# Patient Record
Sex: Female | Born: 1965 | Race: White | Hispanic: No | Marital: Married | Smoking: Never smoker
Health system: Southern US, Community
[De-identification: ages and names within clinical notes are randomized; demographics above are authoritative.]

## PROBLEM LIST (undated history)

## (undated) DIAGNOSIS — F419 Anxiety disorder, unspecified: Secondary | ICD-10-CM

## (undated) DIAGNOSIS — E119 Type 2 diabetes mellitus without complications: Secondary | ICD-10-CM

## (undated) DIAGNOSIS — L409 Psoriasis, unspecified: Secondary | ICD-10-CM

## (undated) DIAGNOSIS — K219 Gastro-esophageal reflux disease without esophagitis: Secondary | ICD-10-CM

## (undated) DIAGNOSIS — F32A Depression, unspecified: Secondary | ICD-10-CM

## (undated) DIAGNOSIS — F329 Major depressive disorder, single episode, unspecified: Secondary | ICD-10-CM

## (undated) HISTORY — DX: Type 2 diabetes mellitus without complications: E11.9

## (undated) HISTORY — PX: TUBAL LIGATION: SHX77

## (undated) HISTORY — DX: Depression, unspecified: F32.A

## (undated) HISTORY — PX: TONSILLECTOMY: SUR1361

## (undated) HISTORY — DX: Psoriasis, unspecified: L40.9

## (undated) HISTORY — PX: ENDOMETRIAL ABLATION: SHX621

## (undated) HISTORY — PX: CARPAL TUNNEL RELEASE: SHX101

## (undated) HISTORY — DX: Major depressive disorder, single episode, unspecified: F32.9

---
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
## 2005-08-02 ENCOUNTER — Ambulatory Visit: Payer: Self-pay

## 2006-08-14 ENCOUNTER — Ambulatory Visit: Payer: Self-pay

## 2007-11-07 ENCOUNTER — Ambulatory Visit: Payer: Self-pay | Admitting: Internal Medicine

## 2007-11-14 ENCOUNTER — Ambulatory Visit: Payer: Self-pay | Admitting: Internal Medicine

## 2007-12-06 ENCOUNTER — Other Ambulatory Visit: Payer: Self-pay

## 2007-12-06 ENCOUNTER — Emergency Department: Payer: Self-pay | Admitting: Emergency Medicine

## 2007-12-08 ENCOUNTER — Ambulatory Visit: Payer: Self-pay | Admitting: Internal Medicine

## 2008-01-07 ENCOUNTER — Ambulatory Visit: Payer: Self-pay | Admitting: Internal Medicine

## 2008-02-07 ENCOUNTER — Ambulatory Visit: Payer: Self-pay | Admitting: Internal Medicine

## 2008-03-09 ENCOUNTER — Ambulatory Visit: Payer: Self-pay | Admitting: Internal Medicine

## 2008-04-08 ENCOUNTER — Ambulatory Visit: Payer: Self-pay | Admitting: Internal Medicine

## 2008-05-09 ENCOUNTER — Ambulatory Visit: Payer: Self-pay | Admitting: Internal Medicine

## 2008-07-09 ENCOUNTER — Ambulatory Visit: Payer: Self-pay | Admitting: Internal Medicine

## 2008-07-26 ENCOUNTER — Ambulatory Visit: Payer: Self-pay | Admitting: Internal Medicine

## 2008-08-09 ENCOUNTER — Ambulatory Visit: Payer: Self-pay | Admitting: Internal Medicine

## 2008-09-06 ENCOUNTER — Ambulatory Visit: Payer: Self-pay | Admitting: Internal Medicine

## 2008-10-27 ENCOUNTER — Ambulatory Visit: Payer: Self-pay

## 2008-12-07 ENCOUNTER — Ambulatory Visit: Payer: Self-pay | Admitting: Internal Medicine

## 2008-12-14 ENCOUNTER — Ambulatory Visit: Payer: Self-pay | Admitting: Internal Medicine

## 2009-01-06 ENCOUNTER — Ambulatory Visit: Payer: Self-pay | Admitting: Internal Medicine

## 2009-03-09 ENCOUNTER — Ambulatory Visit: Payer: Self-pay | Admitting: Internal Medicine

## 2009-03-25 ENCOUNTER — Ambulatory Visit: Payer: Self-pay | Admitting: Internal Medicine

## 2009-04-08 ENCOUNTER — Ambulatory Visit: Payer: Self-pay | Admitting: Internal Medicine

## 2009-06-08 ENCOUNTER — Ambulatory Visit: Payer: Self-pay | Admitting: Internal Medicine

## 2009-06-17 ENCOUNTER — Ambulatory Visit: Payer: Self-pay | Admitting: Internal Medicine

## 2009-07-09 ENCOUNTER — Ambulatory Visit: Payer: Self-pay | Admitting: Internal Medicine

## 2009-08-09 ENCOUNTER — Ambulatory Visit: Payer: Self-pay | Admitting: Internal Medicine

## 2009-11-01 ENCOUNTER — Ambulatory Visit: Payer: Self-pay

## 2009-11-06 ENCOUNTER — Ambulatory Visit: Payer: Self-pay | Admitting: Internal Medicine

## 2009-11-22 ENCOUNTER — Ambulatory Visit: Payer: Self-pay | Admitting: Internal Medicine

## 2010-04-22 ENCOUNTER — Emergency Department: Payer: Self-pay | Admitting: Emergency Medicine

## 2010-05-22 ENCOUNTER — Ambulatory Visit: Payer: Self-pay | Admitting: Internal Medicine

## 2010-06-08 ENCOUNTER — Ambulatory Visit: Payer: Self-pay | Admitting: Internal Medicine

## 2010-07-09 HISTORY — PX: LAPAROSCOPIC VAGINAL HYSTERECTOMY WITH SALPINGO OOPHORECTOMY: SHX6681

## 2010-07-09 HISTORY — PX: COLPOSCOPY: SHX161

## 2011-03-21 ENCOUNTER — Ambulatory Visit: Payer: Self-pay | Admitting: Internal Medicine

## 2011-03-21 ENCOUNTER — Ambulatory Visit: Payer: Self-pay

## 2011-03-27 ENCOUNTER — Ambulatory Visit: Payer: Self-pay | Admitting: Unknown Physician Specialty

## 2011-03-29 ENCOUNTER — Ambulatory Visit: Payer: Self-pay | Admitting: Unknown Physician Specialty

## 2011-04-03 ENCOUNTER — Ambulatory Visit: Payer: Self-pay

## 2011-04-05 LAB — PATHOLOGY REPORT

## 2011-04-09 ENCOUNTER — Ambulatory Visit: Payer: Self-pay | Admitting: Internal Medicine

## 2011-05-10 ENCOUNTER — Ambulatory Visit: Payer: Self-pay | Admitting: Internal Medicine

## 2011-05-29 ENCOUNTER — Ambulatory Visit: Payer: Self-pay

## 2011-06-05 ENCOUNTER — Inpatient Hospital Stay: Payer: Self-pay

## 2011-06-07 LAB — PATHOLOGY REPORT

## 2011-09-27 ENCOUNTER — Ambulatory Visit: Payer: Self-pay | Admitting: Unknown Physician Specialty

## 2016-01-27 ENCOUNTER — Other Ambulatory Visit: Payer: Self-pay | Admitting: Obstetrics & Gynecology

## 2016-01-27 DIAGNOSIS — Z1231 Encounter for screening mammogram for malignant neoplasm of breast: Secondary | ICD-10-CM

## 2016-02-09 ENCOUNTER — Ambulatory Visit: Payer: Self-pay

## 2016-02-21 ENCOUNTER — Other Ambulatory Visit: Payer: Self-pay | Admitting: Obstetrics & Gynecology

## 2016-02-21 ENCOUNTER — Ambulatory Visit
Admission: RE | Admit: 2016-02-21 | Discharge: 2016-02-21 | Disposition: A | Discharge status: Home / Self Care | Payer: Managed Care, Other (non HMO) | Source: Ambulatory Visit | Attending: Obstetrics & Gynecology | Admitting: Obstetrics & Gynecology

## 2016-02-21 DIAGNOSIS — Z1231 Encounter for screening mammogram for malignant neoplasm of breast: Secondary | ICD-10-CM | POA: Insufficient documentation

## 2016-12-31 ENCOUNTER — Other Ambulatory Visit: Payer: Self-pay | Admitting: Obstetrics and Gynecology

## 2016-12-31 ENCOUNTER — Other Ambulatory Visit: Payer: Self-pay | Admitting: Obstetrics & Gynecology

## 2016-12-31 DIAGNOSIS — Z1231 Encounter for screening mammogram for malignant neoplasm of breast: Secondary | ICD-10-CM

## 2017-01-23 ENCOUNTER — Ambulatory Visit (INDEPENDENT_AMBULATORY_CARE_PROVIDER_SITE_OTHER): Payer: Managed Care, Other (non HMO) | Admitting: Obstetrics and Gynecology

## 2017-01-23 ENCOUNTER — Encounter: Payer: Self-pay | Admitting: Obstetrics and Gynecology

## 2017-01-23 VITALS — BP 110/76 | HR 76 | Ht 61.0 in | Wt 171.0 lb

## 2017-01-23 DIAGNOSIS — Z124 Encounter for screening for malignant neoplasm of cervix: Secondary | ICD-10-CM | POA: Diagnosis not present

## 2017-01-23 DIAGNOSIS — Z01419 Encounter for gynecological examination (general) (routine) without abnormal findings: Secondary | ICD-10-CM

## 2017-01-23 DIAGNOSIS — Z1211 Encounter for screening for malignant neoplasm of colon: Secondary | ICD-10-CM

## 2017-01-23 DIAGNOSIS — Z1239 Encounter for other screening for malignant neoplasm of breast: Secondary | ICD-10-CM

## 2017-01-23 DIAGNOSIS — Z1231 Encounter for screening mammogram for malignant neoplasm of breast: Secondary | ICD-10-CM | POA: Diagnosis not present

## 2017-01-23 MED ORDER — ESTROGENS, CONJUGATED 0.625 MG/GM VA CREA: 1.0000 | TOPICAL_CREAM | VAGINAL | 3 refills | Status: DC

## 2017-01-23 NOTE — Progress Notes (Addendum)
Patient ID: Kristine Stephens, female   DOB: Apr 02, 1966, 51 y.o.   MRN: [id number]     Gynecology Annual Exam  PCP: System, Pcp Not In  Chief Complaint:  Chief Complaint  Patient presents with  . Gynecologic Exam    History of Present Illness:Patient is a 51 y.o. G3P3 presents for annual exam. The patient has no complaints today.   LMP: No LMP recorded. Patient has had a hysterectomy.  The patient is sexually active. She denies dyspareunia.  The patient does perform self breast exams.  There is no notable family history of breast or ovarian cancer in her family.  The patient wears seatbelts: yes.   The patient has regular exercise: not asked.    The patient denies current symptoms of depression.     Review of Systems: Review of Systems  Constitutional: Negative for chills and fever.  HENT: Negative for congestion.   Respiratory: Negative for cough and shortness of breath.   Cardiovascular: Negative for chest pain and palpitations.  Gastrointestinal: Negative for abdominal pain, constipation, diarrhea, heartburn, nausea and vomiting.  Genitourinary: Negative for dysuria, frequency and urgency.  Skin: Negative for itching and rash.  Neurological: Negative for dizziness and headaches.  Endo/Heme/Allergies: Negative for polydipsia.  Psychiatric/Behavioral: Negative for depression.    Past Medical History:  History reviewed. No pertinent past medical history.  Past Surgical History:  Past Surgical History:  Procedure Laterality Date  . ABDOMINAL HYSTERECTOMY    . ASD REPAIR  1985  . CARPAL TUNNEL RELEASE    . CESAREAN SECTION      Gynecologic History:  No LMP recorded. Patient has had a hysterectomy. Last Pap: Results were:01/20/2016 ASCUS with POSITIVE high risk HPV  Colposcopy negative Last mammogram: 02/21/2016 Results were: BI-RAD I Obstetric History: G3P3  Family History:  Family History  Problem Relation Age of Onset  . Breast cancer Paternal Grandmother      Social History:  Social History   Social History  . Marital status: Married    Spouse name: N/A  . Number of children: N/A  . Years of education: N/A   Occupational History  . Not on file.   Social History Main Topics  . Smoking status: Never Smoker  . Smokeless tobacco: Never Used  . Alcohol use No  . Drug use: No  . Sexual activity: Yes    Partners: Male    Birth control/ protection: None   Other Topics Concern  . Not on file   Social History Narrative  . No narrative on file    Allergies:  No Known Allergies  Medications: Prior to Admission medications   Medication Sig Start Date End Date Taking? Authorizing Provider  Cholecalciferol (VITAMIN D-1000 MAX ST) 1000 units tablet Take by mouth.   Yes [provider]  clonazePAM (KLONOPIN) 0.5 MG tablet TK 1 T PO QHS PRF INSOMNIA 12/23/16  Yes [provider]  FLUoxetine (PROZAC) 20 MG capsule Take by mouth.   Yes [provider]  folic acid (FOLVITE) 1 MG tablet  01/22/17  Yes [provider]  methotrexate 50 MG/2ML injection Inject into the skin. 10/30/16  Yes [provider]  naproxen sodium (ANAPROX) 220 MG tablet Take by mouth.   Yes [provider]  omeprazole (PRILOSEC) 20 MG capsule TK ONE C PO  ONE TO TWO TIMES D 11/06/16  Yes [provider]    Physical Exam Vitals: Blood pressure 110/76, pulse 76, height 5\' 1"  (1.549 m), weight 171 lb (77.6  kg).  General: NAD HEENT: normocephalic, anicteric Thyroid: no enlargement, no palpable nodules Pulmonary: No increased work of breathing, CTAB Cardiovascular: RRR, distal pulses 2+ Breast: Breast symmetrical, no tenderness, no palpable nodules or masses, no skin or nipple retraction present, no nipple discharge.  No axillary or supraclavicular lymphadenopathy. Abdomen: NABS, soft, non-tender, non-distended.  Umbilicus without lesions.  No hepatomegaly, splenomegaly or masses palpable. No evidence of hernia   Genitourinary:  External: Normal external female genitalia.  Normal urethral meatus, normal  Bartholin's and Skene's glands.    Vagina: Normal vaginal mucosa, no evidence of prolapse.    Cervix: Grossly normal in appearance, no bleeding  Uterus: surgically absent  Adnexa: ovaries non-enlarged, no adnexal masses  Rectal: deferred  Lymphatic: no evidence of inguinal lymphadenopathy Extremities: no edema, erythema, or tenderness Neurologic: Grossly intact Psychiatric: mood appropriate, affect full  Female chaperone present for pelvic and breast  portions of the physical exam    Assessment: 51 y.o. G3P3 No problem-specific Assessment & Plan notes found for this encounter.   Plan: Problem List Items Addressed This Visit    None    Visit Diagnoses    Breast screening    -  Primary   Relevant Orders   MM DIGITAL SCREENING BILATERAL   Screening for malignant neoplasm of cervix       Relevant Orders   PapIG, HPV, rfx 16/18   Encounter for gynecological examination without abnormal finding       Relevant Orders   PapIG, HPV, rfx 16/18   Colon cancer screening       Relevant Orders   Ambulatory referral to Gastroenterology      1) Mammogram - recommend yearly screening mammogram.  Mammogram Was ordered today  2) STI screening was not offered  3) ASCCP guidelines and rational discussed.  Patient opts for every 3 years screening interval - supracervical hysterectomy, BSO for AUB adenomyosis on final path  4) Osteoporosis  - per USPTF routine screening DEXA at age 51  5) Routine healthcare maintenance including cholesterol, diabetes screening discussed managed by PCP  6) Colonoscopy  Screening recommended starting at age 51 for average risk individuals, age 51 for individuals deemed at increased risk (including African Americans) and recommended to continue until age 51.  For patient age 51-51 individualized approach is recommended.  Gold standard screening is via colonoscopy,  Cologuard screening is an acceptable alternative for patient unwilling or unable to undergo colonoscopy.  "Colorectal cancer screening for average?risk adults: 2018 guideline update from the American Cancer Society"CA: A Cancer Journal for Clinicians: Dec 05, 2016  - ordered  7) Follow up 1 year for routine annual

## 2017-01-23 NOTE — Patient Instructions (Signed)
Preventive Care 40-64 Years, Female Preventive care refers to lifestyle choices and visits with your health care provider that can promote health and wellness. What does preventive care include?  A yearly physical exam. This is also called an annual well check.  Dental exams once or twice a year.  Routine eye exams. Ask your health care provider how often you should have your eyes checked.  Personal lifestyle choices, including: ? Daily care of your teeth and gums. ? Regular physical activity. ? Eating a healthy diet. ? Avoiding tobacco and drug use. ? Limiting alcohol use. ? Practicing safe sex. ? Taking low-dose aspirin daily starting at age 58. ? Taking vitamin and mineral supplements as recommended by your health care provider. What happens during an annual well check? The services and screenings done by your health care provider during your annual well check will depend on your age, overall health, lifestyle risk factors, and family history of disease. Counseling Your health care provider may ask you questions about your:  Alcohol use.  Tobacco use.  Drug use.  Emotional well-being.  Home and relationship well-being.  Sexual activity.  Eating habits.  Work and work Statistician.  Method of birth control.  Menstrual cycle.  Pregnancy history.  Screening You may have the following tests or measurements:  Height, weight, and BMI.  Blood pressure.  Lipid and cholesterol levels. These may be checked every 5 years, or more frequently if you are over 81 years old.  Skin check.  Lung cancer screening. You may have this screening every year starting at age 78 if you have a 30-pack-year history of smoking and currently smoke or have quit within the past 15 years.  Fecal occult blood test (FOBT) of the stool. You may have this test every year starting at age 65.  Flexible sigmoidoscopy or colonoscopy. You may have a sigmoidoscopy every 5 years or a colonoscopy  every 10 years starting at age 30.  Hepatitis C blood test.  Hepatitis B blood test.  Sexually transmitted disease (STD) testing.  Diabetes screening. This is done by checking your blood sugar (glucose) after you have not eaten for a while (fasting). You may have this done every 1-3 years.  Mammogram. This may be done every 1-2 years. Talk to your health care provider about when you should start having regular mammograms. This may depend on whether you have a family history of breast cancer.  BRCA-related cancer screening. This may be done if you have a family history of breast, ovarian, tubal, or peritoneal cancers.  Pelvic exam and Pap test. This may be done every 3 years starting at age 80. Starting at age 36, this may be done every 5 years if you have a Pap test in combination with an HPV test.  Bone density scan. This is done to screen for osteoporosis. You may have this scan if you are at high risk for osteoporosis.  Discuss your test results, treatment options, and if necessary, the need for more tests with your health care provider. Vaccines Your health care provider may recommend certain vaccines, such as:  Influenza vaccine. This is recommended every year.  Tetanus, diphtheria, and acellular pertussis (Tdap, Td) vaccine. You may need a Td booster every 10 years.  Varicella vaccine. You may need this if you have not been vaccinated.  Zoster vaccine. You may need this after age 5.  Measles, mumps, and rubella (MMR) vaccine. You may need at least one dose of MMR if you were born in  1957 or later. You may also need a second dose.  Pneumococcal 13-valent conjugate (PCV13) vaccine. You may need this if you have certain conditions and were not previously vaccinated.  Pneumococcal polysaccharide (PPSV23) vaccine. You may need one or two doses if you smoke cigarettes or if you have certain conditions.  Meningococcal vaccine. You may need this if you have certain  conditions.  Hepatitis A vaccine. You may need this if you have certain conditions or if you travel or work in places where you may be exposed to hepatitis A.  Hepatitis B vaccine. You may need this if you have certain conditions or if you travel or work in places where you may be exposed to hepatitis B.  Haemophilus influenzae type b (Hib) vaccine. You may need this if you have certain conditions.  Talk to your health care provider about which screenings and vaccines you need and how often you need them. This information is not intended to replace advice given to you by your health care provider. Make sure you discuss any questions you have with your health care provider. Document Released: 07/22/2015 Document Revised: 03/14/2016 Document Reviewed: 04/26/2015 Elsevier Interactive Patient Education  2017 [employer].

## 2017-01-30 LAB — PAPIG, HPV, RFX 16/18
HPV Genotype, 16: NEGATIVE
HPV Genotype, 18: NEGATIVE
HPV, high-risk: POSITIVE — AB
PAP Smear Comment: 0

## 2017-02-04 ENCOUNTER — Telehealth: Payer: Self-pay | Admitting: Obstetrics and Gynecology

## 2017-02-04 NOTE — Telephone Encounter (Signed)
Pt is returning call from DR. Staebler . Please advise

## 2017-02-04 NOTE — Telephone Encounter (Signed)
Please call.

## 2017-02-04 NOTE — Telephone Encounter (Signed)
Unable to reach left voicemail to call office at [phone]

## 2017-02-05 NOTE — Telephone Encounter (Signed)
Pt is returning Missed call

## 2017-02-06 ENCOUNTER — Telehealth: Payer: Self-pay | Admitting: Obstetrics and Gynecology

## 2017-02-06 NOTE — Telephone Encounter (Signed)
-----   Message from Malachy Mood, MD sent at 02/05/2017  4:47 PM EDT ----- Regarding: Colposcopy Needs colposcopy in the next 1-6 weeks

## 2017-02-06 NOTE — Telephone Encounter (Signed)
Pt is schedule 03/15/17

## 2017-02-07 ENCOUNTER — Telehealth: Payer: Self-pay

## 2017-02-07 ENCOUNTER — Other Ambulatory Visit: Payer: Self-pay

## 2017-02-07 DIAGNOSIS — Z1211 Encounter for screening for malignant neoplasm of colon: Secondary | ICD-10-CM

## 2017-02-07 NOTE — Telephone Encounter (Signed)
Gastroenterology Pre-Procedure Review  Request Date: 04/11/17 Requesting Physician: Dr. Vicente Males  PATIENT REVIEW QUESTIONS: The patient responded to the following health history questions as indicated:    1. Are you having any GI issues? no 2. Do you have a personal history of Polyps? no 3. Do you have a family history of Colon Cancer or Polyps? no 4. Diabetes Mellitus? no 5. Joint replacements in the past 12 months?no 6. Major health problems in the past 3 months?no 7. Any artificial heart valves, MVP, or defibrillator?no    MEDICATIONS & ALLERGIES:    Patient reports the following regarding taking any anticoagulation/antiplatelet therapy:   Plavix, Coumadin, Eliquis, Xarelto, Lovenox, Pradaxa, Brilinta, or Effient? no Aspirin? no  Patient confirms/reports the following medications:  Current Outpatient Prescriptions  Medication Sig Dispense Refill  . Cholecalciferol (VITAMIN D-1000 MAX ST) 1000 units tablet Take by mouth.    . clonazePAM (KLONOPIN) 0.5 MG tablet TK 1 T PO QHS PRF INSOMNIA  3  . conjugated estrogens (PREMARIN) vaginal cream Place 1 Applicatorful vaginally 2 (two) times a week. 30 g 3  . FLUoxetine (PROZAC) 20 MG capsule Take by mouth.    . folic acid (FOLVITE) 1 MG tablet     . methotrexate 50 MG/2ML injection Inject into the skin.    . naproxen sodium (ANAPROX) 220 MG tablet Take by mouth.    Marland Kitchen omeprazole (PRILOSEC) 20 MG capsule TK ONE C PO  ONE TO TWO TIMES D  2   No current facility-administered medications for this visit.     Patient confirms/reports the following allergies:  No Known Allergies  No orders of the defined types were placed in this encounter.   AUTHORIZATION INFORMATION Primary Insurance: 1D#: Group #:  Secondary Insurance: 1D#: Group #:  SCHEDULE INFORMATION: Date: 04/11/17 Time: Location:[facility]

## 2017-02-21 ENCOUNTER — Ambulatory Visit
Admission: RE | Admit: 2017-02-21 | Discharge: 2017-02-21 | Disposition: A | Discharge status: Home / Self Care | Payer: Managed Care, Other (non HMO) | Source: Ambulatory Visit | Attending: Obstetrics and Gynecology | Admitting: Obstetrics and Gynecology

## 2017-02-21 DIAGNOSIS — Z1231 Encounter for screening mammogram for malignant neoplasm of breast: Secondary | ICD-10-CM | POA: Insufficient documentation

## 2017-03-15 ENCOUNTER — Ambulatory Visit (INDEPENDENT_AMBULATORY_CARE_PROVIDER_SITE_OTHER): Payer: Managed Care, Other (non HMO) | Admitting: Obstetrics and Gynecology

## 2017-03-15 ENCOUNTER — Encounter: Payer: Self-pay | Admitting: Obstetrics and Gynecology

## 2017-03-15 VITALS — BP 116/70 | HR 79 | Wt 170.0 lb

## 2017-03-15 DIAGNOSIS — R87810 Cervical high risk human papillomavirus (HPV) DNA test positive: Secondary | ICD-10-CM

## 2017-03-15 NOTE — Progress Notes (Signed)
   GYNECOLOGY CLINIC COLPOSCOPY PROCEDURE NOTE  51 y.o. G3P3 here for colposcopy for normal cytology positive HPV pap smear on 01/2017. Discussed underlying role for HPV infection in the development of cervical dysplasia, its natural history and progression/regression, need for surveillance.  Is the patient  pregnant: No LMP: No LMP recorded. Patient has had a hysterectomy. Smoking status:  Metrics: Intervention Frequency ACO  Documented Smoking Status Yearly  Screened one or more times in 24 months  Cessation Counseling or  Active cessation medication Past 24 months  Past 24 months   Guideline developer: UpToDate (See UpToDate for funding source) Date Released: 2014  Patient given informed consent, signed copy in the chart, time out was performed.  The patient was position in dorsal lithotomy position. Speculum was placed the cervix was visualized.   After application of acetic acid colposcopic inspection of the cervix was undertaken.   Colposcopy adequate, full visualization of transformation zone: Yes no visible lesions; corresponding biopsies obtained.   ECC specimen obtained:  No  All specimens were labeled and sent to pathology.   Patient was given post procedure instructions.  Will follow up pathology and manage accordingly.  Routine preventative health maintenance measures emphasized.  OBGyn Exam  Malachy Mood, MD, [facility], [facility]

## 2017-03-19 LAB — PATHOLOGY

## 2017-03-27 ENCOUNTER — Telehealth: Payer: Self-pay | Admitting: Obstetrics and Gynecology

## 2017-03-27 NOTE — Telephone Encounter (Signed)
Please call back

## 2017-03-27 NOTE — Telephone Encounter (Signed)
Pt is calling due to missed call about her Colpo results. Please advise cb# [phone]

## 2017-04-11 ENCOUNTER — Encounter
Admission: RE | Disposition: A | Discharge status: Home / Self Care | Payer: Self-pay | Source: Ambulatory Visit | Attending: Gastroenterology

## 2017-04-11 ENCOUNTER — Ambulatory Visit
Admission: RE | Admit: 2017-04-11 | Discharge: 2017-04-11 | Disposition: A | Discharge status: Home / Self Care | Payer: Managed Care, Other (non HMO) | Source: Ambulatory Visit | Attending: Gastroenterology | Admitting: Gastroenterology

## 2017-04-11 ENCOUNTER — Ambulatory Visit: Payer: Managed Care, Other (non HMO) | Admitting: Anesthesiology

## 2017-04-11 ENCOUNTER — Encounter: Payer: Self-pay | Admitting: Anesthesiology

## 2017-04-11 DIAGNOSIS — F419 Anxiety disorder, unspecified: Secondary | ICD-10-CM | POA: Insufficient documentation

## 2017-04-11 DIAGNOSIS — Z1211 Encounter for screening for malignant neoplasm of colon: Secondary | ICD-10-CM | POA: Insufficient documentation

## 2017-04-11 DIAGNOSIS — K640 First degree hemorrhoids: Secondary | ICD-10-CM | POA: Diagnosis not present

## 2017-04-11 DIAGNOSIS — Z791 Long term (current) use of non-steroidal anti-inflammatories (NSAID): Secondary | ICD-10-CM | POA: Insufficient documentation

## 2017-04-11 DIAGNOSIS — K219 Gastro-esophageal reflux disease without esophagitis: Secondary | ICD-10-CM | POA: Insufficient documentation

## 2017-04-11 DIAGNOSIS — K635 Polyp of colon: Secondary | ICD-10-CM | POA: Insufficient documentation

## 2017-04-11 DIAGNOSIS — D123 Benign neoplasm of transverse colon: Secondary | ICD-10-CM

## 2017-04-11 DIAGNOSIS — Z79899 Other long term (current) drug therapy: Secondary | ICD-10-CM | POA: Diagnosis not present

## 2017-04-11 HISTORY — DX: Gastro-esophageal reflux disease without esophagitis: K21.9

## 2017-04-11 HISTORY — DX: Anxiety disorder, unspecified: F41.9

## 2017-04-11 HISTORY — PX: COLONOSCOPY WITH PROPOFOL: SHX5780

## 2017-04-11 SURGERY — COLONOSCOPY WITH PROPOFOL
Anesthesia: General

## 2017-04-11 MED ORDER — MIDAZOLAM HCL 2 MG/2ML IJ SOLN
INTRAMUSCULAR | Status: AC
  Filled 2017-04-11: qty 2

## 2017-04-11 MED ORDER — PROPOFOL 500 MG/50ML IV EMUL
INTRAVENOUS | Status: AC
  Filled 2017-04-11: qty 50

## 2017-04-11 MED ORDER — FENTANYL CITRATE (PF) 100 MCG/2ML IJ SOLN
INTRAMUSCULAR | Status: AC
  Filled 2017-04-11: qty 2

## 2017-04-11 MED ORDER — FENTANYL CITRATE (PF) 100 MCG/2ML IJ SOLN
INTRAMUSCULAR | Status: DC | PRN
  Administered 2017-04-11: 50 ug via INTRAVENOUS

## 2017-04-11 MED ORDER — SODIUM CHLORIDE 0.9 % IV SOLN
INTRAVENOUS | Status: DC
  Administered 2017-04-11: 08:00:00 via INTRAVENOUS

## 2017-04-11 MED ORDER — MIDAZOLAM HCL 2 MG/2ML IJ SOLN
INTRAMUSCULAR | Status: DC | PRN
  Administered 2017-04-11: 2 mg via INTRAVENOUS

## 2017-04-11 MED ORDER — PROPOFOL 500 MG/50ML IV EMUL
INTRAVENOUS | Status: DC | PRN
  Administered 2017-04-11: 120 ug/kg/min via INTRAVENOUS

## 2017-04-11 NOTE — Anesthesia Procedure Notes (Signed)
Performed by: Vaughan Sine Pre-anesthesia Checklist: Patient identified, Suction available, Emergency Drugs available, Patient being monitored and Timeout performed Patient Re-evaluated:Patient Re-evaluated prior to induction Oxygen Delivery Method: Nasal cannula Preoxygenation: Pre-oxygenation with 100% oxygen Induction Type: IV induction Placement Confirmation: positive ETCO2 and CO2 detector

## 2017-04-11 NOTE — H&P (Signed)
  Jonathon Bellows MD [address], [place], [place] [address] Phone: [phone] Fax : [phone]  Primary Care Physician:  [facility] Primary Gastroenterologist:  Dr. Jonathon Bellows   Pre-Procedure History & Physical: HPI:  Kristine Stephens is a 51 y.o. female is here for an colonoscopy.   Past Medical History:  Diagnosis Date  . Anxiety   . GERD (gastroesophageal reflux disease)     Past Surgical History:  Procedure Laterality Date  . ABDOMINAL HYSTERECTOMY    . ASD REPAIR  1985  . CARPAL TUNNEL RELEASE    . CESAREAN SECTION    . TONSILLECTOMY      Prior to Admission medications   Medication Sig Start Date End Date Taking? Authorizing Provider  clonazePAM (KLONOPIN) 0.5 MG tablet Take 0.5 mg by mouth at bedtime.   Yes [provider]  conjugated estrogens (PREMARIN) vaginal cream Place 1 Applicatorful vaginally 2 (two) times a week. 01/24/17  Yes Malachy Mood, MD  FLUoxetine (PROZAC) 20 MG capsule Take by mouth.   Yes [provider]  omeprazole (PRILOSEC) 20 MG capsule TK ONE C PO  ONE TO TWO TIMES D 11/06/16  Yes [provider]  Cholecalciferol (VITAMIN D-1000 MAX ST) 1000 units tablet Take by mouth.    [provider]  folic acid (FOLVITE) 1 MG tablet  01/22/17   [provider]  naproxen sodium (ANAPROX) 220 MG tablet Take by mouth.    [provider]    Allergies as of 02/07/2017  . (No Known Allergies)    Family History  Problem Relation Age of Onset  . Breast cancer Paternal Grandmother     Social History   Social History  . Marital status: Married    Spouse name: N/A  . Number of children: N/A  . Years of education: N/A   Occupational History  . Not on file.   Social History Main Topics  . Smoking status: Never Smoker  . Smokeless tobacco: Never Used  . Alcohol use No  . Drug use: No  . Sexual activity: Yes    Partners: Male    Birth control/ protection: None   Other  Topics Concern  . Not on file   Social History Narrative  . No narrative on file    Review of Systems: See HPI, otherwise negative ROS  Physical Exam: Pulse 78   Temp (!) 96.8 F (36 C) (Tympanic)   Resp 16   Ht 5' (1.524 m)   Wt 163 lb (73.9 kg)   SpO2 99%   BMI 31.83 kg/m  General:   Alert,  pleasant and cooperative in NAD Head:  Normocephalic and atraumatic. Neck:  Supple; no masses or thyromegaly. Lungs:  Clear throughout to auscultation.    Heart:  Regular rate and rhythm. Abdomen:  Soft, nontender and nondistended. Normal bowel sounds, without guarding, and without rebound.   Neurologic:  Alert and  oriented x4;  grossly normal neurologically.  Impression/Plan: Kristine Stephens is here for an colonoscopy to be performed for Screening colonoscopy average risk    Risks, benefits, limitations, and alternatives regarding  colonoscopy have been reviewed with the patient.  Questions have been answered.  All parties agreeable.   Jonathon Bellows, MD  04/11/2017, 8:11 AM

## 2017-04-11 NOTE — Anesthesia Preprocedure Evaluation (Signed)
Anesthesia Evaluation  Patient identified by MRN, date of birth, ID band Patient awake    Reviewed: Allergy & Precautions, NPO status , Patient's Chart, lab work & pertinent test results  History of Anesthesia Complications Negative for: history of anesthetic complications  Airway Mallampati: II  TM Distance: >3 FB Neck ROM: Full    Dental no notable dental hx.    Pulmonary neg pulmonary ROS, neg sleep apnea, neg COPD,    breath sounds clear to auscultation- rhonchi (-) wheezing      Cardiovascular Exercise Tolerance: Good (-) hypertension(-) CAD and (-) Past MI  Rhythm:Regular Rate:Normal - Systolic murmurs and - Diastolic murmurs    Neuro/Psych Anxiety negative neurological ROS     GI/Hepatic Neg liver ROS, GERD  ,  Endo/Other  negative endocrine ROSneg diabetes  Renal/GU negative Renal ROS     Musculoskeletal negative musculoskeletal ROS (+)   Abdominal (+) + obese,   Peds  Hematology negative hematology ROS (+)   Anesthesia Other Findings Past Medical History: No date: Anxiety No date: GERD (gastroesophageal reflux disease)   Reproductive/Obstetrics                             Anesthesia Physical Anesthesia Plan  ASA: II  Anesthesia Plan: General   Post-op Pain Management:    Induction: Intravenous  PONV Risk Score and Plan: 2 and Propofol infusion  Airway Management Planned: Natural Airway  Additional Equipment:   Intra-op Plan:   Post-operative Plan:   Informed Consent: I have reviewed the patients History and Physical, chart, labs and discussed the procedure including the risks, benefits and alternatives for the proposed anesthesia with the patient or authorized representative who has indicated his/her understanding and acceptance.   Dental advisory given  Plan Discussed with: CRNA and Anesthesiologist  Anesthesia Plan Comments:         Anesthesia Quick  Evaluation

## 2017-04-11 NOTE — Anesthesia Postprocedure Evaluation (Signed)
Anesthesia Post Note  Patient: Kristine Stephens  Procedure(s) Performed: COLONOSCOPY WITH PROPOFOL (N/A )  Patient location during evaluation: Endoscopy Anesthesia Type: General Level of consciousness: awake and alert and oriented Pain management: pain level controlled Vital Signs Assessment: post-procedure vital signs reviewed and stable Respiratory status: spontaneous breathing, nonlabored ventilation and respiratory function stable Cardiovascular status: blood pressure returned to baseline and stable Postop Assessment: no signs of nausea or vomiting Anesthetic complications: no     Last Vitals:  Vitals:   04/11/17 0900 04/11/17 0910  BP: 110/68 110/64  Pulse: 75 71  Resp: 12 18  Temp:    SpO2: 99% 99%    Last Pain:  Vitals:   04/11/17 0840  TempSrc: Tympanic                 Numair Masden

## 2017-04-11 NOTE — Op Note (Signed)
[facility] Gastroenterology Patient Name: Kristine Stephens Procedure Date: 04/11/2017 8:13 AM MRN: [id number] Account #: [id number] Date of Birth: [birth year] Admit Type: Ambulatory Age: 51 Room: [facility] ENDO ROOM 3 Gender: Female Note Status: Finalized Procedure:            Colonoscopy Indications:          Screening for colorectal malignant neoplasm Providers:            Jonathon Bellows MD, MD Referring MD:         Lavera Guise, MD (Referring MD) Medicines:            Monitored Anesthesia Care Complications:        No immediate complications. Procedure:            Pre-Anesthesia Assessment:                       - Prior to the procedure, a History and Physical was                        performed, and patient medications, allergies and                        sensitivities were reviewed. The patient's tolerance of                        previous anesthesia was reviewed.                       - ASA Grade Assessment: II - A patient with mild                        systemic disease.                       After obtaining informed consent, the colonoscope was                        passed under direct vision. Throughout the procedure,                        the patient's blood pressure, pulse, and oxygen                        saturations were monitored continuously. The                        Colonoscope was introduced through the anus and                        advanced to the the cecum, identified by the                        appendiceal orifice, IC valve and transillumination.                        The colonoscopy was performed with ease. The patient                        tolerated the procedure well. The quality of the bowel  preparation was good. Findings:      The perianal and digital rectal examinations were normal.      Two sessile polyps were found in the transverse colon. The polyps were 3       to 4 mm in size. These polyps were removed  with a cold biopsy forceps.       Resection and retrieval were complete.      The exam was otherwise without abnormality on direct and retroflexion       views.      Non-bleeding internal hemorrhoids were found during retroflexion. The       hemorrhoids were small and Grade I (internal hemorrhoids that do not       prolapse).      The exam was otherwise without abnormality on direct and retroflexion       views. Impression:           - Two 3 to 4 mm polyps in the transverse colon, removed                        with a cold biopsy forceps. Resected and retrieved.                       - The examination was otherwise normal on direct and                        retroflexion views.                       - Non-bleeding internal hemorrhoids.                       - The examination was otherwise normal on direct and                        retroflexion views. Recommendation:       - Discharge patient to home (with escort).                       - Resume previous diet.                       - Continue present medications.                       - Await pathology results.                       - Repeat colonoscopy in 5-10 years for surveillance                        based on pathology results. Procedure Code(s):    --- Professional ---                       [phone], Colonoscopy, flexible; with biopsy, single or                        multiple Diagnosis Code(s):    --- Professional ---                       Z12.11, Encounter for screening for malignant neoplasm  of colon                       D12.3, Benign neoplasm of transverse colon (hepatic                        flexure or splenic flexure)                       K64.0, First degree hemorrhoids CPT copyright 2016 American Medical Association. All rights reserved. The codes documented in this report are preliminary and upon coder review may  be revised to meet current compliance requirements. Jonathon Bellows, MD Jonathon Bellows MD,  MD 04/11/2017 8:42:31 AM This report has been signed electronically. Number of Addenda: 0 Note Initiated On: 04/11/2017 8:13 AM Scope Withdrawal Time: 0 hours 12 minutes 56 seconds  Total Procedure Duration: 0 hours 24 minutes 25 seconds       [facility]

## 2017-04-11 NOTE — Anesthesia Post-op Follow-up Note (Signed)
Anesthesia QCDR form completed.        

## 2017-04-11 NOTE — Transfer of Care (Signed)
Immediate Anesthesia Transfer of Care Note  Patient: Kristine Stephens  Procedure(s) Performed: COLONOSCOPY WITH PROPOFOL (N/A )  Patient Location: PACU  Anesthesia Type:General  Level of Consciousness: awake and sedated  Airway & Oxygen Therapy: Patient Spontanous Breathing and Patient connected to nasal cannula oxygen  Post-op Assessment: Report given to RN and Post -op Vital signs reviewed and stable  Post vital signs: Reviewed and stable  Last Vitals:  Vitals:   04/11/17 0723  Pulse: 78  Resp: 16  Temp: (!) 36 C  SpO2: 99%    Last Pain:  Vitals:   04/11/17 0723  TempSrc: Tympanic         Complications: No apparent anesthesia complications

## 2017-04-12 ENCOUNTER — Encounter: Payer: Self-pay | Admitting: Gastroenterology

## 2017-04-12 LAB — SURGICAL PATHOLOGY

## 2017-04-12 NOTE — Progress Notes (Signed)
done

## 2017-04-15 ENCOUNTER — Encounter: Payer: Self-pay | Admitting: Gastroenterology

## 2017-07-10 ENCOUNTER — Ambulatory Visit: Payer: Self-pay | Admitting: Nurse Practitioner

## 2017-07-16 ENCOUNTER — Other Ambulatory Visit: Payer: Self-pay

## 2017-07-17 ENCOUNTER — Other Ambulatory Visit: Payer: Self-pay | Admitting: Nurse Practitioner

## 2017-07-17 DIAGNOSIS — F411 Generalized anxiety disorder: Secondary | ICD-10-CM

## 2017-07-17 MED ORDER — CLONAZEPAM 0.5 MG PO TABS: 0.5000 mg | ORAL_TABLET | Freq: Every day | ORAL | 3 refills | Status: DC

## 2017-07-17 NOTE — Progress Notes (Signed)
Renewed rx for clonazepam 0.5mg  at bedtime as needed

## 2017-07-26 ENCOUNTER — Telehealth: Payer: Self-pay

## 2017-07-26 NOTE — Telephone Encounter (Signed)
Let pt know that it was called in and told her to call her pharmacy before to make sure they have it.

## 2017-07-26 NOTE — Telephone Encounter (Signed)
-----   Message from Ronnell Freshwater, NP sent at 07/24/2017  2:02 PM EST ----- Regarding: RE: rx refill Sent in rx for her clonazepam to her pharmacy with 3 refills on 07/17/2017   ----- Message ----- From: Waynard Edwards Sent: 07/24/2017  12:57 PM To: Ronnell Freshwater, NP Subject: rx refill                                      Pt called saying that she will be running out of her clonazapem before appt  08/26/17. The office had to move her appt from January to that date. Can you call her in a refill?

## 2017-08-26 ENCOUNTER — Ambulatory Visit: Payer: Managed Care, Other (non HMO) | Admitting: Nurse Practitioner

## 2017-08-26 ENCOUNTER — Encounter: Payer: Self-pay | Admitting: Nurse Practitioner

## 2017-08-26 VITALS — BP 106/68 | HR 80 | Resp 16 | Ht 60.0 in | Wt 170.0 lb

## 2017-08-26 DIAGNOSIS — F411 Generalized anxiety disorder: Secondary | ICD-10-CM | POA: Diagnosis not present

## 2017-08-26 DIAGNOSIS — K219 Gastro-esophageal reflux disease without esophagitis: Secondary | ICD-10-CM

## 2017-08-26 MED ORDER — FLUOXETINE HCL 20 MG PO CAPS: 20.0000 mg | ORAL_CAPSULE | Freq: Every day | ORAL | 3 refills | Status: DC

## 2017-08-26 NOTE — Progress Notes (Signed)
[facility] [place], [place] [address]  Internal MEDICINE  Office Visit Note  Patient Name: Kristine Stephens  [id number]  [id number]  Date of Service: 09/04/2017  Chief Complaint  Patient presents with  . Depression    Depression         This is a chronic problem.  The current episode started more than 1 year ago.   The onset quality is gradual.   The problem occurs intermittently.  Progression since onset: stable.  Associated symptoms include fatigue, irritable and sad.  Associated symptoms include no headaches and no suicidal ideas.     The symptoms are aggravated by work stress and family issues.  Past treatments include SSRIs - Selective serotonin reuptake inhibitors and other medications.  Compliance with treatment is good.  Previous treatment provided moderate relief.  Risk factors include stress.    Pt is here for routine follow up.    Current Medication: Outpatient Encounter Medications as of 08/26/2017  Medication Sig  . clonazePAM (KLONOPIN) 0.5 MG tablet Take 1 tablet (0.5 mg total) by mouth at bedtime.  . conjugated estrogens (PREMARIN) vaginal cream Place 1 Applicatorful vaginally 2 (two) times a week.  Marland Kitchen FLUoxetine (PROZAC) 20 MG capsule Take 1 capsule (20 mg total) by mouth daily.  . naproxen sodium (ANAPROX) 220 MG tablet Take by mouth.  Marland Kitchen omeprazole (PRILOSEC) 20 MG capsule TK ONE C PO  ONE TO TWO TIMES D  . [DISCONTINUED] FLUoxetine (PROZAC) 20 MG capsule Take by mouth.  . Cholecalciferol (VITAMIN D-1000 MAX ST) 1000 units tablet Take by mouth.  . [DISCONTINUED] folic acid (FOLVITE) 1 MG tablet    No facility-administered encounter medications on file as of 08/26/2017.     Surgical History: Past Surgical History:  Procedure Laterality Date  . ABDOMINAL HYSTERECTOMY    . ASD REPAIR  1985  . CARPAL TUNNEL RELEASE    . CESAREAN SECTION    . COLONOSCOPY WITH PROPOFOL N/A 04/11/2017   Procedure: COLONOSCOPY WITH PROPOFOL;  Surgeon: Jonathon Bellows, MD;  Location: [facility] ENDOSCOPY;  Service: Gastroenterology;  Laterality: N/A;  . TONSILLECTOMY      Medical History: Past Medical History:  Diagnosis Date  . Anxiety   . GERD (gastroesophageal reflux disease)     Family History: Family History  Problem Relation Age of Onset  . Breast cancer Paternal Grandmother     Social History   Socioeconomic History  . Marital status: Married    Spouse name: Not on file  . Number of children: Not on file  . Years of education: Not on file  . Highest education level: Not on file  Social Needs  . Financial resource strain: Not on file  . Food insecurity - worry: Not on file  . Food insecurity - inability: Not on file  . Transportation needs - medical: Not on file  . Transportation needs - non-medical: Not on file  Occupational History  . Not on file  Tobacco Use  . Smoking status: Never Smoker  . Smokeless tobacco: Never Used  Substance and Sexual Activity  . Alcohol use: No  . Drug use: No  . Sexual activity: Yes    Partners: Male    Birth control/protection: None  Other Topics Concern  . Not on file  Social History Narrative  . Not on file      Review of Systems  Constitutional: Positive for fatigue. Negative for activity change, chills and unexpected weight change.  HENT: Negative for  congestion, postnasal drip, rhinorrhea, sneezing and sore throat.   Eyes: Negative for redness.  Respiratory: Negative for cough, chest tightness and shortness of breath.   Cardiovascular: Negative for chest pain and palpitations.  Gastrointestinal: Negative for abdominal pain, constipation, diarrhea, nausea and vomiting.  Endocrine: Negative for cold intolerance, heat intolerance, polydipsia and polyuria.  Genitourinary: Negative.  Negative for dysuria and frequency.  Musculoskeletal: Negative for arthralgias, back pain, joint swelling and neck pain.  Skin: Negative for rash.  Allergic/Immunologic: Negative for environmental  allergies.  Neurological: Negative for tremors, numbness and headaches.  Hematological: Negative for adenopathy. Does not bruise/bleed easily.  Psychiatric/Behavioral: Positive for behavioral problems (Depression) and depression. Negative for sleep disturbance and suicidal ideas. The patient is nervous/anxious.     Today's Vitals   08/26/17 1556  BP: 106/68  Pulse: 80  Resp: 16  SpO2: 96%  Weight: 170 lb (77.1 kg)  Height: 5' (1.524 m)  ;  Physical Exam  Constitutional: She is oriented to person, place, and time. She appears well-developed and well-nourished. She is irritable. No distress.  HENT:  Head: Normocephalic and atraumatic.  Mouth/Throat: Oropharynx is clear and moist. No oropharyngeal exudate.  Eyes: EOM are normal. Pupils are equal, round, and reactive to light.  Neck: Normal range of motion. Neck supple. No JVD present. No tracheal deviation present. No thyromegaly present.  Cardiovascular: Normal rate, regular rhythm and normal heart sounds. Exam reveals no gallop and no friction rub.  No murmur heard. Pulmonary/Chest: Effort normal and breath sounds normal. No respiratory distress. She has no wheezes. She has no rales. She exhibits no tenderness.  Abdominal: Soft. Bowel sounds are normal. There is no tenderness.  Musculoskeletal: Normal range of motion.  Lymphadenopathy:    She has no cervical adenopathy.  Neurological: She is alert and oriented to person, place, and time. No cranial nerve deficit.  Skin: Skin is warm and dry. She is not diaphoretic.  Psychiatric: She has a normal mood and affect. Her behavior is normal. Judgment and thought content normal.  Nursing note and vitals reviewed.   Assessment/Plan:  1. GAD (generalized anxiety disorder) Stable. Continue fluoxetine daily. Use clonazepam as needed and as prescribed  - FLUoxetine (PROZAC) 20 MG capsule; Take 1 capsule (20 mg total) by mouth daily.  Dispense: 30 capsule; Refill: 3  2. Gastroesophageal  reflux disease without esophagitis Stable. Continue omeprazole as prescribed .  General Counseling: emelda kohlbeck understanding of the findings of todays visit and agrees with plan of treatment. I have discussed any further diagnostic evaluation that may be needed or ordered today. We also reviewed her medications today. she has been encouraged to call the office with any questions or concerns that should arise related to todays visit.   This patient was seen by Leretha Pol, FNP- C in Collaboration with Dr Lavera Guise as a part of collaborative care agreement    Meds ordered this encounter  Medications  . FLUoxetine (PROZAC) 20 MG capsule    Sig: Take 1 capsule (20 mg total) by mouth daily.    Dispense:  30 capsule    Refill:  3    Patient taking 1 capsule daily.    Order Specific Question:   Supervising Provider    Answer:   Lavera Guise [[id number]]    Time spent: 61 Minutes     Dr Lavera Guise Internal medicine

## 2017-09-04 DIAGNOSIS — K219 Gastro-esophageal reflux disease without esophagitis: Secondary | ICD-10-CM | POA: Insufficient documentation

## 2017-09-04 DIAGNOSIS — F411 Generalized anxiety disorder: Secondary | ICD-10-CM | POA: Insufficient documentation

## 2017-10-01 ENCOUNTER — Telehealth: Payer: Self-pay

## 2017-10-01 ENCOUNTER — Other Ambulatory Visit: Payer: Self-pay | Admitting: Nurse Practitioner

## 2017-10-01 NOTE — Telephone Encounter (Signed)
Called patient and lmom advising her of podiatry appt. Kc podiatry dr Vickki Muff 10/10/17  2:15 [phone]

## 2017-10-02 LAB — COMPREHENSIVE METABOLIC PANEL WITH GFR
ALT: 17 IU/L (ref 0–32)
AST: 26 IU/L (ref 0–40)
Albumin/Globulin Ratio: 1.8 (ref 1.2–2.2)
Albumin: 4.5 g/dL (ref 3.5–5.5)
Alkaline Phosphatase: 96 IU/L (ref 39–117)
BUN/Creatinine Ratio: 18 (ref 9–23)
BUN: 12 mg/dL (ref 6–24)
Bilirubin Total: 0.2 mg/dL (ref 0.0–1.2)
CO2: 24 mmol/L (ref 20–29)
Calcium: 9.1 mg/dL (ref 8.7–10.2)
Chloride: 100 mmol/L (ref 96–106)
Creatinine, Ser: 0.66 mg/dL (ref 0.57–1.00)
GFR calc Af Amer: 118 mL/min/1.73
GFR calc non Af Amer: 103 mL/min/1.73
Globulin, Total: 2.5 g/dL (ref 1.5–4.5)
Glucose: 104 mg/dL — ABNORMAL HIGH (ref 65–99)
Potassium: 4.3 mmol/L (ref 3.5–5.2)
Sodium: 139 mmol/L (ref 134–144)
Total Protein: 7 g/dL (ref 6.0–8.5)

## 2017-10-02 LAB — LIPID PANEL W/O CHOL/HDL RATIO
Cholesterol, Total: 195 mg/dL (ref 100–199)
HDL: 42 mg/dL
LDL Calculated: 117 mg/dL — ABNORMAL HIGH (ref 0–99)
Triglycerides: 180 mg/dL — ABNORMAL HIGH (ref 0–149)
VLDL Cholesterol Cal: 36 mg/dL (ref 5–40)

## 2017-10-02 LAB — CBC
Hematocrit: 37.4 % (ref 34.0–46.6)
Hemoglobin: 12 g/dL (ref 11.1–15.9)
MCH: 25.4 pg — ABNORMAL LOW (ref 26.6–33.0)
MCHC: 32.1 g/dL (ref 31.5–35.7)
MCV: 79 fL (ref 79–97)
Platelets: 240 x10E3/uL (ref 150–379)
RBC: 4.73 x10E6/uL (ref 3.77–5.28)
RDW: 14.3 % (ref 12.3–15.4)
WBC: 4.2 x10E3/uL (ref 3.4–10.8)

## 2017-10-02 LAB — IRON AND TIBC
Iron Saturation: 12 % — ABNORMAL LOW (ref 15–55)
Iron: 40 ug/dL (ref 27–159)
Total Iron Binding Capacity: 347 ug/dL (ref 250–450)
UIBC: 307 ug/dL (ref 131–425)

## 2017-10-02 LAB — B12 AND FOLATE PANEL
Folate: >20 ng/mL (ref >3.0)
Vitamin B-12: 542 pg/mL (ref 232–1245)

## 2017-10-02 LAB — T4, FREE: Free T4: 0.92 ng/dL (ref 0.82–1.77)

## 2017-10-02 LAB — TSH: TSH: 3.47 u[IU]/mL (ref 0.450–4.500)

## 2017-10-02 LAB — VITAMIN D 25 HYDROXY (VIT D DEFICIENCY, FRACTURES): Vit D, 25-Hydroxy: 19.3 ng/mL — ABNORMAL LOW (ref 30.0–100.0)

## 2017-10-02 LAB — FERRITIN: Ferritin: 58 ng/mL (ref 15–150)

## 2017-11-04 ENCOUNTER — Other Ambulatory Visit: Payer: Self-pay | Admitting: Nurse Practitioner

## 2017-11-04 DIAGNOSIS — F411 Generalized anxiety disorder: Secondary | ICD-10-CM

## 2017-11-04 MED ORDER — CLONAZEPAM 0.5 MG PO TABS: 0.5000 mg | ORAL_TABLET | Freq: Every day | ORAL | 1 refills | Status: DC

## 2017-11-08 ENCOUNTER — Other Ambulatory Visit: Payer: Self-pay | Admitting: Nurse Practitioner

## 2017-11-08 ENCOUNTER — Telehealth: Payer: Self-pay

## 2017-11-08 DIAGNOSIS — E559 Vitamin D deficiency, unspecified: Secondary | ICD-10-CM

## 2017-11-08 DIAGNOSIS — F411 Generalized anxiety disorder: Secondary | ICD-10-CM

## 2017-11-08 MED ORDER — CLONAZEPAM 0.5 MG PO TABS: 0.5000 mg | ORAL_TABLET | Freq: Every day | ORAL | 1 refills | Status: DC

## 2017-11-08 MED ORDER — ERGOCALCIFEROL 1.25 MG (50000 UT) PO CAPS: 50000.0000 [IU] | ORAL_CAPSULE | ORAL | 5 refills | Status: DC

## 2017-11-08 NOTE — Telephone Encounter (Signed)
Please let the patient know that vitamin d was a bit low on labs. I sent in rx for drisdol which is weekly for next 6 months. Her cholesterol was just a little high. I would like to send her out prudent diet information and recheck levels in one year. Other labs were good. I also renewed clonazepam. Thanks

## 2017-11-08 NOTE — Telephone Encounter (Signed)
Spoke to pt and advised lab results and new medications sent to pharmacy.  Advised pt that we were mailing out a prudent diet for her cholesterol.  Clonazepam sent to pharmacy.  dbs

## 2017-11-08 NOTE — Progress Notes (Signed)
Started drisdol 50000iu weekly for next 6 months and renewed clonazepam.

## 2017-12-24 ENCOUNTER — Encounter: Payer: Self-pay | Admitting: Nurse Practitioner

## 2017-12-24 ENCOUNTER — Ambulatory Visit: Payer: Managed Care, Other (non HMO) | Admitting: Nurse Practitioner

## 2017-12-24 VITALS — BP 125/75 | HR 72 | Resp 16 | Ht 60.0 in | Wt 169.8 lb

## 2017-12-24 DIAGNOSIS — M1990 Unspecified osteoarthritis, unspecified site: Secondary | ICD-10-CM

## 2017-12-24 DIAGNOSIS — F411 Generalized anxiety disorder: Secondary | ICD-10-CM | POA: Diagnosis not present

## 2017-12-24 MED ORDER — ETODOLAC 400 MG PO TABS: 400.0000 mg | ORAL_TABLET | Freq: Two times a day (BID) | ORAL | 2 refills | Status: DC

## 2017-12-24 MED ORDER — CLONAZEPAM 0.5 MG PO TABS: 0.5000 mg | ORAL_TABLET | Freq: Every day | ORAL | 2 refills | Status: DC

## 2017-12-24 MED ORDER — DULOXETINE HCL 20 MG PO CPEP: 20.0000 mg | ORAL_CAPSULE | Freq: Every day | ORAL | 3 refills | Status: DC

## 2017-12-24 NOTE — Progress Notes (Signed)
[facility] [place], [place] [address]  Internal MEDICINE  Office Visit Note  Patient Name: Kristine Stephens  [id number]  [id number]  Date of Service: 12/24/2017   Pt is here for routine follow up.   Chief Complaint  Patient presents with  . Anxiety    70month follow up    The patient states that she has had some fatigue and lack of motivation. Sometimes she lacks focus and concentration. Feels sad, this is not always for good reason.  Feels like it is related to taking prozac. She has been on this medication for years and is afraid that maybe it isn't working as well. The patient also states that she has joint pain in multiple areas. Most severe in the neck and shoulders. She also has tenderness in her head, at the bottom of her scalp. Has noted a flare of psoriasis in her scalp,       Current Medication: Outpatient Encounter Medications as of 12/24/2017  Medication Sig  . Cholecalciferol (VITAMIN D-1000 MAX ST) 1000 units tablet Take by mouth.  . clonazePAM (KLONOPIN) 0.5 MG tablet Take 1 tablet (0.5 mg total) by mouth at bedtime.  . conjugated estrogens (PREMARIN) vaginal cream Place 1 Applicatorful vaginally 2 (two) times a week.  . ergocalciferol (DRISDOL) 50000 units capsule Take 1 capsule (50,000 Units total) by mouth once a week.  Marland Kitchen FLUoxetine (PROZAC) 20 MG capsule Take 1 capsule (20 mg total) by mouth daily.  . naproxen sodium (ANAPROX) 220 MG tablet Take by mouth.  Marland Kitchen omeprazole (PRILOSEC) 20 MG capsule TK ONE C PO  ONE TO TWO TIMES D  . [DISCONTINUED] clonazePAM (KLONOPIN) 0.5 MG tablet Take 1 tablet (0.5 mg total) by mouth at bedtime.  . [DISCONTINUED] clonazePAM (KLONOPIN) 0.5 MG tablet Take 1 tablet (0.5 mg total) by mouth at bedtime.  . DULoxetine (CYMBALTA) 20 MG capsule Take 1 capsule (20 mg total) by mouth daily.  Marland Kitchen etodolac (LODINE) 400 MG tablet Take 1 tablet (400 mg total) by mouth 2 (two) times daily.   No facility-administered  encounter medications on file as of 12/24/2017.     Surgical History: Past Surgical History:  Procedure Laterality Date  . ABDOMINAL HYSTERECTOMY    . ASD REPAIR  1985  . CARPAL TUNNEL RELEASE    . CESAREAN SECTION    . COLONOSCOPY WITH PROPOFOL N/A 04/11/2017   Procedure: COLONOSCOPY WITH PROPOFOL;  Surgeon: Jonathon Bellows, MD;  Location: [facility] ENDOSCOPY;  Service: Gastroenterology;  Laterality: N/A;  . TONSILLECTOMY      Medical History: Past Medical History:  Diagnosis Date  . Anxiety   . GERD (gastroesophageal reflux disease)     Family History: Family History  Problem Relation Age of Onset  . Breast cancer Paternal Grandmother     Social History   Socioeconomic History  . Marital status: Married    Spouse name: Not on file  . Number of children: Not on file  . Years of education: Not on file  . Highest education level: Not on file  Occupational History  . Not on file  Social Needs  . Financial resource strain: Not on file  . Food insecurity:    Worry: Not on file    Inability: Not on file  . Transportation needs:    Medical: Not on file    Non-medical: Not on file  Tobacco Use  . Smoking status: Never Smoker  . Smokeless tobacco: Never Used  Substance and Sexual Activity  .  Alcohol use: No  . Drug use: No  . Sexual activity: Yes    Partners: Male    Birth control/protection: None  Lifestyle  . Physical activity:    Days per week: Not on file    Minutes per session: Not on file  . Stress: Not on file  Relationships  . Social connections:    Talks on phone: Not on file    Gets together: Not on file    Attends religious service: Not on file    Active member of club or organization: Not on file    Attends meetings of clubs or organizations: Not on file    Relationship status: Not on file  . Intimate partner violence:    Fear of current or ex partner: Not on file    Emotionally abused: Not on file    Physically abused: Not on file    Forced sexual  activity: Not on file  Other Topics Concern  . Not on file  Social History Narrative  . Not on file      Review of Systems  Constitutional: Positive for fatigue. Negative for activity change, chills and unexpected weight change.  HENT: Negative for congestion, postnasal drip, rhinorrhea, sneezing and sore throat.   Eyes: Negative for redness.  Respiratory: Negative for cough, chest tightness and shortness of breath.   Cardiovascular: Negative for chest pain and palpitations.  Gastrointestinal: Negative for abdominal pain, constipation, diarrhea, nausea and vomiting.  Endocrine: Negative for cold intolerance, heat intolerance, polydipsia, polyphagia and polyuria.  Genitourinary: Negative.  Negative for dysuria and frequency.  Musculoskeletal: Positive for arthralgias, back pain, myalgias and neck pain. Negative for joint swelling.       General aches. Hurts in her neck and shoulders  Skin: Negative for rash.       Psoriasis flare on scalp, close to base of the neck.   Allergic/Immunologic: Negative for environmental allergies.  Neurological: Positive for headaches. Negative for tremors and numbness.  Hematological: Positive for adenopathy. Does not bruise/bleed easily.  Psychiatric/Behavioral: Positive for behavioral problems (Depression). Negative for sleep disturbance and suicidal ideas. The patient is nervous/anxious.     Today's Vitals   12/24/17 0937  BP: 125/75  Pulse: 72  Resp: 16  SpO2: 97%  Weight: 169 lb 12.8 oz (77 kg)  Height: 5' (1.524 m)    Physical Exam  Constitutional: She is oriented to person, place, and time. She appears well-developed and well-nourished. No distress.  HENT:  Head: Normocephalic and atraumatic.  Mouth/Throat: Oropharynx is clear and moist. No oropharyngeal exudate.  Eyes: Pupils are equal, round, and reactive to light. EOM are normal.  Neck: Normal range of motion. Neck supple. No JVD present. No tracheal deviation present. No thyromegaly  present.  Cardiovascular: Normal rate, regular rhythm and normal heart sounds. Exam reveals no gallop and no friction rub.  No murmur heard. Pulmonary/Chest: Effort normal and breath sounds normal. No respiratory distress. She has no wheezes. She has no rales. She exhibits no tenderness.  Abdominal: Soft. Bowel sounds are normal. There is no tenderness.  Musculoskeletal: Normal range of motion.  Lymphadenopathy:    She has no cervical adenopathy.  Neurological: She is alert and oriented to person, place, and time. No cranial nerve deficit.  Skin: Skin is warm and dry. She is not diaphoretic.  Psychiatric: She has a normal mood and affect. Her behavior is normal. Judgment and thought content normal.  Nursing note and vitals reviewed.  Assessment/Plan:  1. Inflammatory osteoarthritis New  prescription for etodolac 400mg . Take up to twice daily as needed for pain/inflammation. Will start duloxetine to help prevent and treat pain.  - etodolac (LODINE) 400 MG tablet; Take 1 tablet (400 mg total) by mouth 2 (two) times daily.  Dispense: 60 tablet; Refill: 2 - DULoxetine (CYMBALTA) 20 MG capsule; Take 1 capsule (20 mg total) by mouth daily.  Dispense: 30 capsule; Refill: 3  2. GAD (generalized anxiety disorder) Start duloxetine 20mg  daily. Wean off prozac. Written instruction provided for weaning. May continue clonazepam 0.5mg  at bedtime as needed. New prescription provided today. - DULoxetine (CYMBALTA) 20 MG capsule; Take 1 capsule (20 mg total) by mouth daily.  Dispense: 30 capsule; Refill: 3 - clonazePAM (KLONOPIN) 0.5 MG tablet; Take 1 tablet (0.5 mg total) by mouth at bedtime.  Dispense: 30 tablet; Refill: 2   General Counseling: quincey nored understanding of the findings of todays visit and agrees with plan of treatment. I have discussed any further diagnostic evaluation that may be needed or ordered today. We also reviewed her medications today. she has been encouraged to call the  office with any questions or concerns that should arise related to todays visit.    Counseling:  This patient was seen by Leretha Pol, FNP- C in Collaboration with Dr Lavera Guise as a part of collaborative care agreement  Meds ordered this encounter  Medications  . etodolac (LODINE) 400 MG tablet    Sig: Take 1 tablet (400 mg total) by mouth 2 (two) times daily.    Dispense:  60 tablet    Refill:  2    Order Specific Question:   Supervising Provider    Answer:   Lavera Guise [[id number]]  . DULoxetine (CYMBALTA) 20 MG capsule    Sig: Take 1 capsule (20 mg total) by mouth daily.    Dispense:  30 capsule    Refill:  3    Will wean off prozac    Order Specific Question:   Supervising Provider    Answer:   Lavera Guise [[id number]]  . DISCONTD: clonazePAM (KLONOPIN) 0.5 MG tablet    Sig: Take 1 tablet (0.5 mg total) by mouth at bedtime.    Dispense:  30 tablet    Refill:  2    Order Specific Question:   Supervising Provider    Answer:   Lavera Guise [[id number]]  . clonazePAM (KLONOPIN) 0.5 MG tablet    Sig: Take 1 tablet (0.5 mg total) by mouth at bedtime.    Dispense:  30 tablet    Refill:  2    Order Specific Question:   Supervising Provider    Answer:   Lavera Guise [[id number]]    Time spent: 72 Minutes      Dr Lavera Guise Internal medicine

## 2018-01-23 ENCOUNTER — Encounter: Payer: Self-pay | Admitting: Nurse Practitioner

## 2018-01-23 ENCOUNTER — Ambulatory Visit (INDEPENDENT_AMBULATORY_CARE_PROVIDER_SITE_OTHER): Payer: Managed Care, Other (non HMO) | Admitting: Nurse Practitioner

## 2018-01-23 VITALS — BP 113/67 | HR 88 | Resp 16 | Ht 60.0 in | Wt 174.6 lb

## 2018-01-23 DIAGNOSIS — M1990 Unspecified osteoarthritis, unspecified site: Secondary | ICD-10-CM

## 2018-01-23 DIAGNOSIS — E668 Other obesity: Secondary | ICD-10-CM | POA: Diagnosis not present

## 2018-01-23 DIAGNOSIS — F411 Generalized anxiety disorder: Secondary | ICD-10-CM

## 2018-01-23 MED ORDER — DULOXETINE HCL 30 MG PO CPEP: 30.0000 mg | ORAL_CAPSULE | Freq: Every day | ORAL | 3 refills | Status: DC

## 2018-01-23 MED ORDER — PHENTERMINE HCL 37.5 MG PO TABS: 37.5000 mg | ORAL_TABLET | Freq: Every day | ORAL | 1 refills | Status: DC

## 2018-01-23 NOTE — Progress Notes (Signed)
[facility] [place], [place] [address]  Internal MEDICINE  Office Visit Note  Patient Name: Kristine Stephens  [id number]  [id number]  Date of Service: 02/15/2018  Chief Complaint  Patient presents with  . Osteoarthritis    1 month follow up    The patient states that she has had some fatigue and lack of motivation. Switched her from prozac to cymbalta 20mg . Gradually, symptoms are improving. Fatigue is a little better. Energy is a little improved. Reports less joint pain. Not taking lodine as often. Feels like she is having more good days than bad. Going to chiropractor twice weekly to help with back and foot pain.  She is also concerned about 5 pound weight gain. Has been on appetite suppressant in the past. Has done well with minimal side effects. Would like to try this again to stimulate weight loss.       Current Medication: Outpatient Encounter Medications as of 01/23/2018  Medication Sig  . Cholecalciferol (VITAMIN D-1000 MAX ST) 1000 units tablet Take by mouth.  . clonazePAM (KLONOPIN) 0.5 MG tablet Take 1 tablet (0.5 mg total) by mouth at bedtime.  . DULoxetine (CYMBALTA) 30 MG capsule Take 1 capsule (30 mg total) by mouth daily.  . ergocalciferol (DRISDOL) 50000 units capsule Take 1 capsule (50,000 Units total) by mouth once a week.  . etodolac (LODINE) 400 MG tablet Take 1 tablet (400 mg total) by mouth 2 (two) times daily.  . naproxen sodium (ANAPROX) 220 MG tablet Take by mouth.  Marland Kitchen omeprazole (PRILOSEC) 20 MG capsule TK ONE C PO  ONE TO TWO TIMES D  . [DISCONTINUED] conjugated estrogens (PREMARIN) vaginal cream Place 1 Applicatorful vaginally 2 (two) times a week.  . [DISCONTINUED] DULoxetine (CYMBALTA) 20 MG capsule Take 1 capsule (20 mg total) by mouth daily.  . phentermine (ADIPEX-P) 37.5 MG tablet Take 1 tablet (37.5 mg total) by mouth daily before breakfast.   No facility-administered encounter medications on file as of 01/23/2018.      Surgical History: Past Surgical History:  Procedure Laterality Date  . ABDOMINAL HYSTERECTOMY    . ASD REPAIR  1985  . CARPAL TUNNEL RELEASE    . CESAREAN SECTION    . COLONOSCOPY WITH PROPOFOL N/A 04/11/2017   Procedure: COLONOSCOPY WITH PROPOFOL;  Surgeon: Jonathon Bellows, MD;  Location: [facility] ENDOSCOPY;  Service: Gastroenterology;  Laterality: N/A;  . TONSILLECTOMY      Medical History: Past Medical History:  Diagnosis Date  . Anxiety   . GERD (gastroesophageal reflux disease)     Family History: Family History  Problem Relation Age of Onset  . Breast cancer Paternal Grandmother     Social History   Socioeconomic History  . Marital status: Married    Spouse name: Not on file  . Number of children: Not on file  . Years of education: Not on file  . Highest education level: Not on file  Occupational History  . Not on file  Social Needs  . Financial resource strain: Not on file  . Food insecurity:    Worry: Not on file    Inability: Not on file  . Transportation needs:    Medical: Not on file    Non-medical: Not on file  Tobacco Use  . Smoking status: Never Smoker  . Smokeless tobacco: Never Used  Substance and Sexual Activity  . Alcohol use: No  . Drug use: No  . Sexual activity: Yes    Partners: Male  Birth control/protection: None  Lifestyle  . Physical activity:    Days per week: Not on file    Minutes per session: Not on file  . Stress: Not on file  Relationships  . Social connections:    Talks on phone: Not on file    Gets together: Not on file    Attends religious service: Not on file    Active member of club or organization: Not on file    Attends meetings of clubs or organizations: Not on file    Relationship status: Not on file  . Intimate partner violence:    Fear of current or ex partner: Not on file    Emotionally abused: Not on file    Physically abused: Not on file    Forced sexual activity: Not on file  Other Topics Concern  .  Not on file  Social History Narrative  . Not on file      Review of Systems  Constitutional: Positive for fatigue. Negative for activity change, chills and unexpected weight change.       Five pound weight gain since last visit.   HENT: Negative for congestion, postnasal drip, rhinorrhea, sneezing and sore throat.   Eyes: Negative for redness.  Respiratory: Negative for cough, chest tightness and shortness of breath.   Cardiovascular: Negative for chest pain and palpitations.  Gastrointestinal: Negative for abdominal pain, constipation, diarrhea, nausea and vomiting.  Endocrine: Negative for cold intolerance, heat intolerance, polydipsia, polyphagia and polyuria.  Genitourinary: Negative.  Negative for dysuria and frequency.  Musculoskeletal: Positive for arthralgias, back pain, myalgias and neck pain. Negative for joint swelling.       General aches. Hurts in her neck and shoulders. Improved since her last visit.   Skin: Negative for rash.  Allergic/Immunologic: Negative for environmental allergies.  Neurological: Positive for headaches. Negative for tremors and numbness.  Hematological: Negative for adenopathy. Does not bruise/bleed easily.  Psychiatric/Behavioral: Positive for dysphoric mood. Negative for behavioral problems (Depression), sleep disturbance and suicidal ideas. The patient is nervous/anxious.        Improved    Today's Vitals   01/23/18 1526  BP: 113/67  Pulse: 88  Resp: 16  SpO2: 98%  Weight: 174 lb 9.6 oz (79.2 kg)  Height: 5' (1.524 m)    Physical Exam  Constitutional: She is oriented to person, place, and time. She appears well-developed and well-nourished. No distress.  HENT:  Head: Normocephalic and atraumatic.  Nose: Nose normal.  Mouth/Throat: Oropharynx is clear and moist. No oropharyngeal exudate.  Eyes: Pupils are equal, round, and reactive to light. Conjunctivae and EOM are normal.  Neck: Normal range of motion. Neck supple. No JVD present. No  tracheal deviation present. No thyromegaly present.  Cardiovascular: Normal rate, regular rhythm and normal heart sounds. Exam reveals no gallop and no friction rub.  No murmur heard. Pulmonary/Chest: Effort normal and breath sounds normal. No respiratory distress. She has no wheezes. She has no rales. She exhibits no tenderness.  Abdominal: Soft. Bowel sounds are normal. There is no tenderness.  Musculoskeletal: Normal range of motion.  Lymphadenopathy:    She has no cervical adenopathy.  Neurological: She is alert and oriented to person, place, and time. No cranial nerve deficit.  Skin: Skin is warm and dry. She is not diaphoretic.  Psychiatric: Her speech is normal and behavior is normal. Judgment and thought content normal. Her mood appears anxious. Cognition and memory are normal. She exhibits a depressed mood.  Nursing note and vitals  reviewed.  Assessment/Plan:   1. Inflammatory osteoarthritis Improved. Increase cymbalta to 30mg  daily. Reassess at next visit.  - DULoxetine (CYMBALTA) 30 MG capsule; Take 1 capsule (30 mg total) by mouth daily.  Dispense: 30 capsule; Refill: 3  2. GAD (generalized anxiety disorder) Improved. Increase cymbalta to 30mg  daily. Reassess at next visit.  - DULoxetine (CYMBALTA) 30 MG capsule; Take 1 capsule (30 mg total) by mouth daily.  Dispense: 30 capsule; Refill: 3  3. Moderate obesity Restart phentermine 37.5mg  tablets daily. Recommend 1200-1500 calorie diet. Increase level of physical activity  - phentermine (ADIPEX-P) 37.5 MG tablet; Take 1 tablet (37.5 mg total) by mouth daily before breakfast.  Dispense: 30 tablet; Refill: 1   General Counseling: bernie fobes understanding of the findings of todays visit and agrees with plan of treatment. I have discussed any further diagnostic evaluation that may be needed or ordered today. We also reviewed her medications today. she has been encouraged to call the office with any questions or concerns that  should arise related to todays visit.  Obesity Counseling: Risk Assessment: An assessment of behavioral risk factors was made today and includes lack of exercise sedentary lifestyle, lack of portion control and poor dietary habits.  Risk Modification Advice: She was counseled on portion control guidelines. Restricting daily caloric intake to. . The detrimental long term effects of obesity on her health and ongoing poor compliance was also discussed with the patient.   This patient was seen by Tyler with Dr Lavera Guise as a part of collaborative care agreement   Meds ordered this encounter  Medications  . DULoxetine (CYMBALTA) 30 MG capsule    Sig: Take 1 capsule (30 mg total) by mouth daily.    Dispense:  30 capsule    Refill:  3    Increase dose, but patient will finish current bottle of duloxetine 20mg  first.    Order Specific Question:   Supervising Provider    Answer:   Lavera Guise [place]  . phentermine (ADIPEX-P) 37.5 MG tablet    Sig: Take 1 tablet (37.5 mg total) by mouth daily before breakfast.    Dispense:  30 tablet    Refill:  1    Order Specific Question:   Supervising Provider    Answer:   Lavera Guise [[id number]]    Time spent: 73 Minutes      Dr Lavera Guise Internal medicine

## 2018-02-13 ENCOUNTER — Other Ambulatory Visit: Payer: Self-pay | Admitting: Obstetrics and Gynecology

## 2018-02-15 DIAGNOSIS — E668 Other obesity: Secondary | ICD-10-CM | POA: Insufficient documentation

## 2018-02-27 ENCOUNTER — Other Ambulatory Visit: Payer: Self-pay | Admitting: Obstetrics and Gynecology

## 2018-02-27 ENCOUNTER — Telehealth: Payer: Self-pay

## 2018-02-27 MED ORDER — ESTRADIOL 0.1 MG/GM VA CREA: 1.0000 g | TOPICAL_CREAM | VAGINAL | 3 refills | Status: DC

## 2018-02-27 NOTE — Telephone Encounter (Signed)
Sent!

## 2018-02-27 NOTE — Telephone Encounter (Signed)
Pt called stating she has an appointment with [facility] on 9/12. She is needing a refill on the Premarin, however, the pharmacy states it is $400. They suggested the generic d/t it being cheaper. The name of generic is Estradiol. Pt would like for [facility] to send this in if possible. CB# [phone]

## 2018-03-06 ENCOUNTER — Ambulatory Visit: Payer: Managed Care, Other (non HMO) | Admitting: Nurse Practitioner

## 2018-03-06 ENCOUNTER — Encounter: Payer: Self-pay | Admitting: Nurse Practitioner

## 2018-03-06 VITALS — BP 115/73 | HR 87 | Resp 16 | Ht 60.0 in | Wt 174.0 lb

## 2018-03-06 DIAGNOSIS — E668 Other obesity: Secondary | ICD-10-CM

## 2018-03-06 DIAGNOSIS — B372 Candidiasis of skin and nail: Secondary | ICD-10-CM

## 2018-03-06 DIAGNOSIS — F411 Generalized anxiety disorder: Secondary | ICD-10-CM

## 2018-03-06 MED ORDER — CLOTRIMAZOLE-BETAMETHASONE 1-0.05 % EX CREA: 1.0000 "application " | TOPICAL_CREAM | Freq: Two times a day (BID) | CUTANEOUS | 1 refills | Status: DC

## 2018-03-06 MED ORDER — PHENTERMINE HCL 37.5 MG PO TABS: 37.5000 mg | ORAL_TABLET | Freq: Every day | ORAL | 1 refills | Status: DC

## 2018-03-06 NOTE — Progress Notes (Signed)
[facility] [place], [place] [address]  Internal MEDICINE  Office Visit Note  Patient Name: Kristine Stephens  [id number]  [id number]  Date of Service: 03/16/2018  Chief Complaint  Patient presents with  . Medical Management of Chronic Issues    6wk follow up on medications  . Obesity    on phentermine to help appetite suppressant    The patient states that she has had some fatigue and lack of motivation. Switched to cymbalta 20mg . Gradually, symptoms are improving. Fatigue is a little better. Energy is a little improved. Reports less joint pain. Not taking lodine as often. Feels like she is having more good days than bad. Going to chiropractor twice weekly to help with back and foot pain.  Has been irregularly taking phentermine to help reduce appetite and lose weight. She has maintained her weight. Is watching her calories, keeping them to around 1500 calories per day. Continues to struggle introducing regular exercise into her day.       Current Medication: Outpatient Encounter Medications as of 03/06/2018  Medication Sig  . Cholecalciferol (VITAMIN D-1000 MAX ST) 1000 units tablet Take by mouth.  . clonazePAM (KLONOPIN) 0.5 MG tablet Take 1 tablet (0.5 mg total) by mouth at bedtime.  . DULoxetine (CYMBALTA) 30 MG capsule Take 1 capsule (30 mg total) by mouth daily.  . ergocalciferol (DRISDOL) 50000 units capsule Take 1 capsule (50,000 Units total) by mouth once a week.  . estradiol (ESTRACE VAGINAL) 0.1 MG/GM vaginal cream Place 1 g vaginally 3 (three) times a week.  . etodolac (LODINE) 400 MG tablet Take 1 tablet (400 mg total) by mouth 2 (two) times daily.  . naproxen sodium (ANAPROX) 220 MG tablet Take by mouth.  Marland Kitchen omeprazole (PRILOSEC) 20 MG capsule TK ONE C PO  ONE TO TWO TIMES D  . phentermine (ADIPEX-P) 37.5 MG tablet Take 1 tablet (37.5 mg total) by mouth daily before breakfast.  . [DISCONTINUED] phentermine (ADIPEX-P) 37.5 MG tablet Take 1 tablet  (37.5 mg total) by mouth daily before breakfast.  . clotrimazole-betamethasone (LOTRISONE) cream Apply 1 application topically 2 (two) times daily.   No facility-administered encounter medications on file as of 03/06/2018.     Surgical History: Past Surgical History:  Procedure Laterality Date  . ASD REPAIR  1985  . CARPAL TUNNEL RELEASE    . CESAREAN SECTION    . COLONOSCOPY WITH PROPOFOL N/A 04/11/2017   Procedure: COLONOSCOPY WITH PROPOFOL;  Surgeon: Jonathon Bellows, MD;  Location: [facility] ENDOSCOPY;  Service: Gastroenterology;  Laterality: N/A;  . COLPOSCOPY  2012  . ENDOMETRIAL ABLATION     Failed ablation x2 (Tech difficulties)  . LAPAROSCOPIC VAGINAL HYSTERECTOMY WITH SALPINGO OOPHORECTOMY  2012   Adenomyosis for AUB  . TONSILLECTOMY    . TUBAL LIGATION      Medical History: Past Medical History:  Diagnosis Date  . Anxiety   . Depression   . GERD (gastroesophageal reflux disease)   . Psoriasis     Family History: Family History  Problem Relation Age of Onset  . Breast cancer Paternal Grandmother     Social History   Socioeconomic History  . Marital status: Married    Spouse name: Not on file  . Number of children: Not on file  . Years of education: Not on file  . Highest education level: Not on file  Occupational History  . Not on file  Social Needs  . Financial resource strain: Not on file  . Food  insecurity:    Worry: Not on file    Inability: Not on file  . Transportation needs:    Medical: Not on file    Non-medical: Not on file  Tobacco Use  . Smoking status: Never Smoker  . Smokeless tobacco: Never Used  Substance and Sexual Activity  . Alcohol use: No  . Drug use: No  . Sexual activity: Yes    Partners: Male    Birth control/protection: None  Lifestyle  . Physical activity:    Days per week: Not on file    Minutes per session: Not on file  . Stress: Not on file  Relationships  . Social connections:    Talks on phone: Not on file    Gets  together: Not on file    Attends religious service: Not on file    Active member of club or organization: Not on file    Attends meetings of clubs or organizations: Not on file    Relationship status: Not on file  . Intimate partner violence:    Fear of current or ex partner: Not on file    Emotionally abused: Not on file    Physically abused: Not on file    Forced sexual activity: Not on file  Other Topics Concern  . Not on file  Social History Narrative  . Not on file      Review of Systems  Constitutional: Positive for fatigue. Negative for activity change, chills and unexpected weight change.  HENT: Negative for congestion, postnasal drip, rhinorrhea, sneezing and sore throat.   Eyes: Negative for redness.  Respiratory: Negative for cough, chest tightness and shortness of breath.   Cardiovascular: Negative for chest pain and palpitations.  Gastrointestinal: Negative for abdominal pain, constipation, diarrhea, nausea and vomiting.  Endocrine: Negative for cold intolerance, heat intolerance, polydipsia and polyuria.  Genitourinary: Negative.  Negative for dysuria and frequency.  Musculoskeletal: Negative for arthralgias, back pain, joint swelling and neck pain.  Skin: Positive for rash.  Allergic/Immunologic: Negative for environmental allergies.  Neurological: Negative for tremors, numbness and headaches.  Hematological: Negative for adenopathy. Does not bruise/bleed easily.  Psychiatric/Behavioral: Positive for behavioral problems (Depression). Negative for sleep disturbance and suicidal ideas. The patient is nervous/anxious.     Today's Vitals   03/06/18 1559  BP: 115/73  Pulse: 87  Resp: 16  SpO2: 94%  Weight: 174 lb (78.9 kg)  Height: 5' (1.524 m)    Physical Exam  Constitutional: She is oriented to person, place, and time. She appears well-developed and well-nourished. No distress.  HENT:  Head: Normocephalic and atraumatic.  Nose: Nose normal.  Mouth/Throat:  Oropharynx is clear and moist. No oropharyngeal exudate.  Eyes: Pupils are equal, round, and reactive to light. Conjunctivae and EOM are normal.  Neck: Normal range of motion. Neck supple. No JVD present. No tracheal deviation present. No thyromegaly present.  Cardiovascular: Normal rate, regular rhythm and normal heart sounds. Exam reveals no gallop and no friction rub.  No murmur heard. Pulmonary/Chest: Effort normal and breath sounds normal. No respiratory distress. She has no wheezes. She has no rales. She exhibits no tenderness.  Abdominal: Soft. Bowel sounds are normal. There is no tenderness.  Musculoskeletal: Normal range of motion.  Lymphadenopathy:    She has no cervical adenopathy.  Neurological: She is alert and oriented to person, place, and time. No cranial nerve deficit.  Skin: Skin is warm and dry. Capillary refill takes less than 2 seconds. She is not diaphoretic.  Psychiatric:  Her speech is normal and behavior is normal. Judgment and thought content normal. Her mood appears anxious. Cognition and memory are normal. She exhibits a depressed mood.  Nursing note and vitals reviewed.  Assessment/Plan: 1. Cutaneous candidiasis Use lotrisone cream twice daily when needed.  - clotrimazole-betamethasone (LOTRISONE) cream; Apply 1 application topically 2 (two) times daily.  Dispense: 45 g; Refill: 1  2. GAD (generalized anxiety disorder) Stable. Continue cymbalta daily and use clonazepam as needed and as prescribed.   3. Moderate obesity May continue phentermine 37.5mg  tablets daily. Encouraged her to continue following a 1500 calorie diet. Reviewed importance of regular exercise to help with weight loss.  - phentermine (ADIPEX-P) 37.5 MG tablet; Take 1 tablet (37.5 mg total) by mouth daily before breakfast.  Dispense: 30 tablet; Refill: 1  General Counseling: kasee hantz understanding of the findings of todays visit and agrees with plan of treatment. I have discussed any  further diagnostic evaluation that may be needed or ordered today. We also reviewed her medications today. she has been encouraged to call the office with any questions or concerns that should arise related to todays visit.   There is a liability release in patients' chart. There has been a 10 minute discussion about the side effects including but not limited to elevated blood pressure, anxiety, lack of sleep and dry mouth. Pt understands and will like to start/continue on appetite suppressant at this time. There will be one month RX given at the time of visit with proper follow up. Nova diet plan with restricted calories is given to the pt. Pt understands and agrees with  plan of treatment  This patient was seen by Leretha Pol FNP Collaboration with Dr Lavera Guise as a part of collaborative care agreement    Meds ordered this encounter  Medications  . phentermine (ADIPEX-P) 37.5 MG tablet    Sig: Take 1 tablet (37.5 mg total) by mouth daily before breakfast.    Dispense:  30 tablet    Refill:  1    Order Specific Question:   Supervising Provider    Answer:   Lavera Guise [place]  . clotrimazole-betamethasone (LOTRISONE) cream    Sig: Apply 1 application topically 2 (two) times daily.    Dispense:  45 g    Refill:  1    Order Specific Question:   Supervising Provider    Answer:   Lavera Guise [[id number]]    Time spent: 33 Minutes      Dr Lavera Guise Internal medicine

## 2018-03-16 DIAGNOSIS — B372 Candidiasis of skin and nail: Secondary | ICD-10-CM | POA: Insufficient documentation

## 2018-03-20 ENCOUNTER — Ambulatory Visit (INDEPENDENT_AMBULATORY_CARE_PROVIDER_SITE_OTHER): Payer: Managed Care, Other (non HMO) | Admitting: Obstetrics and Gynecology

## 2018-03-20 ENCOUNTER — Encounter: Payer: Self-pay | Admitting: Obstetrics and Gynecology

## 2018-03-20 ENCOUNTER — Other Ambulatory Visit (HOSPITAL_COMMUNITY)
Admission: RE | Admit: 2018-03-20 | Discharge: 2018-03-20 | Disposition: A | Discharge status: Home / Self Care | Payer: Managed Care, Other (non HMO) | Source: Ambulatory Visit | Attending: Obstetrics and Gynecology | Admitting: Obstetrics and Gynecology

## 2018-03-20 VITALS — BP 108/74 | HR 84 | Ht 60.0 in | Wt 175.0 lb

## 2018-03-20 DIAGNOSIS — Z124 Encounter for screening for malignant neoplasm of cervix: Secondary | ICD-10-CM | POA: Diagnosis present

## 2018-03-20 DIAGNOSIS — Z1231 Encounter for screening mammogram for malignant neoplasm of breast: Secondary | ICD-10-CM

## 2018-03-20 DIAGNOSIS — Z01419 Encounter for gynecological examination (general) (routine) without abnormal findings: Secondary | ICD-10-CM | POA: Diagnosis not present

## 2018-03-20 DIAGNOSIS — Z1239 Encounter for other screening for malignant neoplasm of breast: Secondary | ICD-10-CM

## 2018-03-20 NOTE — Patient Instructions (Signed)
[facility] [address] [place] [address]  [facility] [place]  [address] [place] [address]  Phone: [phone]  

## 2018-03-20 NOTE — Progress Notes (Signed)
Gynecology Annual Exam  PCP: Ronnell Freshwater, NP  Chief Complaint:  Chief Complaint  Patient presents with  . Gynecologic Exam    discuss new vaginal cream    History of Present Illness:Patient is a 52 y.o. G3P3003 presents for annual exam. The patient has no complaints today.   LMP: No LMP recorded. Patient has had a hysterectomy.  The patient is sexually active. She denies dyspareunia.  The patient does perform self breast exams.  There is no notable family history of breast or ovarian cancer in her family.  The patient wears seatbelts: yes.   The patient has regular exercise: not asked.    The patient denies current symptoms of depression.     Review of Systems: Review of Systems  Constitutional: Positive for malaise/fatigue. Negative for chills, diaphoresis, fever and weight loss.  HENT: Negative.   Eyes: Negative.   Respiratory: Negative.   Cardiovascular: Negative.   Gastrointestinal: Negative.   Genitourinary: Negative.   Musculoskeletal: Positive for joint pain. Negative for back pain, falls, myalgias and neck pain.  Skin: Positive for rash. Negative for itching.  Neurological: Negative.   Endo/Heme/Allergies: Negative.   Psychiatric/Behavioral: Negative for depression, hallucinations, memory loss, substance abuse and suicidal ideas. The patient is nervous/anxious. The patient does not have insomnia.     Past Medical History:  Past Medical History:  Diagnosis Date  . Anxiety   . Depression   . GERD (gastroesophageal reflux disease)   . Psoriasis     Past Surgical History:  Past Surgical History:  Procedure Laterality Date  . ASD REPAIR  1985  . CARPAL TUNNEL RELEASE    . CESAREAN SECTION    . COLONOSCOPY WITH PROPOFOL N/A 04/11/2017   Procedure: COLONOSCOPY WITH PROPOFOL;  Surgeon: Jonathon Bellows, MD;  Location: [facility] ENDOSCOPY;  Service: Gastroenterology;  Laterality: N/A;  . COLPOSCOPY  2012  . ENDOMETRIAL ABLATION     Failed ablation x2 (Tech  difficulties)  . LAPAROSCOPIC VAGINAL HYSTERECTOMY WITH SALPINGO OOPHORECTOMY  2012   Adenomyosis for AUB  . TONSILLECTOMY    . TUBAL LIGATION      Gynecologic History:  No LMP recorded. Patient has had a hysterectomy. Last Pap: Results were: 01/24/2017 NIL and HR HPV+ negative colposcopy 03/15/2017 Last mammogram: 02/21/2017 Results were: BI-RAD I  Obstetric History: [id number]  Family History:  Family History  Problem Relation Age of Onset  . Breast cancer Paternal Grandmother 42    Social History:  Social History   Socioeconomic History  . Marital status: Married    Spouse name: Not on file  . Number of children: Not on file  . Years of education: Not on file  . Highest education level: Not on file  Occupational History  . Not on file  Social Needs  . Financial resource strain: Not on file  . Food insecurity:    Worry: Not on file    Inability: Not on file  . Transportation needs:    Medical: Not on file    Non-medical: Not on file  Tobacco Use  . Smoking status: Never Smoker  . Smokeless tobacco: Never Used  Substance and Sexual Activity  . Alcohol use: No  . Drug use: No  . Sexual activity: Yes    Partners: Male    Birth control/protection: None  Lifestyle  . Physical activity:    Days per week: Not on file    Minutes per session: Not on file  . Stress: Not on file  Relationships  . Social connections:    Talks on phone: Not on file    Gets together: Not on file    Attends religious service: Not on file    Active member of club or organization: Not on file    Attends meetings of clubs or organizations: Not on file    Relationship status: Not on file  . Intimate partner violence:    Fear of current or ex partner: Not on file    Emotionally abused: Not on file    Physically abused: Not on file    Forced sexual activity: Not on file  Other Topics Concern  . Not on file  Social History Narrative  . Not on file    Allergies:  No Known  Allergies  Medications: Prior to Admission medications   Medication Sig Start Date End Date Taking? Authorizing Provider  Cholecalciferol (VITAMIN D-1000 MAX ST) 1000 units tablet Take by mouth.   Yes [provider]  clonazePAM (KLONOPIN) 0.5 MG tablet Take 1 tablet (0.5 mg total) by mouth at bedtime. 12/24/17  Yes Boscia, Greer Ee, NP  clotrimazole-betamethasone (LOTRISONE) cream Apply 1 application topically 2 (two) times daily. 03/06/18  Yes Boscia, Greer Ee, NP  DULoxetine (CYMBALTA) 30 MG capsule Take 1 capsule (30 mg total) by mouth daily. 01/23/18  Yes Boscia, Greer Ee, NP  ergocalciferol (DRISDOL) 50000 units capsule Take 1 capsule (50,000 Units total) by mouth once a week. 11/08/17  Yes Boscia, Greer Ee, NP  estradiol (ESTRACE VAGINAL) 0.1 MG/GM vaginal cream Place 1 g vaginally 3 (three) times a week. 02/28/18  Yes Malachy Mood, MD  naproxen sodium (ANAPROX) 220 MG tablet Take by mouth.   Yes [provider]  omeprazole (PRILOSEC) 20 MG capsule TK ONE C PO  ONE TO TWO TIMES D 11/06/16  Yes [provider]  phentermine (ADIPEX-P) 37.5 MG tablet Take 1 tablet (37.5 mg total) by mouth daily before breakfast. 03/06/18  Yes Ronnell Freshwater, NP    Physical Exam Vitals: Blood pressure 108/74, pulse 84, height 5' (1.524 m), weight 175 lb (79.4 kg).  General: NAD HEENT: normocephalic, anicteric Thyroid: no enlargement, no palpable nodules Pulmonary: No increased work of breathing, CTAB Cardiovascular: RRR, distal pulses 2+ Breast: Breast symmetrical, no tenderness, no palpable nodules or masses, no skin or nipple retraction present, no nipple discharge.  No axillary or supraclavicular lymphadenopathy. Abdomen: NABS, soft, non-tender, non-distended.  Umbilicus without lesions.  No hepatomegaly, splenomegaly or masses palpable. No evidence of hernia  Genitourinary:  External: Normal external female genitalia.  Normal urethral meatus, normal Bartholin's and  Skene's glands.    Vagina: Normal vaginal mucosa, no evidence of prolapse.    Cervix: Grossly normal in appearance, no bleeding  Uterus: surgically absent  Adnexa: ovaries non-enlarged, no adnexal masses  Rectal: deferred  Lymphatic: no evidence of inguinal lymphadenopathy Extremities: no edema, erythema, or tenderness Neurologic: Grossly intact Psychiatric: mood appropriate, affect full  Female chaperone present for pelvic and breast  portions of the physical exam     Assessment: 52 y.o. G3P3003 routine annual exam  Plan: Problem List Items Addressed This Visit    None    Visit Diagnoses    Encounter for gynecological examination without abnormal finding    -  Primary   Screening for malignant neoplasm of cervix       Relevant Orders   Cytology - PAP   Breast screening       Relevant Orders   MM 3D SCREEN BREAST  BILATERAL      1) Mammogram - recommend yearly screening mammogram.  Mammogram Was ordered today  2) STI screening  was notoffered and therefore not obtained  3) ASCCP guidelines and rational discussed.  Patient opts for every 3 years screening interval  4) Osteoporosis  - per USPTF routine screening DEXA at age 2  Consider FDA-approved medical therapies in postmenopausal women and men aged 65 years and older, based on the following: a) A hip or vertebral (clinical or morphometric) fracture b) T-score ? -2.5 at the femoral neck or spine after appropriate evaluation to exclude secondary causes C) Low bone mass (T-score between -1.0 and -2.5 at the femoral neck or spine) and a 10-year probability of a hip fracture ? 3% or a 10-year probability of a major osteoporosis-related fracture ? 20% based on the [place]-adapted WHO algorithm   5) Routine healthcare maintenance including cholesterol, diabetes screening labs 10/01/2017 normal  6) Colonoscopy - up to date 04/11/2017  7) Vulvovaginal atrophy - continue estrace.  Has had overall improvement in vasomotor  symptoms.  We discussed WHI study findings in detail.  In the combined estrogen-progesterone arm breast cancer risk was increased by 1.26 (CI of 1.00 to 1.59), coronary heart disease 1.29 (CI 1.02-1.63), stroke risk 1.41 (1.07-1.85), and pulmonary embolism 2.13 (CI 1.39-3.25).  That being said the while statistically significant the actual number of cases attributable are relatively small at an addition 8 cases of breast cancer, 7 more coronary artery event, 8 more strokes, and 8 additional case of pulmonary embolism per 10,000 women.  Study was terminated because of the increased breast cancer risk, this was not seen in the progestin only arm of the study for women without an intact uterus.  In addition it is important to note that HRT also had positive or risk reducing effects, and all cause mortality between the HRT/non-HRT users is not statistically different.  Estrogen-progestin HRT decreased the relative risk of hip fracture 0.66 (CI 0.45-0.98), colorectal cancer 0.63 (0.43-0.92).  Current consensus is to limit dose to the lowest effective dose, and shortest treatment duration possible.  Breast cancer risk appeared to increase after 4 years of use.  Also important to note is that these risk refer to systemic HRT for the treatment of vasomotor symptoms, and do not apply to vaginal preperations with minimal systemic absorption and aimed at treating symptoms of vulvovaginal atrophy.    We briefly touched on findings of WHIMS trial published in 2005 which looked at women 31 year of age or older, and whether HRT was protective against the development of dementia.  The study revealed that HRT actually increased the risk for the development of dementia but was limited by looking only at patients 100 years of age and older.  The subsequent KEEPS trial  In 2012 which looked at HRT in recently postmenopausal women did not show any improvement in cognitive function for women on HRT.  However, there was also no  significant cognitive declines seen in recently postmenopausal women receiving HRT as had previously been shown in the WHIMS trial.    Return in about 1 year (around 03/21/2019) for annual.    Malachy Mood, MD Mosetta Pigeon, [place] 03/20/2018, 10:55 AM

## 2018-03-24 LAB — CYTOLOGY - PAP
Diagnosis: NEGATIVE
HPV: NOT DETECTED

## 2018-04-03 ENCOUNTER — Ambulatory Visit (INDEPENDENT_AMBULATORY_CARE_PROVIDER_SITE_OTHER): Payer: Managed Care, Other (non HMO) | Admitting: Nurse Practitioner

## 2018-04-03 ENCOUNTER — Encounter: Payer: Self-pay | Admitting: Nurse Practitioner

## 2018-04-03 VITALS — BP 123/81 | HR 77 | Resp 16 | Ht 60.0 in | Wt 174.0 lb

## 2018-04-03 DIAGNOSIS — M1990 Unspecified osteoarthritis, unspecified site: Secondary | ICD-10-CM | POA: Diagnosis not present

## 2018-04-03 DIAGNOSIS — L409 Psoriasis, unspecified: Secondary | ICD-10-CM | POA: Diagnosis not present

## 2018-04-03 DIAGNOSIS — F411 Generalized anxiety disorder: Secondary | ICD-10-CM | POA: Diagnosis not present

## 2018-04-03 MED ORDER — CELECOXIB 200 MG PO CAPS: 200.0000 mg | ORAL_CAPSULE | Freq: Two times a day (BID) | ORAL | 3 refills | Status: DC

## 2018-04-03 MED ORDER — METHYLPREDNISOLONE 4 MG PO TBPK: ORAL_TABLET | ORAL | 0 refills | Status: DC

## 2018-04-03 NOTE — Progress Notes (Signed)
[facility] [place], [place] [address]  Internal MEDICINE  Office Visit Note  Patient Name: Kristine Stephens  [id number]  [id number]  Date of Service: 04/12/2018  Chief Complaint  Patient presents with  . Medical Management of Chronic Issues    discuss medication changes. pt has noticed a change in behavior since she has been taking cymbalta,very moody     The patient is concerned about changes in behavior and the way she feels over the past few weeks. She thinks this may be made worse by pain she is in. Does have rheumatoid arthritis. She has pain in right leg and right ankle. Hurts to sit or to lay down on right side. She was finally approved for enbrel and had her first shot of this on Thursday. She has not noted any improvement yet .      Current Medication: Outpatient Encounter Medications as of 04/03/2018  Medication Sig  . Cholecalciferol (VITAMIN D-1000 MAX ST) 1000 units tablet Take by mouth.  . clonazePAM (KLONOPIN) 0.5 MG tablet Take 1 tablet (0.5 mg total) by mouth at bedtime.  . clotrimazole-betamethasone (LOTRISONE) cream Apply 1 application topically 2 (two) times daily.  . DULoxetine (CYMBALTA) 30 MG capsule Take 1 capsule (30 mg total) by mouth daily.  . ergocalciferol (DRISDOL) 50000 units capsule Take 1 capsule (50,000 Units total) by mouth once a week.  . estradiol (ESTRACE VAGINAL) 0.1 MG/GM vaginal cream Place 1 g vaginally 3 (three) times a week.  . naproxen sodium (ANAPROX) 220 MG tablet Take by mouth.  Marland Kitchen omeprazole (PRILOSEC) 20 MG capsule TK ONE C PO  ONE TO TWO TIMES D  . phentermine (ADIPEX-P) 37.5 MG tablet Take 1 tablet (37.5 mg total) by mouth daily before breakfast.  . celecoxib (CELEBREX) 200 MG capsule Take 1 capsule (200 mg total) by mouth 2 (two) times daily.  . methylPREDNISolone (MEDROL) 4 MG TBPK tablet Take by mouth as directed for 6 days   No facility-administered encounter medications on file as of 04/03/2018.      Surgical History: Past Surgical History:  Procedure Laterality Date  . ASD REPAIR  1985  . CARPAL TUNNEL RELEASE    . CESAREAN SECTION    . COLONOSCOPY WITH PROPOFOL N/A 04/11/2017   Procedure: COLONOSCOPY WITH PROPOFOL;  Surgeon: Jonathon Bellows, MD;  Location: [facility] ENDOSCOPY;  Service: Gastroenterology;  Laterality: N/A;  . COLPOSCOPY  2012  . ENDOMETRIAL ABLATION     Failed ablation x2 (Tech difficulties)  . LAPAROSCOPIC VAGINAL HYSTERECTOMY WITH SALPINGO OOPHORECTOMY  2012   Adenomyosis for AUB  . TONSILLECTOMY    . TUBAL LIGATION      Medical History: Past Medical History:  Diagnosis Date  . Anxiety   . Depression   . GERD (gastroesophageal reflux disease)   . Psoriasis     Family History: Family History  Problem Relation Age of Onset  . Breast cancer Paternal Grandmother 67    Social History   Socioeconomic History  . Marital status: Married    Spouse name: Not on file  . Number of children: Not on file  . Years of education: Not on file  . Highest education level: Not on file  Occupational History  . Not on file  Social Needs  . Financial resource strain: Not on file  . Food insecurity:    Worry: Not on file    Inability: Not on file  . Transportation needs:    Medical: Not on file  Non-medical: Not on file  Tobacco Use  . Smoking status: Never Smoker  . Smokeless tobacco: Never Used  Substance and Sexual Activity  . Alcohol use: No  . Drug use: No  . Sexual activity: Yes    Partners: Male    Birth control/protection: None  Lifestyle  . Physical activity:    Days per week: Not on file    Minutes per session: Not on file  . Stress: Not on file  Relationships  . Social connections:    Talks on phone: Not on file    Gets together: Not on file    Attends religious service: Not on file    Active member of club or organization: Not on file    Attends meetings of clubs or organizations: Not on file    Relationship status: Not on file  .  Intimate partner violence:    Fear of current or ex partner: Not on file    Emotionally abused: Not on file    Physically abused: Not on file    Forced sexual activity: Not on file  Other Topics Concern  . Not on file  Social History Narrative  . Not on file      Review of Systems  Constitutional: Positive for fatigue. Negative for activity change, chills and unexpected weight change.  HENT: Negative for congestion, postnasal drip, rhinorrhea, sneezing and sore throat.   Eyes: Negative.  Negative for redness.  Respiratory: Negative for cough, chest tightness and shortness of breath.   Cardiovascular: Negative for chest pain and palpitations.  Gastrointestinal: Negative for abdominal pain, constipation, diarrhea, nausea and vomiting.  Endocrine: Negative for cold intolerance, heat intolerance, polydipsia and polyuria.  Genitourinary: Negative.  Negative for dysuria and frequency.  Musculoskeletal: Positive for arthralgias, joint swelling and myalgias. Negative for back pain and neck pain.       Right leg pain, feeling as though it is welling to the point it is going to explode. Hurts to touch or put pressure on her right leg and she is unable to lie down on the right side.   Skin: Positive for rash.  Allergic/Immunologic: Positive for environmental allergies.  Neurological: Negative for dizziness, tremors, numbness and headaches.  Hematological: Negative for adenopathy. Does not bruise/bleed easily.  Psychiatric/Behavioral: Positive for behavioral problems (Depression). Negative for sleep disturbance and suicidal ideas. The patient is nervous/anxious.     Today's Vitals   04/03/18 1450  BP: 123/81  Pulse: 77  Resp: 16  SpO2: 99%  Weight: 174 lb (78.9 kg)  Height: 5' (1.524 m)    Physical Exam  Constitutional: She is oriented to person, place, and time. She appears well-developed and well-nourished. No distress.  HENT:  Head: Normocephalic and atraumatic.  Nose: Nose  normal.  Mouth/Throat: Oropharynx is clear and moist. No oropharyngeal exudate.  Eyes: Pupils are equal, round, and reactive to light. Conjunctivae and EOM are normal.  Neck: Normal range of motion. Neck supple. No JVD present. No tracheal deviation present. No thyromegaly present.  Cardiovascular: Normal rate, regular rhythm and normal heart sounds. Exam reveals no gallop and no friction rub.  No murmur heard. Pulmonary/Chest: Effort normal and breath sounds normal. No respiratory distress. She has no wheezes. She has no rales. She exhibits no tenderness.  Abdominal: Soft. Bowel sounds are normal. There is no tenderness.  Musculoskeletal: Normal range of motion.  Generalized joint pain with point tenderness present.   Lymphadenopathy:    She has no cervical adenopathy.  Neurological: She is  alert and oriented to person, place, and time. No cranial nerve deficit.  Skin: Skin is warm and dry. Capillary refill takes less than 2 seconds. She is not diaphoretic.  Psychiatric: Her speech is normal and behavior is normal. Judgment and thought content normal. Her mood appears anxious. Cognition and memory are normal. She exhibits a depressed mood.  Nursing note and vitals reviewed.  Assessment/Plan: 1. Inflammatory osteoarthritis Medrol dose pack. Take as directed for 6 days to reduce pain/inflammation. Added celebrex 200mg  which may be taken twice daily to help relieve pain.  - methylPREDNISolone (MEDROL) 4 MG TBPK tablet; Take by mouth as directed for 6 days  Dispense: 21 tablet; Refill: 0 - celecoxib (CELEBREX) 200 MG capsule; Take 1 capsule (200 mg total) by mouth 2 (two) times daily.  Dispense: 60 capsule; Refill: 3  2. Psoriasis Continue regular visits with rheumatology as scheduled.   3. GAD (generalized anxiety disorder) Continue duloxetine 30mg  daily. Take clonazepam as needed and as prescribed.   General Counseling: dajanay northrup understanding of the findings of todays visit and  agrees with plan of treatment. I have discussed any further diagnostic evaluation that may be needed or ordered today. We also reviewed her medications today. she has been encouraged to call the office with any questions or concerns that should arise related to todays visit.  This patient was seen by [place] with Dr Lavera Guise as a part of collaborative care agreement   Meds ordered this encounter  Medications  . methylPREDNISolone (MEDROL) 4 MG TBPK tablet    Sig: Take by mouth as directed for 6 days    Dispense:  21 tablet    Refill:  0    Order Specific Question:   Supervising Provider    Answer:   Lavera Guise [place]  . celecoxib (CELEBREX) 200 MG capsule    Sig: Take 1 capsule (200 mg total) by mouth 2 (two) times daily.    Dispense:  60 capsule    Refill:  3    Order Specific Question:   Supervising Provider    Answer:   Lavera Guise [[id number]]    Time spent: 52 Minutes      Dr Lavera Guise Internal medicine

## 2018-04-12 DIAGNOSIS — L409 Psoriasis, unspecified: Secondary | ICD-10-CM | POA: Insufficient documentation

## 2018-04-14 ENCOUNTER — Telehealth: Payer: Self-pay

## 2018-04-14 NOTE — Telephone Encounter (Signed)
Pt advised stopped  cymbalta  And if her symptoms worse need to seen as per dr Humphrey Rolls also send heather reminder if she want to see pt sooner

## 2018-04-17 NOTE — Telephone Encounter (Signed)
She has appointment 10/23. She can take prozac until then.

## 2018-04-18 NOTE — Telephone Encounter (Signed)
Pt advised  Take prozac and discuss in detail at next visit

## 2018-04-29 ENCOUNTER — Other Ambulatory Visit: Payer: Self-pay | Admitting: Nurse Practitioner

## 2018-04-29 DIAGNOSIS — E559 Vitamin D deficiency, unspecified: Secondary | ICD-10-CM

## 2018-04-29 MED ORDER — ERGOCALCIFEROL 1.25 MG (50000 UT) PO CAPS: 50000.0000 [IU] | ORAL_CAPSULE | ORAL | 5 refills | Status: DC

## 2018-04-30 ENCOUNTER — Telehealth: Payer: Self-pay

## 2018-04-30 NOTE — Telephone Encounter (Signed)
Error

## 2018-05-06 ENCOUNTER — Ambulatory Visit: Payer: Managed Care, Other (non HMO) | Admitting: Nurse Practitioner

## 2018-05-06 ENCOUNTER — Encounter: Payer: Self-pay | Admitting: Nurse Practitioner

## 2018-05-06 VITALS — BP 118/75 | HR 71 | Resp 16 | Ht 60.0 in | Wt 171.0 lb

## 2018-05-06 DIAGNOSIS — F411 Generalized anxiety disorder: Secondary | ICD-10-CM | POA: Diagnosis not present

## 2018-05-06 DIAGNOSIS — E668 Other obesity: Secondary | ICD-10-CM | POA: Diagnosis not present

## 2018-05-06 DIAGNOSIS — M1990 Unspecified osteoarthritis, unspecified site: Secondary | ICD-10-CM | POA: Diagnosis not present

## 2018-05-06 MED ORDER — CLONAZEPAM 0.5 MG PO TABS: 0.5000 mg | ORAL_TABLET | Freq: Every day | ORAL | 3 refills | Status: DC

## 2018-05-06 MED ORDER — FLUOXETINE HCL 20 MG PO CAPS: 20.0000 mg | ORAL_CAPSULE | Freq: Every day | ORAL | 3 refills | Status: DC

## 2018-05-06 NOTE — Progress Notes (Signed)
[facility] [place], [place] [address]  Internal MEDICINE  Office Visit Note  Patient Name: Kristine Stephens  [id number]  [id number]  Date of Service: 05/07/2018  Chief Complaint  Patient presents with  . Medical Management of Chronic Issues    pt went off cymbalta and started back on prozac  . Pain    pt has right ear pain and she believes that it comes from grinding her teeth. hurts to chew    The patient is concerned about changes in behavior and the way she feels over the past few weeks. Since her last visit, she has stopped duloxetine and restarted her prozac. Feeling much better, however,, she has noted an increase in overall pain levels.  Does have rheumatoid arthritis. She has pain in right leg and right ankle. Hurts to sit or to lay down on right side. She was finally approved for enbrel. She has had six shots total and is not noticing a big difference in her pain levels. Was suggested by her chiropractor, that leg pain might be related to pinched nerve. Her rheumatologist has done imaging studies of her lumbar spine for further evaluation.  She has lost 3 pounds since her last visit. Currently not taking appetite suppressants. Feels as though change back to prozac may be causing her to eat and snack a little more often. Now that things are a little settled, she would like to try appetite suppressant again.       Current Medication: Outpatient Encounter Medications as of 05/06/2018  Medication Sig Note  . celecoxib (CELEBREX) 200 MG capsule Take 1 capsule (200 mg total) by mouth 2 (two) times daily.   . Cholecalciferol (VITAMIN D-1000 MAX ST) 1000 units tablet Take by mouth.   . clotrimazole-betamethasone (LOTRISONE) cream Apply 1 application topically 2 (two) times daily.   . ergocalciferol (DRISDOL) 50000 units capsule Take 1 capsule (50,000 Units total) by mouth once a week.   . estradiol (ESTRACE VAGINAL) 0.1 MG/GM vaginal cream Place 1 g vaginally  3 (three) times a week.   . methylPREDNISolone (MEDROL) 4 MG TBPK tablet Take by mouth as directed for 6 days   . omeprazole (PRILOSEC) 20 MG capsule TK ONE C PO  ONE TO TWO TIMES D   . phentermine (ADIPEX-P) 37.5 MG tablet Take 1 tablet (37.5 mg total) by mouth daily before breakfast.   . [DISCONTINUED] DULoxetine (CYMBALTA) 30 MG capsule Take 1 capsule (30 mg total) by mouth daily.   . [DISCONTINUED] naproxen sodium (ANAPROX) 220 MG tablet Take by mouth. 05/06/2018: orthopedics started her on lodine instead  . clonazePAM (KLONOPIN) 0.5 MG tablet Take 1 tablet (0.5 mg total) by mouth at bedtime.   Marland Kitchen FLUoxetine (PROZAC) 20 MG capsule Take 1 capsule (20 mg total) by mouth daily.   . [DISCONTINUED] clonazePAM (KLONOPIN) 0.5 MG tablet Take 1 tablet (0.5 mg total) by mouth at bedtime. (Patient not taking: Reported on 05/06/2018)    No facility-administered encounter medications on file as of 05/06/2018.     Surgical History: Past Surgical History:  Procedure Laterality Date  . ASD REPAIR  1985  . CARPAL TUNNEL RELEASE    . CESAREAN SECTION    . COLONOSCOPY WITH PROPOFOL N/A 04/11/2017   Procedure: COLONOSCOPY WITH PROPOFOL;  Surgeon: Jonathon Bellows, MD;  Location: [facility] ENDOSCOPY;  Service: Gastroenterology;  Laterality: N/A;  . COLPOSCOPY  2012  . ENDOMETRIAL ABLATION     Failed ablation x2 (Tech difficulties)  . LAPAROSCOPIC VAGINAL  HYSTERECTOMY WITH SALPINGO OOPHORECTOMY  2012   Adenomyosis for AUB  . TONSILLECTOMY    . TUBAL LIGATION      Medical History: Past Medical History:  Diagnosis Date  . Anxiety   . Depression   . GERD (gastroesophageal reflux disease)   . Psoriasis     Family History: Family History  Problem Relation Age of Onset  . Breast cancer Paternal Grandmother 77    Social History   Socioeconomic History  . Marital status: Married    Spouse name: Not on file  . Number of children: Not on file  . Years of education: Not on file  . Highest education level:  Not on file  Occupational History  . Not on file  Social Needs  . Financial resource strain: Not on file  . Food insecurity:    Worry: Not on file    Inability: Not on file  . Transportation needs:    Medical: Not on file    Non-medical: Not on file  Tobacco Use  . Smoking status: Never Smoker  . Smokeless tobacco: Never Used  Substance and Sexual Activity  . Alcohol use: No  . Drug use: No  . Sexual activity: Yes    Partners: Male    Birth control/protection: None  Lifestyle  . Physical activity:    Days per week: Not on file    Minutes per session: Not on file  . Stress: Not on file  Relationships  . Social connections:    Talks on phone: Not on file    Gets together: Not on file    Attends religious service: Not on file    Active member of club or organization: Not on file    Attends meetings of clubs or organizations: Not on file    Relationship status: Not on file  . Intimate partner violence:    Fear of current or ex partner: Not on file    Emotionally abused: Not on file    Physically abused: Not on file    Forced sexual activity: Not on file  Other Topics Concern  . Not on file  Social History Narrative  . Not on file      Review of Systems  Constitutional: Positive for fatigue. Negative for activity change, chills and unexpected weight change.  HENT: Negative for congestion, postnasal drip, rhinorrhea, sneezing and sore throat.   Eyes: Negative.  Negative for redness.  Respiratory: Negative for cough, chest tightness, shortness of breath and wheezing.   Cardiovascular: Negative for chest pain and palpitations.  Gastrointestinal: Negative for abdominal pain, constipation, diarrhea, nausea and vomiting.  Endocrine: Negative for cold intolerance, heat intolerance, polydipsia, polyphagia and polyuria.  Genitourinary: Negative.  Negative for dysuria and frequency.  Musculoskeletal: Positive for arthralgias, joint swelling and myalgias. Negative for back pain  and neck pain.       Right leg pain, feeling as though it is welling to the point it is going to explode. Hurts to touch or put pressure on her right leg and she is unable to lie down on the right side.  This has not improved much since being started on Embrel. She has received six injections so far.   Skin: Positive for rash.  Allergic/Immunologic: Positive for environmental allergies.  Neurological: Negative for dizziness, tremors, numbness and headaches.  Hematological: Negative for adenopathy. Does not bruise/bleed easily.  Psychiatric/Behavioral: Positive for behavioral problems (Depression). Negative for sleep disturbance and suicidal ideas. The patient is nervous/anxious.  Symptoms have improved since changing from cymbalta back to prozac. Admits she does have trouble sleeping some nights. Has not been using her clonazepam at all.     Today's Vitals   05/06/18 1533  BP: 118/75  Pulse: 71  Resp: 16  SpO2: 98%  Weight: 171 lb (77.6 kg)  Height: 5' (1.524 m)    Physical Exam  Constitutional: She is oriented to person, place, and time. She appears well-developed and well-nourished. No distress.  HENT:  Head: Normocephalic and atraumatic.  Mouth/Throat: Oropharynx is clear and moist. No oropharyngeal exudate.  Eyes: Pupils are equal, round, and reactive to light. EOM are normal.  Neck: Normal range of motion. Neck supple. No JVD present. No tracheal deviation present. No thyromegaly present.  Cardiovascular: Normal rate, regular rhythm and normal heart sounds. Exam reveals no gallop and no friction rub.  No murmur heard. Pulmonary/Chest: Effort normal and breath sounds normal. No respiratory distress. She has no wheezes. She has no rales. She exhibits no tenderness.  Abdominal: Soft. Bowel sounds are normal. There is no tenderness.  Musculoskeletal: Normal range of motion.  Generalized joint pain with point tenderness present.   Lymphadenopathy:    She has no cervical  adenopathy.  Neurological: She is alert and oriented to person, place, and time. No cranial nerve deficit.  Skin: Skin is warm and dry. Capillary refill takes less than 2 seconds. She is not diaphoretic.  Psychiatric: Her speech is normal and behavior is normal. Judgment and thought content normal. Her mood appears anxious. Cognition and memory are normal. She exhibits a depressed mood.  Nursing note and vitals reviewed.  Assessment/Plan: 1. Inflammatory osteoarthritis Rheumatology has changed celebrex to lodine, which she is taking twice daily when needed. She should continue with scheduled visits.   2. GAD (generalized anxiety disorder) Continue fluoxetine 20mg  daily. May use clonazepam 0.5mg  tablets at night as needed for acute anxiety. New prescriptions for both were sent to pharmacy today.  - FLUoxetine (PROZAC) 20 MG capsule; Take 1 capsule (20 mg total) by mouth daily.  Dispense: 30 capsule; Refill: 3 - clonazePAM (KLONOPIN) 0.5 MG tablet; Take 1 tablet (0.5 mg total) by mouth at bedtime.  Dispense: 30 tablet; Refill: 3  3. Moderate obesity May restart phentermine daily. Limit calorie intake to 1200-1500 calories per day. Gradually incorporate routine exercise into daily routine.   General Counseling: dynastee brummell understanding of the findings of todays visit and agrees with plan of treatment. I have discussed any further diagnostic evaluation that may be needed or ordered today. We also reviewed her medications today. she has been encouraged to call the office with any questions or concerns that should arise related to todays visit.   There is a liability release in patients' chart. There has been a 10 minute discussion about the side effects including but not limited to elevated blood pressure, anxiety, lack of sleep and dry mouth. Pt understands and will like to start/continue on appetite suppressant at this time. There will be one month RX given at the time of visit with proper  follow up. Nova diet plan with restricted calories is given to the pt. Pt understands and agrees with  plan of treatment  This patient was seen by Leretha Pol FNP Collaboration with Dr Lavera Guise as a part of collaborative care agreement  Meds ordered this encounter  Medications  . FLUoxetine (PROZAC) 20 MG capsule    Sig: Take 1 capsule (20 mg total) by mouth daily.  Dispense:  30 capsule    Refill:  3    Order Specific Question:   Supervising Provider    Answer:   Lavera Guise [[id number]]  . clonazePAM (KLONOPIN) 0.5 MG tablet    Sig: Take 1 tablet (0.5 mg total) by mouth at bedtime.    Dispense:  30 tablet    Refill:  3    Order Specific Question:   Supervising Provider    Answer:   Lavera Guise [[id number]]    Time spent: 63 Minutes      Dr Lavera Guise Internal medicine

## 2018-05-12 ENCOUNTER — Ambulatory Visit
Admission: RE | Admit: 2018-05-12 | Discharge: 2018-05-12 | Disposition: A | Discharge status: Home / Self Care | Payer: Managed Care, Other (non HMO) | Source: Ambulatory Visit | Attending: Obstetrics and Gynecology | Admitting: Obstetrics and Gynecology

## 2018-05-12 DIAGNOSIS — Z1239 Encounter for other screening for malignant neoplasm of breast: Secondary | ICD-10-CM | POA: Diagnosis not present

## 2018-05-13 ENCOUNTER — Other Ambulatory Visit: Payer: Self-pay | Admitting: Rheumatology

## 2018-05-13 DIAGNOSIS — G8929 Other chronic pain: Secondary | ICD-10-CM

## 2018-05-13 DIAGNOSIS — M79604 Pain in right leg: Secondary | ICD-10-CM

## 2018-05-13 DIAGNOSIS — M545 Low back pain, unspecified: Secondary | ICD-10-CM

## 2018-05-26 ENCOUNTER — Ambulatory Visit
Admission: RE | Admit: 2018-05-26 | Discharge: 2018-05-26 | Disposition: A | Discharge status: Home / Self Care | Payer: Managed Care, Other (non HMO) | Source: Ambulatory Visit | Attending: Rheumatology | Admitting: Rheumatology

## 2018-05-26 DIAGNOSIS — G8929 Other chronic pain: Secondary | ICD-10-CM

## 2018-05-26 DIAGNOSIS — M545 Low back pain, unspecified: Secondary | ICD-10-CM

## 2018-05-26 DIAGNOSIS — M79604 Pain in right leg: Secondary | ICD-10-CM

## 2018-06-17 ENCOUNTER — Ambulatory Visit: Payer: Self-pay | Admitting: Nurse Practitioner

## 2018-06-24 ENCOUNTER — Ambulatory Visit (INDEPENDENT_AMBULATORY_CARE_PROVIDER_SITE_OTHER): Payer: Managed Care, Other (non HMO) | Admitting: Nurse Practitioner

## 2018-06-24 ENCOUNTER — Encounter: Payer: Self-pay | Admitting: Nurse Practitioner

## 2018-06-24 VITALS — BP 112/76 | HR 77 | Resp 16 | Ht 60.0 in | Wt 172.6 lb

## 2018-06-24 DIAGNOSIS — K219 Gastro-esophageal reflux disease without esophagitis: Secondary | ICD-10-CM

## 2018-06-24 DIAGNOSIS — M1990 Unspecified osteoarthritis, unspecified site: Secondary | ICD-10-CM | POA: Diagnosis not present

## 2018-06-24 DIAGNOSIS — F411 Generalized anxiety disorder: Secondary | ICD-10-CM

## 2018-06-24 DIAGNOSIS — E668 Other obesity: Secondary | ICD-10-CM

## 2018-06-24 MED ORDER — CLONAZEPAM 0.5 MG PO TABS: 0.5000 mg | ORAL_TABLET | Freq: Every day | ORAL | 3 refills | Status: DC

## 2018-06-24 MED ORDER — FLUOXETINE HCL 20 MG PO CAPS: 20.0000 mg | ORAL_CAPSULE | Freq: Every day | ORAL | 3 refills | Status: DC

## 2018-06-24 NOTE — Progress Notes (Signed)
[facility] [place], [place] [address]  Internal MEDICINE  Office Visit Note  Patient Name: Kristine Stephens  [id number]  [id number]  Date of Service: 06/25/2018  Chief Complaint  Patient presents with  . Medical Management of Chronic Issues    6 wk follow up weight management     The patient is here for routine follow up visit. Is doing much better since she has switched over to her prozac.she is smiling more and having far more good days than bad. She is still taking Embrel, however, she is not noting any improvement in her overall pain levels.  She has gained one pound since her most recent visit. She is not really taking phentermine to help her lost weight. She would really like to break from this medication during the holiday season.       Current Medication: Outpatient Encounter Medications as of 06/24/2018  Medication Sig  . celecoxib (CELEBREX) 200 MG capsule Take 1 capsule (200 mg total) by mouth 2 (two) times daily.  . Cholecalciferol (VITAMIN D-1000 MAX ST) 1000 units tablet Take by mouth.  . clonazePAM (KLONOPIN) 0.5 MG tablet Take 1 tablet (0.5 mg total) by mouth at bedtime.  . clotrimazole-betamethasone (LOTRISONE) cream Apply 1 application topically 2 (two) times daily.  . ergocalciferol (DRISDOL) 50000 units capsule Take 1 capsule (50,000 Units total) by mouth once a week.  . estradiol (ESTRACE VAGINAL) 0.1 MG/GM vaginal cream Place 1 g vaginally 3 (three) times a week.  Marland Kitchen FLUoxetine (PROZAC) 20 MG capsule Take 1 capsule (20 mg total) by mouth daily.  . methylPREDNISolone (MEDROL) 4 MG TBPK tablet Take by mouth as directed for 6 days  . omeprazole (PRILOSEC) 20 MG capsule TK ONE C PO  ONE TO TWO TIMES D  . phentermine (ADIPEX-P) 37.5 MG tablet Take 1 tablet (37.5 mg total) by mouth daily before breakfast.  . [DISCONTINUED] clonazePAM (KLONOPIN) 0.5 MG tablet Take 1 tablet (0.5 mg total) by mouth at bedtime.  . [DISCONTINUED] FLUoxetine  (PROZAC) 20 MG capsule Take 1 capsule (20 mg total) by mouth daily.   No facility-administered encounter medications on file as of 06/24/2018.     Surgical History: Past Surgical History:  Procedure Laterality Date  . ASD REPAIR  1985  . CARPAL TUNNEL RELEASE    . CESAREAN SECTION    . COLONOSCOPY WITH PROPOFOL N/A 04/11/2017   Procedure: COLONOSCOPY WITH PROPOFOL;  Surgeon: Jonathon Bellows, MD;  Location: [facility] ENDOSCOPY;  Service: Gastroenterology;  Laterality: N/A;  . COLPOSCOPY  2012  . ENDOMETRIAL ABLATION     Failed ablation x2 (Tech difficulties)  . LAPAROSCOPIC VAGINAL HYSTERECTOMY WITH SALPINGO OOPHORECTOMY  2012   Adenomyosis for AUB  . TONSILLECTOMY    . TUBAL LIGATION      Medical History: Past Medical History:  Diagnosis Date  . Anxiety   . Depression   . GERD (gastroesophageal reflux disease)   . Psoriasis     Family History: Family History  Problem Relation Age of Onset  . Breast cancer Paternal Grandmother 72    Social History   Socioeconomic History  . Marital status: Married    Spouse name: Not on file  . Number of children: Not on file  . Years of education: Not on file  . Highest education level: Not on file  Occupational History  . Not on file  Social Needs  . Financial resource strain: Not on file  . Food insecurity:    Worry: Not  on file    Inability: Not on file  . Transportation needs:    Medical: Not on file    Non-medical: Not on file  Tobacco Use  . Smoking status: Never Smoker  . Smokeless tobacco: Never Used  Substance and Sexual Activity  . Alcohol use: No  . Drug use: No  . Sexual activity: Yes    Partners: Male    Birth control/protection: None  Lifestyle  . Physical activity:    Days per week: Not on file    Minutes per session: Not on file  . Stress: Not on file  Relationships  . Social connections:    Talks on phone: Not on file    Gets together: Not on file    Attends religious service: Not on file    Active  member of club or organization: Not on file    Attends meetings of clubs or organizations: Not on file    Relationship status: Not on file  . Intimate partner violence:    Fear of current or ex partner: Not on file    Emotionally abused: Not on file    Physically abused: Not on file    Forced sexual activity: Not on file  Other Topics Concern  . Not on file  Social History Narrative  . Not on file      Review of Systems  Constitutional: Negative for activity change, chills, fatigue and unexpected weight change.  HENT: Negative for congestion, postnasal drip, rhinorrhea, sneezing and sore throat.   Eyes: Negative.   Respiratory: Negative for cough, chest tightness, shortness of breath and wheezing.   Cardiovascular: Negative for chest pain and palpitations.  Gastrointestinal: Negative for abdominal pain, constipation, diarrhea, nausea and vomiting.  Endocrine: Negative for cold intolerance, heat intolerance, polydipsia, polyphagia and polyuria.  Musculoskeletal: Positive for arthralgias, joint swelling and myalgias. Negative for back pain and neck pain.       Right leg pain, feeling as though it is welling to the point it is going to explode. Hurts to touch or put pressure on her right leg and she is unable to lie down on the right side.  This has not improved much since being started on Embrel. She has received six injections so far.   Skin: Negative for rash.  Allergic/Immunologic: Positive for environmental allergies.  Neurological: Negative for dizziness, tremors, numbness and headaches.  Hematological: Negative for adenopathy. Does not bruise/bleed easily.  Psychiatric/Behavioral: Positive for behavioral problems (Depression). Negative for sleep disturbance and suicidal ideas. The patient is nervous/anxious.        Symptoms have improved since changing from cymbalta back to prozac. Admits she does have trouble sleeping some nights.uses her clonazepam at night, only when needed .      Today's Vitals   06/24/18 1554  BP: 112/76  Pulse: 77  Resp: 16  SpO2: 98%  Weight: 172 lb 9.6 oz (78.3 kg)  Height: 5' (1.524 m)    Physical Exam Vitals signs and nursing note reviewed.  Constitutional:      General: She is not in acute distress.    Appearance: Normal appearance. She is well-developed. She is not diaphoretic.  HENT:     Head: Normocephalic and atraumatic.     Nose: Nose normal.     Mouth/Throat:     Pharynx: No oropharyngeal exudate.  Eyes:     Pupils: Pupils are equal, round, and reactive to light.  Neck:     Musculoskeletal: Normal range of motion and neck  supple.     Thyroid: No thyromegaly.     Vascular: No JVD.     Trachea: No tracheal deviation.  Cardiovascular:     Rate and Rhythm: Normal rate and regular rhythm.     Heart sounds: Normal heart sounds. No murmur. No friction rub. No gallop.   Pulmonary:     Effort: Pulmonary effort is normal. No respiratory distress.     Breath sounds: Normal breath sounds. No wheezing or rales.  Chest:     Chest wall: No tenderness.  Abdominal:     General: Bowel sounds are normal.     Palpations: Abdomen is soft.     Tenderness: There is no abdominal tenderness.  Musculoskeletal: Normal range of motion.     Comments: Generalized joint pain with point tenderness present.   Lymphadenopathy:     Cervical: No cervical adenopathy.  Skin:    General: Skin is warm and dry.     Capillary Refill: Capillary refill takes less than 2 seconds.  Neurological:     General: No focal deficit present.     Mental Status: She is alert and oriented to person, place, and time.     Cranial Nerves: No cranial nerve deficit.  Psychiatric:        Attention and Perception: Attention and perception normal.        Mood and Affect: Mood is anxious and depressed.        Speech: Speech normal.        Behavior: Behavior normal. Behavior is cooperative.        Thought Content: Thought content normal.        Cognition and Memory:  Cognition and memory normal.        Judgment: Judgment normal.     Comments: Mood has improved significantly since her most recent visit.     Assessment/Plan: 1. Inflammatory osteoarthritis Continue regular visits with rheumatology and treatment with Embrel as prescribed.   2. Gastroesophageal reflux disease without esophagitis Continue omeprazole as prescribed. May take 2 capsules when needed for more severe episodes of GERD.   3. GAD (generalized anxiety disorder) Doing very well. Continue prozac 20mg  daily. May take clonazepam 0.5mg  at bedtime as needed for acute anxiety. New prescriptions were sent to pharmacy for both medications.  - clonazePAM (KLONOPIN) 0.5 MG tablet; Take 1 tablet (0.5 mg total) by mouth at bedtime.  Dispense: 30 tablet; Refill: 3 - FLUoxetine (PROZAC) 20 MG capsule; Take 1 capsule (20 mg total) by mouth daily.  Dispense: 30 capsule; Refill: 3  4. Moderate obesity Will break from appetite suppressant at this time. Continue to consume low-calorie diet and add exercise into daily routine.   General Counseling: amylia collazos understanding of the findings of todays visit and agrees with plan of treatment. I have discussed any further diagnostic evaluation that may be needed or ordered today. We also reviewed her medications today. she has been encouraged to call the office with any questions or concerns that should arise related to todays visit.   There is a liability release in patients' chart. There has been a 10 minute discussion about the side effects including but not limited to elevated blood pressure, anxiety, lack of sleep and dry mouth. Pt understands and will like to start/continue on appetite suppressant at this time. There will be one month RX given at the time of visit with proper follow up. Nova diet plan with restricted calories is given to the pt. Pt understands and agrees with  plan of treatment  This patient was seen by Leretha Pol FNP  Collaboration with Dr Lavera Guise as a part of collaborative care agreement  Meds ordered this encounter  Medications  . clonazePAM (KLONOPIN) 0.5 MG tablet    Sig: Take 1 tablet (0.5 mg total) by mouth at bedtime.    Dispense:  30 tablet    Refill:  3    Order Specific Question:   Supervising Provider    Answer:   Lavera Guise [[id number]]  . FLUoxetine (PROZAC) 20 MG capsule    Sig: Take 1 capsule (20 mg total) by mouth daily.    Dispense:  30 capsule    Refill:  3    Order Specific Question:   Supervising Provider    Answer:   Lavera Guise [[id number]]    Time spent: 41 Minutes      Dr Lavera Guise Internal medicine

## 2018-07-04 ENCOUNTER — Ambulatory Visit: Payer: Self-pay | Admitting: Adult Health

## 2018-08-19 ENCOUNTER — Telehealth: Payer: Self-pay

## 2018-08-19 NOTE — Telephone Encounter (Signed)
Pt calling triage wanting [facility] to send in a rx of Premarin, vaginal cream. I see estrace in her chart. Please advise.

## 2018-08-19 NOTE — Telephone Encounter (Signed)
I spoke to patient, she states originally [facility]MS had called in the Premarin, it was at that time too expensive. So he called in the Estrace cream. She now has an insurance that will pay for the Premarin cream and would like that called in. [address]  Pt aware [facility]MS is out of the office today and will return tomorrow.

## 2018-08-20 ENCOUNTER — Other Ambulatory Visit: Payer: Self-pay | Admitting: Obstetrics and Gynecology

## 2018-08-20 MED ORDER — ESTROGENS, CONJUGATED 0.625 MG/GM VA CREA: 1.0000 g | TOPICAL_CREAM | VAGINAL | 3 refills | Status: DC

## 2018-08-20 NOTE — Telephone Encounter (Signed)
Rx has been sent  

## 2018-08-21 NOTE — Telephone Encounter (Signed)
Pt aware via voicemail 

## 2018-09-16 ENCOUNTER — Other Ambulatory Visit: Payer: Self-pay

## 2018-09-16 DIAGNOSIS — F411 Generalized anxiety disorder: Secondary | ICD-10-CM

## 2018-09-16 MED ORDER — FLUOXETINE HCL 20 MG PO CAPS: 20.0000 mg | ORAL_CAPSULE | Freq: Every day | ORAL | 3 refills | Status: DC

## 2018-10-28 ENCOUNTER — Ambulatory Visit: Payer: 59 | Admitting: Nurse Practitioner

## 2018-10-28 ENCOUNTER — Encounter: Payer: Self-pay | Admitting: Nurse Practitioner

## 2018-10-28 VITALS — Ht 60.0 in | Wt 171.0 lb

## 2018-10-28 DIAGNOSIS — E559 Vitamin D deficiency, unspecified: Secondary | ICD-10-CM

## 2018-10-28 DIAGNOSIS — E668 Other obesity: Secondary | ICD-10-CM | POA: Diagnosis not present

## 2018-10-28 DIAGNOSIS — M1990 Unspecified osteoarthritis, unspecified site: Secondary | ICD-10-CM | POA: Diagnosis not present

## 2018-10-28 DIAGNOSIS — F411 Generalized anxiety disorder: Secondary | ICD-10-CM | POA: Diagnosis not present

## 2018-10-28 DIAGNOSIS — R69 Illness, unspecified: Secondary | ICD-10-CM | POA: Diagnosis not present

## 2018-10-28 MED ORDER — PHENTERMINE HCL 37.5 MG PO TABS: 37.5000 mg | ORAL_TABLET | Freq: Every day | ORAL | 1 refills | Status: DC

## 2018-10-28 MED ORDER — ERGOCALCIFEROL 1.25 MG (50000 UT) PO CAPS: 50000.0000 [IU] | ORAL_CAPSULE | ORAL | 5 refills | Status: DC

## 2018-10-28 MED ORDER — CLONAZEPAM 0.5 MG PO TABS: 0.5000 mg | ORAL_TABLET | Freq: Two times a day (BID) | ORAL | 3 refills | Status: DC | PRN

## 2018-10-28 NOTE — Progress Notes (Signed)
[facility] [place], [place] [address]  Internal MEDICINE  Telephone Visit  Patient Name: Kristine Stephens  [id number]  [id number]  Date of Service: 11/09/2018  I connected with the patient at 4:06pm by webcam and verified the patients identity using two identifiers.   I discussed the limitations, risks, security and privacy concerns of performing an evaluation and management service by webcam and the availability of in person appointments. I also discussed with the patient that there may be a patient responsible charge related to the service.  The patient expressed understanding and agrees to proceed.    Chief Complaint  Patient presents with  . Telephone Assessment  . Telephone Screen  . Depression  . Anxiety    The patient has been contacted via webcam for follow up visit due to concerns for spread of novel coronavirus.  The patient states that she is doing well controlling her anxiety and depression.  Taking clonazepam mostly at night. Is concerned about weight gain since stay at home orders went into place about 4 weeks ago. She has actually lost one pounds since her last visit. She is closely monitoring her diet, however, she is finding it difficult to get enough regular exercise. Would liek to go back on appetite suppressant to help control her weight.       Current Medication: Outpatient Encounter Medications as of 10/28/2018  Medication Sig  . celecoxib (CELEBREX) 200 MG capsule Take 1 capsule (200 mg total) by mouth 2 (two) times daily.  . clonazePAM (KLONOPIN) 0.5 MG tablet Take 1 tablet (0.5 mg total) by mouth 2 (two) times daily as needed for anxiety.  . clotrimazole-betamethasone (LOTRISONE) cream Apply 1 application topically 2 (two) times daily.  Marland Kitchen conjugated estrogens (PREMARIN) vaginal cream Place 0.5 Applicatorfuls vaginally 2 (two) times a week.  . ergocalciferol (DRISDOL) 1.25 MG (50000 UT) capsule Take 1 capsule (50,000 Units total) by  mouth once a week.  Marland Kitchen FLUoxetine (PROZAC) 20 MG capsule Take 1 capsule (20 mg total) by mouth daily.  Marland Kitchen omeprazole (PRILOSEC) 20 MG capsule Take 20 mg by mouth daily.   . [DISCONTINUED] clonazePAM (KLONOPIN) 0.5 MG tablet Take 1 tablet (0.5 mg total) by mouth at bedtime.  . [DISCONTINUED] ergocalciferol (DRISDOL) 50000 units capsule Take 1 capsule (50,000 Units total) by mouth once a week.  . Cholecalciferol (VITAMIN D-1000 MAX ST) 1000 units tablet Take by mouth.  . etodolac (LODINE) 400 MG tablet TK 1 T PO BID  . phentermine (ADIPEX-P) 37.5 MG tablet Take 1 tablet (37.5 mg total) by mouth daily before breakfast.  . [DISCONTINUED] methylPREDNISolone (MEDROL) 4 MG TBPK tablet Take by mouth as directed for 6 days (Patient not taking: Reported on 10/28/2018)  . [DISCONTINUED] phentermine (ADIPEX-P) 37.5 MG tablet Take 1 tablet (37.5 mg total) by mouth daily before breakfast. (Patient not taking: Reported on 10/28/2018)   No facility-administered encounter medications on file as of 10/28/2018.     Surgical History: Past Surgical History:  Procedure Laterality Date  . ASD REPAIR  1985  . CARPAL TUNNEL RELEASE    . CESAREAN SECTION    . COLONOSCOPY WITH PROPOFOL N/A 04/11/2017   Procedure: COLONOSCOPY WITH PROPOFOL;  Surgeon: Jonathon Bellows, MD;  Location: [facility] ENDOSCOPY;  Service: Gastroenterology;  Laterality: N/A;  . COLPOSCOPY  2012  . ENDOMETRIAL ABLATION     Failed ablation x2 (Tech difficulties)  . LAPAROSCOPIC VAGINAL HYSTERECTOMY WITH SALPINGO OOPHORECTOMY  2012   Adenomyosis for AUB  . TONSILLECTOMY    .  TUBAL LIGATION      Medical History: Past Medical History:  Diagnosis Date  . Anxiety   . Depression   . GERD (gastroesophageal reflux disease)   . Psoriasis     Family History: Family History  Problem Relation Age of Onset  . Breast cancer Paternal Grandmother 58    Social History   Socioeconomic History  . Marital status: Married    Spouse name: Not on file  .  Number of children: Not on file  . Years of education: Not on file  . Highest education level: Not on file  Occupational History  . Not on file  Social Needs  . Financial resource strain: Not on file  . Food insecurity:    Worry: Not on file    Inability: Not on file  . Transportation needs:    Medical: Not on file    Non-medical: Not on file  Tobacco Use  . Smoking status: Never Smoker  . Smokeless tobacco: Never Used  Substance and Sexual Activity  . Alcohol use: No  . Drug use: No  . Sexual activity: Yes    Partners: Male    Birth control/protection: None  Lifestyle  . Physical activity:    Days per week: Not on file    Minutes per session: Not on file  . Stress: Not on file  Relationships  . Social connections:    Talks on phone: Not on file    Gets together: Not on file    Attends religious service: Not on file    Active member of club or organization: Not on file    Attends meetings of clubs or organizations: Not on file    Relationship status: Not on file  . Intimate partner violence:    Fear of current or ex partner: Not on file    Emotionally abused: Not on file    Physically abused: Not on file    Forced sexual activity: Not on file  Other Topics Concern  . Not on file  Social History Narrative  . Not on file      Review of Systems  Constitutional: Positive for fatigue. Negative for activity change, chills and unexpected weight change.  HENT: Negative for congestion, postnasal drip, rhinorrhea, sneezing and sore throat.   Respiratory: Negative for cough, chest tightness, shortness of breath and wheezing.   Cardiovascular: Negative for chest pain and palpitations.  Gastrointestinal: Negative for abdominal pain, constipation, diarrhea, nausea and vomiting.  Endocrine: Negative for cold intolerance, heat intolerance, polydipsia and polyuria.  Musculoskeletal: Positive for arthralgias, joint swelling and myalgias. Negative for back pain and neck pain.        Right leg pain, feeling as though it is welling to the point it is going to explode. Hurts to touch or put pressure on her right leg and she is unable to lie down on the right side.  Still has not improved much since starting on Enbrel, however, it has not become worse.   Skin: Negative for rash.  Allergic/Immunologic: Positive for environmental allergies.  Neurological: Negative for dizziness, tremors, numbness and headaches.  Hematological: Negative for adenopathy. Does not bruise/bleed easily.  Psychiatric/Behavioral: Positive for behavioral problems (Depression). Negative for sleep disturbance and suicidal ideas. The patient is nervous/anxious.        Symptoms have improved since changing from cymbalta back to prozac. Admits she does have trouble sleeping some nights.uses her clonazepam at night, only when needed .     Today's Vitals  10/28/18 1414  Weight: 171 lb (77.6 kg)  Height: 5' (1.524 m)   Body mass index is 33.4 kg/m.  Observation/Objective:  The patient is alert and oriented. She is pleasant and answers all questions appropriately. Breathing is non-labored and she is in no acute distress at this time.    Assessment/Plan: 1. Inflammatory osteoarthritis Continue regularly seeing rheumatology as scheduled.   2. Moderate obesity Restart phentermine daily. Limit calorie intake to 1200/1500 calories per day and gradually incorporate exercise into daily routine.  - phentermine (ADIPEX-P) 37.5 MG tablet; Take 1 tablet (37.5 mg total) by mouth daily before breakfast.  Dispense: 30 tablet; Refill: 1  3. Vitamin D deficiency - ergocalciferol (DRISDOL) 1.25 MG (50000 UT) capsule; Take 1 capsule (50,000 Units total) by mouth once a week.  Dispense: 4 capsule; Refill: 5  4. GAD (generalized anxiety disorder) Clonazepam 0.5mg  may be taken one to two times daily as needed for acute anxiety. New prescription was sent to her pharmacy.  - clonazePAM (KLONOPIN) 0.5 MG tablet; Take 1  tablet (0.5 mg total) by mouth 2 (two) times daily as needed for anxiety.  Dispense: 45 tablet; Refill: 3  General Counseling: lillieanna tuohy understanding of the findings of today's phone visit and agrees with plan of treatment. I have discussed any further diagnostic evaluation that may be needed or ordered today. We also reviewed her medications today. she has been encouraged to call the office with any questions or concerns that should arise related to todays visit.   There is a liability release in patients' chart. There has been a 10 minute discussion about the side effects including but not limited to elevated blood pressure, anxiety, lack of sleep and dry mouth. Pt understands and will like to start/continue on appetite suppressant at this time. There will be one month RX given at the time of visit with proper follow up. Nova diet plan with restricted calories is given to the pt. Pt understands and agrees with  plan of treatment  This patient was seen by Leretha Pol FNP Collaboration with Dr Lavera Guise as a part of collaborative care agreement  Meds ordered this encounter  Medications  . phentermine (ADIPEX-P) 37.5 MG tablet    Sig: Take 1 tablet (37.5 mg total) by mouth daily before breakfast.    Dispense:  30 tablet    Refill:  1    Order Specific Question:   Supervising Provider    Answer:   Lavera Guise Jensen  . ergocalciferol (DRISDOL) 1.25 MG (50000 UT) capsule    Sig: Take 1 capsule (50,000 Units total) by mouth once a week.    Dispense:  4 capsule    Refill:  5    Order Specific Question:   Supervising Provider    Answer:   Lavera Guise [[id number]]  . clonazePAM (KLONOPIN) 0.5 MG tablet    Sig: Take 1 tablet (0.5 mg total) by mouth 2 (two) times daily as needed for anxiety.    Dispense:  45 tablet    Refill:  3    Order Specific Question:   Supervising Provider    Answer:   Lavera Guise [[id number]]    Time spent: 69 Minutes    Dr Lavera Guise Internal medicine

## 2018-11-09 DIAGNOSIS — E559 Vitamin D deficiency, unspecified: Secondary | ICD-10-CM | POA: Insufficient documentation

## 2018-12-11 ENCOUNTER — Ambulatory Visit: Payer: 59 | Admitting: Nurse Practitioner

## 2019-01-01 DIAGNOSIS — L821 Other seborrheic keratosis: Secondary | ICD-10-CM | POA: Diagnosis not present

## 2019-01-01 DIAGNOSIS — R238 Other skin changes: Secondary | ICD-10-CM | POA: Diagnosis not present

## 2019-01-01 DIAGNOSIS — L718 Other rosacea: Secondary | ICD-10-CM | POA: Diagnosis not present

## 2019-01-01 DIAGNOSIS — L811 Chloasma: Secondary | ICD-10-CM | POA: Diagnosis not present

## 2019-01-01 DIAGNOSIS — B078 Other viral warts: Secondary | ICD-10-CM | POA: Diagnosis not present

## 2019-01-01 DIAGNOSIS — L538 Other specified erythematous conditions: Secondary | ICD-10-CM | POA: Diagnosis not present

## 2019-01-01 DIAGNOSIS — L400 Psoriasis vulgaris: Secondary | ICD-10-CM | POA: Diagnosis not present

## 2019-01-20 DIAGNOSIS — G8929 Other chronic pain: Secondary | ICD-10-CM | POA: Diagnosis not present

## 2019-01-20 DIAGNOSIS — M545 Low back pain: Secondary | ICD-10-CM | POA: Diagnosis not present

## 2019-01-20 DIAGNOSIS — L4050 Arthropathic psoriasis, unspecified: Secondary | ICD-10-CM | POA: Diagnosis not present

## 2019-01-20 DIAGNOSIS — M7661 Achilles tendinitis, right leg: Secondary | ICD-10-CM | POA: Diagnosis not present

## 2019-02-16 ENCOUNTER — Ambulatory Visit (INDEPENDENT_AMBULATORY_CARE_PROVIDER_SITE_OTHER): Payer: 59 | Admitting: Nurse Practitioner

## 2019-02-16 ENCOUNTER — Encounter: Payer: Self-pay | Admitting: Nurse Practitioner

## 2019-02-16 ENCOUNTER — Other Ambulatory Visit: Payer: Self-pay

## 2019-02-16 DIAGNOSIS — R69 Illness, unspecified: Secondary | ICD-10-CM | POA: Diagnosis not present

## 2019-02-16 DIAGNOSIS — M1990 Unspecified osteoarthritis, unspecified site: Secondary | ICD-10-CM | POA: Diagnosis not present

## 2019-02-16 DIAGNOSIS — F411 Generalized anxiety disorder: Secondary | ICD-10-CM

## 2019-02-16 DIAGNOSIS — E668 Other obesity: Secondary | ICD-10-CM

## 2019-02-16 DIAGNOSIS — E669 Obesity, unspecified: Secondary | ICD-10-CM

## 2019-02-16 MED ORDER — FLUOXETINE HCL 20 MG PO CAPS: 20.0000 mg | ORAL_CAPSULE | Freq: Every day | ORAL | 3 refills | Status: DC

## 2019-02-16 NOTE — Progress Notes (Signed)
[facility] [place], [place] [address]  Internal MEDICINE  Telephone Visit  Patient Name: Kristine Stephens  [id number]  [id number]  Date of Service: 02/16/2019  I connected with the patient at 2:46pm by webcam and verified the patients identity using two identifiers.   I discussed the limitations, risks, security and privacy concerns of performing an evaluation and management service by webcam and the availability of in person appointments. I also discussed with the patient that there may be a patient responsible charge related to the service.  The patient expressed understanding and agrees to proceed.    Chief Complaint  Patient presents with  . Follow-up  . Anxiety  . Depression  . Gastroesophageal Reflux  . Osteoarthritis  . Psoriasis  . Telephone Assessment  . Telephone Screen    The patient has been contacted via webcam for follow up visit due to concerns for spread of novel coronavirus. The patient states that she is doing well. She states depression and anxiety. Are well managed on current dose of prozac. She rarely uses her clonazepam and does not need refills for this. She does take phentermine, off and on, to help her with weight management. She states that she is currently taking this rarely. Not really doing much with exercise and diet control. This has been issue for her since spread of COVID 19. She has maintained her weight, even lost one pound since her last, in office visit. She has no concerns or complaints today.       Current Medication: Outpatient Encounter Medications as of 02/16/2019  Medication Sig  . celecoxib (CELEBREX) 200 MG capsule Take 1 capsule (200 mg total) by mouth 2 (two) times daily.  . Cholecalciferol (VITAMIN D-1000 MAX ST) 1000 units tablet Take by mouth.  . clonazePAM (KLONOPIN) 0.5 MG tablet Take 1 tablet (0.5 mg total) by mouth 2 (two) times daily as needed for anxiety.  . clotrimazole-betamethasone (LOTRISONE) cream  Apply 1 application topically 2 (two) times daily.  Marland Kitchen conjugated estrogens (PREMARIN) vaginal cream Place 0.5 Applicatorfuls vaginally 2 (two) times a week.  . ergocalciferol (DRISDOL) 1.25 MG (50000 UT) capsule Take 1 capsule (50,000 Units total) by mouth once a week.  . etodolac (LODINE) 400 MG tablet TK 1 T PO BID  . FLUoxetine (PROZAC) 20 MG capsule Take 1 capsule (20 mg total) by mouth daily.  Marland Kitchen omeprazole (PRILOSEC) 20 MG capsule Take 20 mg by mouth daily.   . phentermine (ADIPEX-P) 37.5 MG tablet Take 1 tablet (37.5 mg total) by mouth daily before breakfast.  . [DISCONTINUED] FLUoxetine (PROZAC) 20 MG capsule Take 1 capsule (20 mg total) by mouth daily.   No facility-administered encounter medications on file as of 02/16/2019.     Surgical History: Past Surgical History:  Procedure Laterality Date  . ASD REPAIR  1985  . CARPAL TUNNEL RELEASE    . CESAREAN SECTION    . COLONOSCOPY WITH PROPOFOL N/A 04/11/2017   Procedure: COLONOSCOPY WITH PROPOFOL;  Surgeon: Jonathon Bellows, MD;  Location: [facility] ENDOSCOPY;  Service: Gastroenterology;  Laterality: N/A;  . COLPOSCOPY  2012  . ENDOMETRIAL ABLATION     Failed ablation x2 (Tech difficulties)  . LAPAROSCOPIC VAGINAL HYSTERECTOMY WITH SALPINGO OOPHORECTOMY  2012   Adenomyosis for AUB  . TONSILLECTOMY    . TUBAL LIGATION      Medical History: Past Medical History:  Diagnosis Date  . Anxiety   . Depression   . GERD (gastroesophageal reflux disease)   .  Psoriasis     Family History: Family History  Problem Relation Age of Onset  . Breast cancer Paternal Grandmother 22    Social History   Socioeconomic History  . Marital status: Married    Spouse name: Not on file  . Number of children: Not on file  . Years of education: Not on file  . Highest education level: Not on file  Occupational History  . Not on file  Social Needs  . Financial resource strain: Not on file  . Food insecurity    Worry: Not on file    Inability: Not  on file  . Transportation needs    Medical: Not on file    Non-medical: Not on file  Tobacco Use  . Smoking status: Never Smoker  . Smokeless tobacco: Never Used  Substance and Sexual Activity  . Alcohol use: No  . Drug use: No  . Sexual activity: Yes    Partners: Male    Birth control/protection: None  Lifestyle  . Physical activity    Days per week: Not on file    Minutes per session: Not on file  . Stress: Not on file  Relationships  . Social Herbalist on phone: Not on file    Gets together: Not on file    Attends religious service: Not on file    Active member of club or organization: Not on file    Attends meetings of clubs or organizations: Not on file    Relationship status: Not on file  . Intimate partner violence    Fear of current or ex partner: Not on file    Emotionally abused: Not on file    Physically abused: Not on file    Forced sexual activity: Not on file  Other Topics Concern  . Not on file  Social History Narrative  . Not on file      Review of Systems  Constitutional: Negative for activity change, chills, fatigue and unexpected weight change.  HENT: Negative for congestion, postnasal drip, rhinorrhea, sneezing and sore throat.   Respiratory: Negative for cough, chest tightness, shortness of breath and wheezing.   Cardiovascular: Negative for chest pain and palpitations.  Gastrointestinal: Negative for abdominal pain, constipation, diarrhea, nausea and vomiting.  Endocrine: Negative for cold intolerance, heat intolerance, polydipsia and polyuria.  Musculoskeletal: Positive for arthralgias, joint swelling and myalgias. Negative for back pain and neck pain.       Right leg pain, feeling as though it is welling to the point it is going to explode. Hurts to touch or put pressure on her right leg and she is unable to lie down on the right side. Has started to improve some since starting Enbrel. Pain is more tolerable. Takes etodolac as needed  for acute pain.   Skin: Negative for rash.  Allergic/Immunologic: Positive for environmental allergies.  Neurological: Negative for dizziness, tremors, numbness and headaches.  Hematological: Negative for adenopathy. Does not bruise/bleed easily.  Psychiatric/Behavioral: Positive for behavioral problems (Depression). Negative for sleep disturbance and suicidal ideas. The patient is nervous/anxious.        Symptoms have improved since changing from cymbalta back to prozac. Admits she does have trouble sleeping some nights.uses her clonazepam at night, only when needed .     Vital Signs: There were no vitals taken for this visit.   Observation/Objective:   The patient is alert and oriented. She is pleasant and answers all questions appropriately. Breathing is non-labored. She is in  no acute distress at this time.    Assessment/Plan: 1. Inflammatory osteoarthritis Continue regular visits with rheumatology as scheduled. Etodolac as needed and as prescribed   2. GAD (generalized anxiety disorder) Continue fluoxetine 20mg  daily. May take clonazepam as needed and as prescribed  - FLUoxetine (PROZAC) 20 MG capsule; Take 1 capsule (20 mg total) by mouth daily.  Dispense: 30 capsule; Refill: 3  3. Moderate obesity May take phentermine as needed. Recommended 1200 calorie diet and incorpration of regular exercise into daily routine.   General Counseling: carmella kees understanding of the findings of today's phone visit and agrees with plan of treatment. I have discussed any further diagnostic evaluation that may be needed or ordered today. We also reviewed her medications today. she has been encouraged to call the office with any questions or concerns that should arise related to todays visit.   There is a liability release in patients' chart. There has been a 10 minute discussion about the side effects including but not limited to elevated blood pressure, anxiety, lack of sleep and dry  mouth. Pt understands and will like to start/continue on appetite suppressant at this time. There will be one month RX given at the time of visit with proper follow up. Nova diet plan with restricted calories is given to the pt. Pt understands and agrees with  plan of treatment  This patient was seen by Leretha Pol FNP Collaboration with Dr Lavera Guise as a part of collaborative care agreement  Meds ordered this encounter  Medications  . FLUoxetine (PROZAC) 20 MG capsule    Sig: Take 1 capsule (20 mg total) by mouth daily.    Dispense:  30 capsule    Refill:  3    Order Specific Question:   Supervising Provider    Answer:   Lavera Guise [[id number]]    Time spent: 64 Minutes    Dr Lavera Guise Internal medicine

## 2019-04-02 ENCOUNTER — Other Ambulatory Visit: Payer: Self-pay

## 2019-04-02 ENCOUNTER — Other Ambulatory Visit: Payer: Self-pay | Admitting: Obstetrics and Gynecology

## 2019-04-02 ENCOUNTER — Encounter: Payer: Self-pay | Admitting: Obstetrics and Gynecology

## 2019-04-02 ENCOUNTER — Ambulatory Visit (INDEPENDENT_AMBULATORY_CARE_PROVIDER_SITE_OTHER): Payer: 59 | Admitting: Obstetrics and Gynecology

## 2019-04-02 ENCOUNTER — Other Ambulatory Visit (HOSPITAL_COMMUNITY)
Admission: RE | Admit: 2019-04-02 | Discharge: 2019-04-02 | Disposition: A | Discharge status: Home / Self Care | Payer: 59 | Source: Ambulatory Visit | Attending: Obstetrics and Gynecology | Admitting: Obstetrics and Gynecology

## 2019-04-02 VITALS — BP 110/82 | HR 78 | Ht 60.0 in | Wt 172.0 lb

## 2019-04-02 DIAGNOSIS — Z23 Encounter for immunization: Secondary | ICD-10-CM | POA: Diagnosis not present

## 2019-04-02 DIAGNOSIS — Z124 Encounter for screening for malignant neoplasm of cervix: Secondary | ICD-10-CM | POA: Insufficient documentation

## 2019-04-02 DIAGNOSIS — Z01419 Encounter for gynecological examination (general) (routine) without abnormal findings: Secondary | ICD-10-CM | POA: Diagnosis not present

## 2019-04-02 DIAGNOSIS — Z8742 Personal history of other diseases of the female genital tract: Secondary | ICD-10-CM | POA: Insufficient documentation

## 2019-04-02 DIAGNOSIS — Z1239 Encounter for other screening for malignant neoplasm of breast: Secondary | ICD-10-CM

## 2019-04-02 MED ORDER — PREMARIN 0.625 MG/GM VA CREA: 1.0000 g | TOPICAL_CREAM | VAGINAL | 3 refills | Status: DC

## 2019-04-02 NOTE — Patient Instructions (Signed)
[facility] [address] [place] [address]  [facility] [place]  [address] [place] [address]  Phone: [phone]  

## 2019-04-02 NOTE — Progress Notes (Signed)
Gynecology Annual Exam  PCP: Ronnell Freshwater, NP  Chief Complaint:  Chief Complaint  Patient presents with  . Gynecologic Exam    History of Present Illness:Patient is a 53 y.o. G3P3003 presents for annual exam. The patient has no complaints today.   LMP: No LMP recorded. Patient has had a hysterectomy. Patient status post prior supracervical hysterectomy AUB with pathology showing adenomyosis in 2012 following failed endometrial ablation. Long standing history of abnormal paps.  Still works at [employer].  The patient is sexually active. She denies dyspareunia.  The patient does perform self breast exams.  There is no notable family history of breast or ovarian cancer in her family.  The patient wears seatbelts: yes.   The patient has regular exercise: not asked.    The patient denies current symptoms of depression.     Review of Systems: Review of Systems  Constitutional: Negative for chills and fever.  HENT: Negative for congestion.   Respiratory: Negative for cough and shortness of breath.   Cardiovascular: Negative for chest pain and palpitations.  Gastrointestinal: Negative for abdominal pain, constipation, diarrhea, heartburn, nausea and vomiting.  Genitourinary: Negative for dysuria, frequency and urgency.  Skin: Negative for itching and rash.  Neurological: Negative for dizziness and headaches.  Endo/Heme/Allergies: Negative for polydipsia.  Psychiatric/Behavioral: Negative for depression.    Past Medical History:  Past Medical History:  Diagnosis Date  . Anxiety   . Depression   . GERD (gastroesophageal reflux disease)   . Psoriasis     Past Surgical History:  Past Surgical History:  Procedure Laterality Date  . ASD REPAIR  1985  . CARPAL TUNNEL RELEASE    . CESAREAN SECTION    . COLONOSCOPY WITH PROPOFOL N/A 04/11/2017   Procedure: COLONOSCOPY WITH PROPOFOL;  Surgeon: Jonathon Bellows, MD;  Location: [facility] ENDOSCOPY;  Service:  Gastroenterology;  Laterality: N/A;  . COLPOSCOPY  2012  . ENDOMETRIAL ABLATION     Failed ablation x2 (Tech difficulties)  . LAPAROSCOPIC VAGINAL HYSTERECTOMY WITH SALPINGO OOPHORECTOMY  2012   Adenomyosis for AUB  . TONSILLECTOMY    . TUBAL LIGATION      Gynecologic History:  No LMP recorded. Patient has had a hysterectomy. Last Pap: Results were:  03/20/2018 NIL HPV negative 03/15/2017 Negative colposcopy 01/24/2017 NIL HR HPV positive 03/08/2016 Colposcopy negative and normal biopsies 01/23/2016 NIL HR HPV positive 01/11/2015 LGSIL HPV negative 10/07/2014 LGSIL HPV negative 08/11/2014 LGSIL HPV negative 05/11/2014 LGSIL HPV negative 10/29/2013 LGSIL HPV positive 11/10/2012 LGSIL HPV negative 09/30/2013 LGSIL HPV positive 03/08/2011 NIL HPV negative 02/22/2011 NIL HPV negative 12/09/2010 NIL HPV positive  Last mammogram: 05/12/2018 Results were: BI-RAD I  Obstetric History: [id number]  Family History:  Family History  Problem Relation Age of Onset  . Breast cancer Paternal Grandmother 60  . Multiple myeloma Father   . Thyroid disease Paternal Aunt     Social History:  Social History   Socioeconomic History  . Marital status: Married    Spouse name: Not on file  . Number of children: Not on file  . Years of education: Not on file  . Highest education level: Not on file  Occupational History  . Not on file  Social Needs  . Financial resource strain: Not on file  . Food insecurity    Worry: Not on file    Inability: Not on file  . Transportation needs    Medical: Not on file    Non-medical: Not on file  Tobacco Use  . Smoking status: Never Smoker  . Smokeless tobacco: Never Used  Substance and Sexual Activity  . Alcohol use: No  . Drug use: No  . Sexual activity: Yes    Partners: Male    Birth control/protection: None  Lifestyle  . Physical activity    Days per week: Not on file    Minutes per session: Not on file  . Stress: Not on file  Relationships  . Social  Herbalist on phone: Not on file    Gets together: Not on file    Attends religious service: Not on file    Active member of club or organization: Not on file    Attends meetings of clubs or organizations: Not on file    Relationship status: Not on file  . Intimate partner violence    Fear of current or ex partner: Not on file    Emotionally abused: Not on file    Physically abused: Not on file    Forced sexual activity: Not on file  Other Topics Concern  . Not on file  Social History Narrative  . Not on file    Allergies:  No Known Allergies  Medications: Prior to Admission medications   Medication Sig Start Date End Date Taking? Authorizing Provider  Cholecalciferol (VITAMIN D-1000 MAX ST) 1000 units tablet Take by mouth.    [provider]  clonazePAM (KLONOPIN) 0.5 MG tablet Take 1 tablet (0.5 mg total) by mouth 2 (two) times daily as needed for anxiety. 10/28/18   Ronnell Freshwater, NP  clotrimazole-betamethasone (LOTRISONE) cream Apply 1 application topically 2 (two) times daily. 03/06/18   Ronnell Freshwater, NP  conjugated estrogens (PREMARIN) vaginal cream Place 0.5 Applicatorfuls vaginally 2 (two) times a week. 08/21/18   Malachy Mood, MD  ergocalciferol (DRISDOL) 1.25 MG (50000 UT) capsule Take 1 capsule (50,000 Units total) by mouth once a week. 10/28/18   Ronnell Freshwater, NP  etodolac (LODINE) 400 MG tablet TK 1 T PO BID 10/04/18   [provider]  FLUoxetine (PROZAC) 20 MG capsule Take 1 capsule (20 mg total) by mouth daily. 02/16/19   Ronnell Freshwater, NP  omeprazole (PRILOSEC) 20 MG capsule Take 20 mg by mouth daily.  11/06/16   [provider]  phentermine (ADIPEX-P) 37.5 MG tablet Take 1 tablet (37.5 mg total) by mouth daily before breakfast. 10/28/18   Ronnell Freshwater, NP    Physical Exam Vitals: Blood pressure 110/82, pulse 78, height 5' (1.524 m), weight 172 lb (78 kg).  General: NAD, appears stated age, well nourished  HEENT: normocephalic, anicteric Thyroid: no enlargement, no palpable nodules Pulmonary: No increased work of breathing, CTAB Cardiovascular: RRR, distal pulses 2+ Breast: Breast symmetrical, no tenderness, no palpable nodules or masses, no skin or nipple retraction present, no nipple discharge.  No axillary or supraclavicular lymphadenopathy. Abdomen: NABS, soft, non-tender, non-distended.  Umbilicus without lesions.  No hepatomegaly, splenomegaly or masses palpable. No evidence of hernia  Genitourinary:  External: Normal external female genitalia.  Normal urethral meatus, normal Bartholin's and Skene's glands.    Vagina: Normal vaginal mucosa, no evidence of prolapse.    Cervix: Grossly normal in appearance, no bleeding  Uterus: surgically absent  Adnexa: ovaries non-enlarged, no adnexal masses  Rectal: deferred  Lymphatic: no evidence of inguinal lymphadenopathy Extremities: no edema, erythema, or tenderness Neurologic: Grossly intact Psychiatric: mood appropriate, affect full  Female chaperone present for pelvic and breast  portions of the  physical exam     Assessment: 53 y.o. G3P3003 routine annual exam  Plan: Problem List Items Addressed This Visit      Other   History of abnormal cervical Pap smear    Other Visit Diagnoses    Encounter for gynecological examination without abnormal finding    -  Primary   Flu vaccine need       Screening for malignant neoplasm of cervix       Relevant Orders   Cytology - PAP   Breast screening       Relevant Orders   MM 3D SCREEN BREAST BILATERAL      1) Mammogram - recommend yearly screening mammogram.  Mammogram Is up to date  2) STI screening  was notoffered and therefore not obtained  3) ASCCP guidelines and rational discussed.  Patient opts for every 3 years screening interval  4) Osteoporosis  - per USPTF routine screening DEXA at age 32  5) Routine healthcare maintenance including cholesterol, diabetes screening  discussed managed by PCP - influenza vaccination  6) Colonoscopy UTD 04/11/2017  7) Return in about 1 year (around 04/01/2020) for annual.    Malachy Mood, MD Mosetta Pigeon, [facility] 04/02/2019, 10:22 AM

## 2019-04-03 LAB — CYTOLOGY - PAP
Diagnosis: NEGATIVE
High risk HPV: NEGATIVE
Molecular Disclaimer: 56
Molecular Disclaimer: NORMAL

## 2019-04-09 ENCOUNTER — Other Ambulatory Visit: Payer: Self-pay | Admitting: Nurse Practitioner

## 2019-04-09 DIAGNOSIS — F411 Generalized anxiety disorder: Secondary | ICD-10-CM

## 2019-04-09 MED ORDER — CLONAZEPAM 0.5 MG PO TABS: 0.5000 mg | ORAL_TABLET | Freq: Two times a day (BID) | ORAL | 3 refills | Status: DC | PRN

## 2019-04-09 NOTE — Progress Notes (Signed)
Renewed prescription for clonazepam 0.5mg  as requested per pharmacy request

## 2019-04-27 ENCOUNTER — Ambulatory Visit (INDEPENDENT_AMBULATORY_CARE_PROVIDER_SITE_OTHER): Payer: 59 | Admitting: Nurse Practitioner

## 2019-04-27 ENCOUNTER — Encounter: Payer: Self-pay | Admitting: Nurse Practitioner

## 2019-04-27 ENCOUNTER — Other Ambulatory Visit: Payer: Self-pay

## 2019-04-27 DIAGNOSIS — Z20822 Contact with and (suspected) exposure to covid-19: Secondary | ICD-10-CM

## 2019-04-27 DIAGNOSIS — J069 Acute upper respiratory infection, unspecified: Secondary | ICD-10-CM | POA: Diagnosis not present

## 2019-04-27 DIAGNOSIS — Z20828 Contact with and (suspected) exposure to other viral communicable diseases: Secondary | ICD-10-CM

## 2019-04-27 MED ORDER — AMOXICILLIN 875 MG PO TABS: 875.0000 mg | ORAL_TABLET | Freq: Two times a day (BID) | ORAL | 0 refills | Status: DC

## 2019-04-27 NOTE — Progress Notes (Signed)
[facility] [place], [place] [address]  Internal MEDICINE  Telephone Visit  Patient Name: Kristine Stephens  [id number]  [id number]  Date of Service: 04/27/2019  I connected with the patient at 1:54pm by webcam and verified the patients identity using two identifiers.   I discussed the limitations, risks, security and privacy concerns of performing an evaluation and management service by webcam and the availability of in person appointments. I also discussed with the patient that there may be a patient responsible charge related to the service.  The patient expressed understanding and agrees to proceed.    Chief Complaint  Patient presents with  . Telephone Assessment  . Telephone Screen  . Sore Throat  . Ear Pain    hurts under the ear by lymph node   . Headache  . Nose Problem    hurts in nasal cavity     The patient has been contacted via webcam for follow up visit due to concerns for spread of novel coronavirus. The patient states that she started getting sore throat yesterday, getting worse today. Started bothering the right ear. Has a little headache. She states the lymph node on the right side of her neck is enlarged. She is unsure if she has a fever, but does not believe so. She is currently at work. She states that no one around her has been sick. She has been wearing N95 mask along with face shield while at work. 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 She does state that there are several people out of work today and all are being tested for Connell 19.       Current Medication: Outpatient Encounter Medications as of 04/27/2019  Medication Sig Note  . Cholecalciferol (VITAMIN D-1000 MAX ST) 1000 units tablet Take by mouth.   . clonazePAM (KLONOPIN) 0.5 MG tablet Take 1 tablet (0.5 mg total) by mouth 2 (two) times daily as needed for anxiety.   . conjugated estrogens (PREMARIN) vaginal cream Place 0.5 Applicatorfuls vaginally 2  (two) times a week.   . ergocalciferol (DRISDOL) 1.25 MG (50000 UT) capsule Take 1 capsule (50,000 Units total) by mouth once a week.   . etanercept (ENBREL) 50 MG/ML injection Inject 50 mg into the skin once a week.   . etodolac (LODINE) 400 MG tablet TK 1 T PO BID   . FLUoxetine (PROZAC) 20 MG capsule Take 1 capsule (20 mg total) by mouth daily.   Marland Kitchen omeprazole (PRILOSEC) 20 MG capsule Take 20 mg by mouth daily.    . phentermine (ADIPEX-P) 37.5 MG tablet Take 1 tablet (37.5 mg total) by mouth daily before breakfast. 04/02/2019: No all the time  . amoxicillin (AMOXIL) 875 MG tablet Take 1 tablet (875 mg total) by mouth 2 (two) times daily.   . [DISCONTINUED] clotrimazole-betamethasone (LOTRISONE) cream Apply 1 application topically 2 (two) times daily. (Patient not taking: Reported on 04/27/2019)    No facility-administered encounter medications on file as of 04/27/2019.     Surgical History: Past Surgical History:  Procedure Laterality Date  . ASD REPAIR  1985  . CARPAL TUNNEL RELEASE    . CESAREAN SECTION    . COLONOSCOPY WITH PROPOFOL N/A 04/11/2017   Procedure: COLONOSCOPY WITH PROPOFOL;  Surgeon: Jonathon Bellows, MD;  Location[facility] ENDOSCOPY;  Service: Gastroenterology;  Laterality: N/A;  . COLPOSCOPY  2012  . ENDOMETRIAL ABLATION     Failed ablation x2 (Tech difficulties)  . LAPAROSCOPIC VAGINAL HYSTERECTOMY WITH  SALPINGO OOPHORECTOMY  2012   Adenomyosis for AUB  . TONSILLECTOMY    . TUBAL LIGATION      Medical History: Past Medical History:  Diagnosis Date  . Anxiety   . Depression   . GERD (gastroesophageal reflux disease)   . Psoriasis     Family History: Family History  Problem Relation Age of Onset  . Breast cancer Paternal Grandmother 23  . Multiple myeloma Father   . Thyroid disease Paternal Aunt     Social History   Socioeconomic History  . Marital status: Married    Spouse name: Not on file  . Number of children: Not on file  . Years of education: Not on  file  . Highest education level: Not on file  Occupational History  . Not on file  Social Needs  . Financial resource strain: Not on file  . Food insecurity    Worry: Not on file    Inability: Not on file  . Transportation needs    Medical: Not on file    Non-medical: Not on file  Tobacco Use  . Smoking status: Never Smoker  . Smokeless tobacco: Never Used  Substance and Sexual Activity  . Alcohol use: No  . Drug use: No  . Sexual activity: Yes    Partners: Male    Birth control/protection: None  Lifestyle  . Physical activity    Days per week: Not on file    Minutes per session: Not on file  . Stress: Not on file  Relationships  . Social Herbalist on phone: Not on file    Gets together: Not on file    Attends religious service: Not on file    Active member of club or organization: Not on file    Attends meetings of clubs or organizations: Not on file    Relationship status: Not on file  . Intimate partner violence    Fear of current or ex partner: Not on file    Emotionally abused: Not on file    Physically abused: Not on file    Forced sexual activity: Not on file  Other Topics Concern  . Not on file  Social History Narrative  . Not on file      Review of Systems  Constitutional: Positive for chills. Negative for fatigue and fever.  HENT: Positive for ear pain, sinus pain and sore throat. Negative for congestion, postnasal drip, rhinorrhea and voice change.   Respiratory: Negative for cough and wheezing.   Cardiovascular: Negative for chest pain and palpitations.  Gastrointestinal: Negative for nausea and vomiting.  Musculoskeletal: Positive for arthralgias and myalgias.  Skin: Negative.   Allergic/Immunologic: Negative.   Neurological: Positive for headaches.  Hematological: Positive for adenopathy.       Right side of her neck swollen.     Vital Signs: There were no vitals taken for this visit.   Observation/Objective:   The patient  is alert and oriented. She is pleasant and answers all questions appropriately. Breathing is non-labored. She is in no acute distress at this time.  The patient appears and sounds congested. No cough appreciated at this time.   Assessment/Plan: 1. Acute upper respiratory infection Start amoxicillin 870m twice daily for 10 days. Recommend use of OTC medication to improve acute symptoms. 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  - amoxicillin (AMOXIL) 875 MG tablet; Take 1 tablet (875 mg total) by mouth 2 (two) times  daily.  Dispense: 20 tablet; Refill: 0 - Novel Coronavirus, NAA ([facility])  2. 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  She should self-quarantine until results are back. Will extend self-quarnatine depending on results.  - Novel Coronavirus, NAA ([facility])  General Counseling: karletta millay understanding of the findings of today's phone visit and agrees with plan of treatment. I have discussed any further diagnostic evaluation that may be needed or ordered today. We also reviewed her medications today. she has been encouraged to call the office with any questions or concerns that should arise related to todays visit.     Person Under Monitoring Name: ELLARIE PICKING  Location: [place] [address]   Infection Prevention Recommendations for Individuals Confirmed to have, or Being Evaluated for, 2019 Novel Coronavirus (COVID-19) Infection Who Receive Care at Home  Individuals who are confirmed to have, or are being evaluated for, COVID-19 should follow the prevention steps below until a healthcare provider or local or state health department says they can return to normal activities.  Stay home except to get medical care You should restrict activities outside your home, except for getting medical care. Do not go to work, school, or public areas, and do not  use public transportation or taxis.  Call ahead before visiting your doctor Before your medical appointment, call the healthcare provider and tell them that you have, or are being evaluated for, COVID-19 infection. This will help the healthcare provider's office take steps to keep other people from getting infected. Ask your healthcare provider to call the local or state health department.  Monitor your symptoms Seek prompt medical attention if your illness is worsening (e.g., difficulty breathing). Before going to your medical appointment, call the healthcare provider and tell them that you have, or are being evaluated for, COVID-19 infection. Ask your healthcare provider to call the local or state health department.  Wear a facemask You should wear a facemask that covers your nose and mouth when you are in the same room with other people and when you visit a healthcare provider. People who live with or visit you should also wear a facemask while they are in the same room with you.  Separate yourself from other people in your home As much as possible, you should stay in a different room from other people in your home. Also, you should use a separate bathroom, if available.  Avoid sharing household items You should not share dishes, drinking glasses, cups, eating utensils, towels, bedding, or other items with other people in your home. After using these items, you should wash them thoroughly with soap and water.  Cover your coughs and sneezes Cover your mouth and nose with a tissue when you cough or sneeze, or you can cough or sneeze into your sleeve. Throw used tissues in a lined trash can, and immediately wash your hands with soap and water for at least 20 seconds or use an alcohol-based hand rub.  Wash your [facility] your hands often and thoroughly with soap and water for at least 20 seconds. You can use an alcohol-based hand sanitizer if soap and water are not available and if  your hands are not visibly dirty. Avoid touching your eyes, nose, and mouth with unwashed hands.   Prevention Steps for Caregivers and Household Members of Individuals Confirmed to have, or Being Evaluated for, COVID-19 Infection Being Cared for in the Home  If you  live with, or provide care at home for, a person confirmed to have, or being evaluated for, COVID-19 infection please follow these guidelines to prevent infection:  Follow healthcare provider's instructions Make sure that you understand and can help the patient follow any healthcare provider instructions for all care.  Provide for the patient's basic needs You should help the patient with basic needs in the home and provide support for getting groceries, prescriptions, and other personal needs.  Monitor the patient's symptoms If they are getting sicker, call his or her medical provider and tell them that the patient has, or is being evaluated for, COVID-19 infection. This will help the healthcare provider's office take steps to keep other people from getting infected. Ask the healthcare provider to call the local or state health department.  Limit the number of people who have contact with the patient  If possible, have only one caregiver for the patient.  Other household members should stay in another home or place of residence. If this is not possible, they should stay  in another room, or be separated from the patient as much as possible. Use a separate bathroom, if available.  Restrict visitors who do not have an essential need to be in the home.  Keep older adults, very young children, and other sick people away from the patient Keep older adults, very young children, and those who have compromised immune systems or chronic health conditions away from the patient. This includes people with chronic heart, lung, or kidney conditions, diabetes, and cancer.  Ensure good ventilation Make sure that shared spaces in the  home have good air flow, such as from an air conditioner or an opened window, weather permitting.  Wash your hands often  Wash your hands often and thoroughly with soap and water for at least 20 seconds. You can use an alcohol based hand sanitizer if soap and water are not available and if your hands are not visibly dirty.  Avoid touching your eyes, nose, and mouth with unwashed hands.  Use disposable paper towels to dry your hands. If not available, use dedicated cloth towels and replace them when they become wet.  Wear a facemask and gloves  Wear a disposable facemask at all times in the room and gloves when you touch or have contact with the patient's blood, body fluids, and/or secretions or excretions, such as sweat, saliva, sputum, nasal mucus, vomit, urine, or feces.  Ensure the mask fits over your nose and mouth tightly, and do not touch it during use.  Throw out disposable facemasks and gloves after using them. Do not reuse.  Wash your hands immediately after removing your facemask and gloves.  If your personal clothing becomes contaminated, carefully remove clothing and launder. Wash your hands after handling contaminated clothing.  Place all used disposable facemasks, gloves, and other waste in a lined container before disposing them with other household waste.  Remove gloves and wash your hands immediately after handling these items.  Do not share dishes, glasses, or other household items with the patient  Avoid sharing household items. You should not share dishes, drinking glasses, cups, eating utensils, towels, bedding, or other items with a patient who is confirmed to have, or being evaluated for, COVID-19 infection.  After the person uses these items, you should wash them thoroughly with soap and water.  Wash laundry thoroughly  Immediately remove and wash clothes or bedding that have blood, body fluids, and/or secretions or excretions, such as sweat, saliva, sputum,  nasal mucus, vomit, urine, or feces, on them.  Wear gloves when handling laundry from the patient.  Read and follow directions on labels of laundry or clothing items and detergent. In general, wash and dry with the warmest temperatures recommended on the label.  Clean all areas the individual has used often  Clean all touchable surfaces, such as counters, tabletops, doorknobs, bathroom fixtures, toilets, phones, keyboards, tablets, and bedside tables, every day. Also, clean any surfaces that may have blood, body fluids, and/or secretions or excretions on them.  Wear gloves when cleaning surfaces the patient has come in contact with.  Use a diluted bleach solution (e.g., dilute bleach with 1 part bleach and 10 parts water) or a household disinfectant with a label that says EPA-registered for coronaviruses. To make a bleach solution at home, add 1 tablespoon of bleach to 1 quart (4 cups) of water. For a larger supply, add  cup of bleach to 1 gallon (16 cups) of water.  Read labels of cleaning products and follow recommendations provided on product labels. Labels contain instructions for safe and effective use of the cleaning product including precautions you should take when applying the product, such as wearing gloves or eye protection and making sure you have good ventilation during use of the product.  Remove gloves and wash hands immediately after cleaning.  Monitor yourself for signs and symptoms of illness Caregivers and household members are considered close contacts, should monitor their health, and will be asked to limit movement outside of the home to the extent possible. Follow the monitoring steps for close contacts listed on the symptom monitoring form.   ? If you have additional questions, contact your local health department or call the epidemiologist on call at [phone] (available 24/7). ? This guidance is subject to change. For the most up-to-date guidance from [facility],  please refer to their website: YouBlogs.pl  This patient was seen by Leretha Pol FNP Collaboration with Dr Lavera Guise as a part of collaborative care agreement  Orders Placed This Encounter  Procedures  . Novel Coronavirus, NAA ([facility])    Meds ordered this encounter  Medications  . amoxicillin (AMOXIL) 875 MG tablet    Sig: Take 1 tablet (875 mg total) by mouth 2 (two) times daily.    Dispense:  20 tablet    Refill:  0    Order Specific Question:   Supervising Provider    Answer:   Lavera Guise [[id number]]    Time spent: 6 Minutes    Dr Lavera Guise Internal medicine

## 2019-04-29 ENCOUNTER — Other Ambulatory Visit: Payer: Self-pay

## 2019-04-29 DIAGNOSIS — E559 Vitamin D deficiency, unspecified: Secondary | ICD-10-CM

## 2019-04-29 LAB — NOVEL CORONAVIRUS, NAA: SARS-CoV-2, NAA: NOT DETECTED

## 2019-04-29 MED ORDER — ERGOCALCIFEROL 1.25 MG (50000 UT) PO CAPS: 50000.0000 [IU] | ORAL_CAPSULE | ORAL | 5 refills | Status: DC

## 2019-04-29 NOTE — Progress Notes (Signed)
Please let the patient know that her COVID 19 test was negative. Thanks.

## 2019-05-19 ENCOUNTER — Encounter: Payer: Self-pay | Admitting: Nurse Practitioner

## 2019-05-19 ENCOUNTER — Ambulatory Visit: Payer: 59 | Admitting: Nurse Practitioner

## 2019-05-19 ENCOUNTER — Other Ambulatory Visit: Payer: Self-pay

## 2019-05-19 VITALS — BP 112/74 | HR 82 | Temp 97.6°F | Resp 16 | Ht 60.0 in | Wt 170.4 lb

## 2019-05-19 DIAGNOSIS — F411 Generalized anxiety disorder: Secondary | ICD-10-CM

## 2019-05-19 DIAGNOSIS — E669 Obesity, unspecified: Secondary | ICD-10-CM

## 2019-05-19 DIAGNOSIS — K219 Gastro-esophageal reflux disease without esophagitis: Secondary | ICD-10-CM | POA: Diagnosis not present

## 2019-05-19 DIAGNOSIS — E559 Vitamin D deficiency, unspecified: Secondary | ICD-10-CM | POA: Diagnosis not present

## 2019-05-19 DIAGNOSIS — E668 Other obesity: Secondary | ICD-10-CM | POA: Diagnosis not present

## 2019-05-19 DIAGNOSIS — R69 Illness, unspecified: Secondary | ICD-10-CM | POA: Diagnosis not present

## 2019-05-19 MED ORDER — FLUOXETINE HCL 20 MG PO CAPS: 20.0000 mg | ORAL_CAPSULE | Freq: Every day | ORAL | 3 refills | Status: DC

## 2019-05-19 MED ORDER — PHENTERMINE HCL 37.5 MG PO TABS: 37.5000 mg | ORAL_TABLET | Freq: Every day | ORAL | 1 refills | Status: DC

## 2019-05-19 NOTE — Progress Notes (Signed)
[facility] [place], [place] [address]  Internal MEDICINE  Office Visit Note  Patient Name: Kristine Stephens  [id number]  [id number]  Date of Service: 06/05/2019  Chief Complaint  Patient presents with  . Anxiety  . Depression  . Gastroesophageal Reflux  . Labs Only    vitamin d check     The patient is here for routine follow up. She has well managed anxiety/depression. Currently on prozac 23m daily. Will, sometimes, take clonazepam if needed for acute anxiety. She has taken phentermine off and on for some time. She has not been taking recently. She has lost one pound since her last visit. She has lost once pound since her last visit in the office. She would like to restart phentermine to help her lose weight. She denies negative side effects from taking phentermine.      Current Medication: Outpatient Encounter Medications as of 05/19/2019  Medication Sig Note  . b complex vitamins capsule Take 1 capsule by mouth daily.   . clonazePAM (KLONOPIN) 0.5 MG tablet Take 1 tablet (0.5 mg total) by mouth 2 (two) times daily as needed for anxiety.   . conjugated estrogens (PREMARIN) vaginal cream Place 0.5 Applicatorfuls vaginally 2 (two) times a week.   . ergocalciferol (DRISDOL) 1.25 MG (50000 UT) capsule Take 1 capsule (50,000 Units total) by mouth once a week.   . etanercept (ENBREL) 50 MG/ML injection Inject 50 mg into the skin once a week.   . etodolac (LODINE) 400 MG tablet TK 1 T PO BID   . FLUoxetine (PROZAC) 20 MG capsule Take 1 capsule (20 mg total) by mouth daily.   .Marland Kitchenomeprazole (PRILOSEC) 20 MG capsule Take 20 mg by mouth daily.    . phentermine (ADIPEX-P) 37.5 MG tablet Take 1 tablet (37.5 mg total) by mouth daily before breakfast.   . [DISCONTINUED] FLUoxetine (PROZAC) 20 MG capsule Take 1 capsule (20 mg total) by mouth daily.   . [DISCONTINUED] phentermine (ADIPEX-P) 37.5 MG tablet Take 1 tablet (37.5 mg total) by mouth daily before breakfast.  04/02/2019: No all the time  . [DISCONTINUED] amoxicillin (AMOXIL) 875 MG tablet Take 1 tablet (875 mg total) by mouth 2 (two) times daily. (Patient not taking: Reported on 05/19/2019)   . [DISCONTINUED] Cholecalciferol (VITAMIN D-1000 MAX ST) 1000 units tablet Take by mouth.    No facility-administered encounter medications on file as of 05/19/2019.     Surgical History: Past Surgical History:  Procedure Laterality Date  . ASD REPAIR  1985  . CARPAL TUNNEL RELEASE    . CESAREAN SECTION    . COLONOSCOPY WITH PROPOFOL N/A 04/11/2017   Procedure: COLONOSCOPY WITH PROPOFOL;  Surgeon: AJonathon Bellows MD;  [facility]  Service: Gastroenterology;  Laterality: N/A;  . COLPOSCOPY  2012  . ENDOMETRIAL ABLATION     Failed ablation x2 (Tech difficulties)  . LAPAROSCOPIC VAGINAL HYSTERECTOMY WITH SALPINGO OOPHORECTOMY  2012   Adenomyosis for AUB  . TONSILLECTOMY    . TUBAL LIGATION      Medical History: Past Medical History:  Diagnosis Date  . Anxiety   . Depression   . GERD (gastroesophageal reflux disease)   . Psoriasis     Family History: Family History  Problem Relation Age of Onset  . Breast cancer Paternal Grandmother 666 . Multiple myeloma Father   . Thyroid disease Paternal Aunt     Social History   Socioeconomic History  . Marital status: Married    Spouse  name: Not on file  . Number of children: Not on file  . Years of education: Not on file  . Highest education level: Not on file  Occupational History  . Not on file  Social Needs  . Financial resource strain: Not on file  . Food insecurity    Worry: Not on file    Inability: Not on file  . Transportation needs    Medical: Not on file    Non-medical: Not on file  Tobacco Use  . Smoking status: Never Smoker  . Smokeless tobacco: Never Used  Substance and Sexual Activity  . Alcohol use: No  . Drug use: No  . Sexual activity: Yes    Partners: Male    Birth control/protection: None  Lifestyle  .  Physical activity    Days per week: Not on file    Minutes per session: Not on file  . Stress: Not on file  Relationships  . Social Herbalist on phone: Not on file    Gets together: Not on file    Attends religious service: Not on file    Active member of club or organization: Not on file    Attends meetings of clubs or organizations: Not on file    Relationship status: Not on file  . Intimate partner violence    Fear of current or ex partner: Not on file    Emotionally abused: Not on file    Physically abused: Not on file    Forced sexual activity: Not on file  Other Topics Concern  . Not on file  Social History Narrative  . Not on file      Review of Systems  Constitutional: Negative for chills, fatigue and unexpected weight change.  HENT: Negative for congestion, postnasal drip, rhinorrhea, sneezing and sore throat.   Respiratory: Negative for cough, chest tightness and shortness of breath.   Cardiovascular: Negative for chest pain and palpitations.  Gastrointestinal: Negative for abdominal pain, constipation, diarrhea, nausea and vomiting.  Endocrine: Negative for cold intolerance, heat intolerance, polydipsia and polyuria.  Musculoskeletal: Positive for arthralgias and myalgias. Negative for back pain, joint swelling and neck pain.  Skin: Negative for rash.  Allergic/Immunologic: Negative for environmental allergies.  Neurological: Negative for dizziness, tremors, numbness and headaches.  Hematological: Negative for adenopathy. Does not bruise/bleed easily.  Psychiatric/Behavioral: Positive for dysphoric mood. Negative for behavioral problems (Depression), sleep disturbance and suicidal ideas. The patient is nervous/anxious.     Today's Vitals   05/19/19 1615  BP: 112/74  Pulse: 82  Resp: 16  Temp: 97.6 F (36.4 C)  SpO2: 96%  Weight: 170 lb 6.4 oz (77.3 kg)  Height: 5' (1.524 m)   Body mass index is 33.28 kg/m.   Physical Exam Vitals signs and  nursing note reviewed.  Constitutional:      General: She is not in acute distress.    Appearance: Normal appearance. She is well-developed. She is obese. She is not diaphoretic.  HENT:     Head: Normocephalic and atraumatic.     Nose: Nose normal.     Mouth/Throat:     Pharynx: No oropharyngeal exudate.  Eyes:     Extraocular Movements: Extraocular movements intact.     Pupils: Pupils are equal, round, and reactive to light.  Neck:     Musculoskeletal: Normal range of motion and neck supple.     Thyroid: No thyromegaly.     Vascular: No JVD.     Trachea: No  tracheal deviation.  Cardiovascular:     Rate and Rhythm: Normal rate and regular rhythm.     Heart sounds: Normal heart sounds. No murmur. No friction rub. No gallop.   Pulmonary:     Effort: Pulmonary effort is normal. No respiratory distress.     Breath sounds: Normal breath sounds. No wheezing or rales.  Chest:     Chest wall: No tenderness.  Abdominal:     Palpations: Abdomen is soft.  Musculoskeletal: Normal range of motion.  Lymphadenopathy:     Cervical: No cervical adenopathy.  Skin:    General: Skin is warm and dry.  Neurological:     Mental Status: She is alert and oriented to person, place, and time.     Cranial Nerves: No cranial nerve deficit.  Psychiatric:        Attention and Perception: Attention and perception normal.        Mood and Affect: Affect normal. Mood is anxious and depressed.        Speech: Speech normal.        Behavior: Behavior normal.        Thought Content: Thought content normal.        Cognition and Memory: Cognition and memory normal.        Judgment: Judgment normal.   Assessment/Plan:  1. Gastroesophageal reflux disease without esophagitis Continue omeprazole as prescribed   2. Moderate obesity Restart phentermine 37.87m tablets daily. Limit calorie intake to 1200-1500 calories per day and incorporate exercise into daily routine. - phentermine (ADIPEX-P) 37.5 MG tablet;  Take 1 tablet (37.5 mg total) by mouth daily before breakfast.  Dispense: 30 tablet; Refill: 1  3. GAD (generalized anxiety disorder) Stable. Continue prozac 272mdaily.  - FLUoxetine (PROZAC) 20 MG capsule; Take 1 capsule (20 mg total) by mouth daily.  Dispense: 30 capsule; Refill: 3  4. Vitamin D deficiency Check labs including vitamin d level. Treat as indicated.   General Counseling: Wesekai nayaknderstanding of the findings of todays visit and agrees with plan of treatment. I have discussed any further diagnostic evaluation that may be needed or ordered today. We also reviewed her medications today. she has been encouraged to call the office with any questions or concerns that should arise related to todays visit.   There is a liability release in patients' chart. There has been a 10 minute discussion about the side effects including but not limited to elevated blood pressure, anxiety, lack of sleep and dry mouth. Pt understands and will like to start/continue on appetite suppressant at this time. There will be one month RX given at the time of visit with proper follow up. Nova diet plan with restricted calories is given to the pt. Pt understands and agrees with  plan of treatment  This patient was seen by HeLeretha PolNP Collaboration with Dr FoLavera Guises a part of collaborative care agreement  Meds ordered this encounter  Medications  . FLUoxetine (PROZAC) 20 MG capsule    Sig: Take 1 capsule (20 mg total) by mouth daily.    Dispense:  30 capsule    Refill:  3    Order Specific Question:   Supervising Provider    Answer:   KHLavera Guise1[[id number]]. phentermine (ADIPEX-P) 37.5 MG tablet    Sig: Take 1 tablet (37.5 mg total) by mouth daily before breakfast.    Dispense:  30 tablet    Refill:  1    Order Specific Question:  Supervising Provider    Answer:   Lavera Guise [[id number]]    Time spent: 25 Minutes      Dr Lavera Guise Internal medicine

## 2019-06-10 ENCOUNTER — Other Ambulatory Visit: Payer: Self-pay | Admitting: Nurse Practitioner

## 2019-06-10 DIAGNOSIS — Z0001 Encounter for general adult medical examination with abnormal findings: Secondary | ICD-10-CM | POA: Diagnosis not present

## 2019-06-10 DIAGNOSIS — R7301 Impaired fasting glucose: Secondary | ICD-10-CM | POA: Diagnosis not present

## 2019-06-10 DIAGNOSIS — E782 Mixed hyperlipidemia: Secondary | ICD-10-CM | POA: Diagnosis not present

## 2019-06-10 DIAGNOSIS — E559 Vitamin D deficiency, unspecified: Secondary | ICD-10-CM | POA: Diagnosis not present

## 2019-06-11 LAB — LIPID PANEL W/O CHOL/HDL RATIO
Cholesterol, Total: 229 mg/dL — ABNORMAL HIGH (ref 100–199)
HDL: 46 mg/dL (ref >39)
LDL Chol Calc (NIH): 151 mg/dL — ABNORMAL HIGH (ref 0–99)
Triglycerides: 175 mg/dL — ABNORMAL HIGH (ref 0–149)
VLDL Cholesterol Cal: 32 mg/dL (ref 5–40)

## 2019-06-11 LAB — COMPREHENSIVE METABOLIC PANEL
ALT: 19 IU/L (ref 0–32)
AST: 23 IU/L (ref 0–40)
Albumin/Globulin Ratio: 1.8 (ref 1.2–2.2)
Albumin: 4.4 g/dL (ref 3.8–4.9)
Alkaline Phosphatase: 96 IU/L (ref 39–117)
BUN/Creatinine Ratio: 18 (ref 9–23)
BUN: 12 mg/dL (ref 6–24)
Bilirubin Total: 0.3 mg/dL (ref 0.0–1.2)
CO2: 26 mmol/L (ref 20–29)
Calcium: 9.2 mg/dL (ref 8.7–10.2)
Chloride: 98 mmol/L (ref 96–106)
Creatinine, Ser: 0.65 mg/dL (ref 0.57–1.00)
GFR calc Af Amer: 117 mL/min/{1.73_m2} (ref >59)
GFR calc non Af Amer: 102 mL/min/{1.73_m2} (ref >59)
Globulin, Total: 2.5 g/dL (ref 1.5–4.5)
Glucose: 116 mg/dL — ABNORMAL HIGH (ref 65–99)
Potassium: 4.3 mmol/L (ref 3.5–5.2)
Sodium: 137 mmol/L (ref 134–144)
Total Protein: 6.9 g/dL (ref 6.0–8.5)

## 2019-06-11 LAB — TSH: TSH: 3.15 u[IU]/mL (ref 0.450–4.500)

## 2019-06-11 LAB — T4, FREE: Free T4: 0.95 ng/dL (ref 0.82–1.77)

## 2019-06-11 LAB — VITAMIN D 25 HYDROXY (VIT D DEFICIENCY, FRACTURES): Vit D, 25-Hydroxy: 28.1 ng/mL — ABNORMAL LOW (ref 30.0–100.0)

## 2019-06-11 LAB — HGB A1C W/O EAG: Hgb A1c MFr Bld: 6.1 % — ABNORMAL HIGH (ref 4.8–5.6)

## 2019-06-11 NOTE — Progress Notes (Signed)
Mild generalized elevation of lipid panel with midly low vitamin d level. Discuss with patient at visit 07/21/2019

## 2019-06-17 ENCOUNTER — Ambulatory Visit
Admission: RE | Admit: 2019-06-17 | Discharge: 2019-06-17 | Disposition: A | Discharge status: Home / Self Care | Payer: 59 | Source: Ambulatory Visit | Attending: Obstetrics and Gynecology | Admitting: Obstetrics and Gynecology

## 2019-06-17 DIAGNOSIS — Z1239 Encounter for other screening for malignant neoplasm of breast: Secondary | ICD-10-CM

## 2019-06-17 DIAGNOSIS — Z1231 Encounter for screening mammogram for malignant neoplasm of breast: Secondary | ICD-10-CM | POA: Diagnosis not present

## 2019-07-15 DIAGNOSIS — H9201 Otalgia, right ear: Secondary | ICD-10-CM | POA: Diagnosis not present

## 2019-07-15 DIAGNOSIS — R519 Headache, unspecified: Secondary | ICD-10-CM | POA: Diagnosis not present

## 2019-07-15 DIAGNOSIS — H6981 Other specified disorders of Eustachian tube, right ear: Secondary | ICD-10-CM | POA: Diagnosis not present

## 2019-07-16 ENCOUNTER — Telehealth: Payer: Self-pay

## 2019-07-16 NOTE — Telephone Encounter (Signed)
CONFIRMED AND SCREENED FOR 07-21-19 OV. °

## 2019-07-21 ENCOUNTER — Ambulatory Visit: Payer: 59 | Admitting: Nurse Practitioner

## 2019-07-23 ENCOUNTER — Ambulatory Visit: Payer: 59 | Admitting: Adult Health

## 2019-07-23 DIAGNOSIS — K219 Gastro-esophageal reflux disease without esophagitis: Secondary | ICD-10-CM

## 2019-07-23 DIAGNOSIS — F411 Generalized anxiety disorder: Secondary | ICD-10-CM | POA: Diagnosis not present

## 2019-07-23 DIAGNOSIS — E668 Other obesity: Secondary | ICD-10-CM | POA: Diagnosis not present

## 2019-07-23 DIAGNOSIS — E559 Vitamin D deficiency, unspecified: Secondary | ICD-10-CM | POA: Diagnosis not present

## 2019-07-23 DIAGNOSIS — R69 Illness, unspecified: Secondary | ICD-10-CM | POA: Diagnosis not present

## 2019-07-23 NOTE — Progress Notes (Signed)
[facility] [place], [place] [address]  Internal MEDICINE  Telephone Visit  Patient Name: Kristine Stephens  [id number]  [id number]  Date of Service: 07/23/2019  I connected with the patient at 350 by telephone and verified the patients identity using two identifiers.   I discussed the limitations, risks, security and privacy concerns of performing an evaluation and management service by telephone and the availability of in person appointments. I also discussed with the patient that there may be a patient responsible charge related to the service.  The patient expressed understanding and agrees to proceed.    Chief Complaint  Patient presents with  . Telephone Assessment  . Telephone Screen  . Depression  . Gastroesophageal Reflux    HPI  Pt is seen via video.  She is following up on GERD and depression.  She reports she is doing well.  Denies any symptoms at this time.  Her Jerrye Bushy is well controlled with medications.  She does not need any refills on her medications currently.    Current Medication: Outpatient Encounter Medications as of 07/23/2019  Medication Sig  . b complex vitamins capsule Take 1 capsule by mouth daily.  . clonazePAM (KLONOPIN) 0.5 MG tablet Take 1 tablet (0.5 mg total) by mouth 2 (two) times daily as needed for anxiety.  . conjugated estrogens (PREMARIN) vaginal cream Place 0.5 Applicatorfuls vaginally 2 (two) times a week.  . ergocalciferol (DRISDOL) 1.25 MG (50000 UT) capsule Take 1 capsule (50,000 Units total) by mouth once a week.  . etanercept (ENBREL) 50 MG/ML injection Inject 50 mg into the skin once a week.  . etodolac (LODINE) 400 MG tablet TK 1 T PO BID  . FLUoxetine (PROZAC) 20 MG capsule Take 1 capsule (20 mg total) by mouth daily.  Marland Kitchen omeprazole (PRILOSEC) 20 MG capsule Take 20 mg by mouth daily.   . phentermine (ADIPEX-P) 37.5 MG tablet Take 1 tablet (37.5 mg total) by mouth daily before breakfast. (Patient not taking:  Reported on 07/23/2019)   No facility-administered encounter medications on file as of 07/23/2019.    Surgical History: Past Surgical History:  Procedure Laterality Date  . ASD REPAIR  1985  . CARPAL TUNNEL RELEASE    . CESAREAN SECTION    . COLONOSCOPY WITH PROPOFOL N/A 04/11/2017   Procedure: COLONOSCOPY WITH PROPOFOL;  Surgeon: Jonathon Bellows, MD;  [facility] END[facility]COPY;  Service: Gastroenterology;  Laterality: N/A;  . COLPOSCOPY  2012  . ENDOMETRIAL ABLATION     Failed ablation x2 (Tech difficulties)  . LAPAROSCOPIC VAGINAL HYSTERECTOMY WITH SALPINGO OOPHORECTOMY  2012   Adenomyosis for AUB  . TONSILLECTOMY    . TUBAL LIGATION      Medical History: Past Medical History:  Diagnosis Date  . Anxiety   . Depression   . GERD (gastroesophageal reflux disease)   . Psoriasis     Family History: Family History  Problem Relation Age of Onset  . Breast cancer Paternal Grandmother 96  . Multiple myeloma Father   . Thyroid disease Paternal Aunt     Social History   Socioeconomic History  . Marital status: Married    Spouse name: Not on file  . Number of children: Not on file  . Years of education: Not on file  . Highest education level: Not on file  Occupational History  . Not on file  Tobacco Use  . Smoking status: Never Smoker  . Smokeless tobacco: Never Used  Substance and Sexual Activity  .  Alcohol use: No  . Drug use: No  . Sexual activity: Yes    Partners: Male    Birth control/protection: None  Other Topics Concern  . Not on file  Social History Narrative  . Not on file   Social Determinants of Health   Financial Resource Strain:   . Difficulty of Paying Living Expenses: Not on file  Food Insecurity:   . Worried About Charity fundraiser in the Last Year: Not on file  . Ran Out of Food in the Last Year: Not on file  Transportation Needs:   . Lack of Transportation (Medical): Not on file  . Lack of Transportation (Non-Medical): Not on file  Physical  Activity:   . Days of Exercise per Week: Not on file  . Minutes of Exercise per Session: Not on file  Stress:   . Feeling of Stress : Not on file  Social Connections:   . Frequency of Communication with Friends and Family: Not on file  . Frequency of Social Gatherings with Friends and Family: Not on file  . Attends Religious Services: Not on file  . Active Member of Clubs or Organizations: Not on file  . Attends Archivist Meetings: Not on file  . Marital Status: Not on file  Intimate Partner Violence:   . Fear of Current or Ex-Partner: Not on file  . Emotionally Abused: Not on file  . Physically Abused: Not on file  . Sexually Abused: Not on file      Review of Systems  Constitutional: Negative for chills, fatigue and unexpected weight change.  HENT: Negative for congestion, rhinorrhea, sneezing and sore throat.   Eyes: Negative for photophobia, pain and redness.  Respiratory: Negative for cough, chest tightness and shortness of breath.   Cardiovascular: Negative for chest pain and palpitations.  Gastrointestinal: Negative for abdominal pain, constipation, diarrhea, nausea and vomiting.  Endocrine: Negative.   Genitourinary: Negative for dysuria and frequency.  Musculoskeletal: Negative for arthralgias, back pain, joint swelling and neck pain.  Skin: Negative for rash.  Allergic/Immunologic: Negative.   Neurological: Negative for tremors and numbness.  Hematological: Negative for adenopathy. Does not bruise/bleed easily.  Psychiatric/Behavioral: Negative for behavioral problems and sleep disturbance. The patient is not nervous/anxious.     Vital Signs: There were no vitals taken for this visit.   Observation/Objective:  Well appearing, NAD noted   Assessment/Plan: 1. Gastroesophageal reflux disease without esophagitis Controlled, continue present therapy.  2. GAD (generalized anxiety disorder) Controlled, continue present management.   3. Vitamin D  deficiency Continue drisdol as before.   4. Moderate obesity Obesity Counseling: Risk Assessment: An assessment of behavioral risk factors was made today and includes lack of exercise sedentary lifestyle, lack of portion control and poor dietary habits.  Risk Modification Advice: She was counseled on portion control guidelines. Restricting daily caloric intake to 1800. The detrimental long term effects of obesity on her health and ongoing poor compliance was also discussed with the patient.    General Counseling: anahli arvanitis understanding of the findings of today's phone visit and agrees with plan of treatment. I have discussed any further diagnostic evaluation that may be needed or ordered today. We also reviewed her medications today. she has been encouraged to call the office with any questions or concerns that should arise related to todays visit.    No orders of the defined types were placed in this encounter.   No orders of the defined types were placed in this encounter.  Time spent: [place] AGNP-C Internal medicine

## 2019-07-28 ENCOUNTER — Ambulatory Visit: Payer: 59 | Attending: Internal Medicine

## 2019-07-28 ENCOUNTER — Telehealth: Payer: Self-pay

## 2019-07-28 ENCOUNTER — Other Ambulatory Visit: Payer: Self-pay | Admitting: Nurse Practitioner

## 2019-07-28 DIAGNOSIS — Z20822 Contact with and (suspected) exposure to covid-19: Secondary | ICD-10-CM

## 2019-07-28 DIAGNOSIS — J0140 Acute pansinusitis, unspecified: Secondary | ICD-10-CM

## 2019-07-28 MED ORDER — FLUTICASONE PROPIONATE 50 MCG/ACT NA SUSP: 2.0000 | Freq: Every day | NASAL | 6 refills | Status: DC

## 2019-07-28 MED ORDER — AZITHROMYCIN 250 MG PO TABS: ORAL_TABLET | ORAL | 0 refills | Status: DC

## 2019-07-28 NOTE — Telephone Encounter (Signed)
Sent z-pack - take as directed for 5 days. She should rest and increase fluids. Also sent flonase nasal spray daily to help with nasal congestion. She should use over the counter medications as needed and as indicated to improve other symptoms. Rest and increase fluids.

## 2019-07-28 NOTE — Progress Notes (Signed)
Sent z-pack - take as directed for 5 days. She should rest and increase fluids. Also sent flonase nasal spray daily to help with nasal congestion. She should use over the counter medications as needed and as indicated to improve other symptoms. Rest and increase fluids.

## 2019-07-30 LAB — NOVEL CORONAVIRUS, NAA: SARS-CoV-2, NAA: NOT DETECTED

## 2019-08-04 ENCOUNTER — Other Ambulatory Visit: Payer: Self-pay

## 2019-08-04 DIAGNOSIS — F411 Generalized anxiety disorder: Secondary | ICD-10-CM

## 2019-08-06 MED ORDER — CLONAZEPAM 0.5 MG PO TABS: 0.5000 mg | ORAL_TABLET | Freq: Two times a day (BID) | ORAL | 3 refills | Status: DC | PRN

## 2019-09-15 ENCOUNTER — Telehealth: Payer: Self-pay

## 2019-09-15 NOTE — Telephone Encounter (Signed)
LMOM FOR PATIENT TO CONFIRM AND SCREEN FOR 09-17-19 OV.

## 2019-09-17 ENCOUNTER — Ambulatory Visit: Payer: 59 | Admitting: Adult Health

## 2019-09-17 ENCOUNTER — Encounter: Payer: Self-pay | Admitting: Nurse Practitioner

## 2019-09-17 ENCOUNTER — Telehealth: Payer: Self-pay

## 2019-09-17 ENCOUNTER — Other Ambulatory Visit: Payer: Self-pay

## 2019-09-17 VITALS — BP 120/74 | HR 102 | Temp 97.4°F | Resp 16 | Ht 60.0 in | Wt 172.0 lb

## 2019-09-17 DIAGNOSIS — R69 Illness, unspecified: Secondary | ICD-10-CM | POA: Diagnosis not present

## 2019-09-17 DIAGNOSIS — Z79899 Other long term (current) drug therapy: Secondary | ICD-10-CM | POA: Diagnosis not present

## 2019-09-17 DIAGNOSIS — K219 Gastro-esophageal reflux disease without esophagitis: Secondary | ICD-10-CM | POA: Diagnosis not present

## 2019-09-17 DIAGNOSIS — Z6833 Body mass index (BMI) 33.0-33.9, adult: Secondary | ICD-10-CM

## 2019-09-17 DIAGNOSIS — F411 Generalized anxiety disorder: Secondary | ICD-10-CM | POA: Diagnosis not present

## 2019-09-17 DIAGNOSIS — M1990 Unspecified osteoarthritis, unspecified site: Secondary | ICD-10-CM

## 2019-09-17 LAB — POCT URINE DRUG SCREEN
POC Amphetamine UR: POSITIVE — AB
POC BENZODIAZEPINES UR: NOT DETECTED
POC Barbiturate UR: NOT DETECTED
POC Cocaine UR: NOT DETECTED
POC Ecstasy UR: NOT DETECTED
POC METHADONE UR: NOT DETECTED
POC METHAMPHETAMINE UR: NOT DETECTED
POC Marijuana UR: NOT DETECTED
POC Opiate Ur: NOT DETECTED
POC Oxycodone UR: NOT DETECTED
POC PHENCYCLIDINE UR: NOT DETECTED
POC TRICYCLICS UR: NOT DETECTED

## 2019-09-17 NOTE — Telephone Encounter (Signed)
Confirmed appointment on 09/17/2019 and screened for covid. klh

## 2019-09-17 NOTE — Progress Notes (Signed)
[facility] [place], [place] [address]  Internal MEDICINE  Office Visit Note  Patient Name: Kristine Stephens  [id number]  [id number]  Date of Service: 09/17/2019  Chief Complaint  Patient presents with  . Anxiety  . Depression    HPI  PT is here for follow up on anxiety and depression.  She reports overall she is doing well.  She does not have any new complaints. Her depression is controlled with Prozac and clonazepam. She has not been hospitalized.       Current Medication: Outpatient Encounter Medications as of 09/17/2019  Medication Sig  . b complex vitamins capsule Take 1 capsule by mouth daily.  . clonazePAM (KLONOPIN) 0.5 MG tablet Take 1 tablet (0.5 mg total) by mouth 2 (two) times daily as needed for anxiety.  . conjugated estrogens (PREMARIN) vaginal cream Place 0.5 Applicatorfuls vaginally 2 (two) times a week.  . ergocalciferol (DRISDOL) 1.25 MG (50000 UT) capsule Take 1 capsule (50,000 Units total) by mouth once a week.  . etanercept (ENBREL) 50 MG/ML injection Inject 50 mg into the skin once a week.  . etodolac (LODINE) 400 MG tablet TK 1 T PO BID  . FLUoxetine (PROZAC) 20 MG capsule Take 1 capsule (20 mg total) by mouth daily.  . fluticasone (FLONASE) 50 MCG/ACT nasal spray Place 2 sprays into both nostrils daily.  Marland Kitchen omeprazole (PRILOSEC) 20 MG capsule Take 20 mg by mouth daily.   . phentermine (ADIPEX-P) 37.5 MG tablet Take 1 tablet (37.5 mg total) by mouth daily before breakfast.  . [DISCONTINUED] azithromycin (ZITHROMAX) 250 MG tablet z-pack - take as directed for 5 days for sinusitis   No facility-administered encounter medications on file as of 09/17/2019.    Surgical History: Past Surgical History:  Procedure Laterality Date  . ASD REPAIR  1985  . CARPAL TUNNEL RELEASE    . CESAREAN SECTION    . COLONOSCOPY WITH PROPOFOL N/A 04/11/2017   Procedure: COLONOSCOPY WITH PROPOFOL;  Surgeon: Jonathon Bellows, MD;  Location: [facility] ENDOSCOPY;   Service: Gastroenterology;  Laterality: N/A;  . COLPOSCOPY  2012  . ENDOMETRIAL ABLATION     Failed ablation x2 (Tech difficulties)  . LAPAROSCOPIC VAGINAL HYSTERECTOMY WITH SALPINGO OOPHORECTOMY  2012   Adenomyosis for AUB  . TONSILLECTOMY    . TUBAL LIGATION      Medical History: Past Medical History:  Diagnosis Date  . Anxiety   . Depression   . GERD (gastroesophageal reflux disease)   . Psoriasis     Family History: Family History  Problem Relation Age of Onset  . Breast cancer Paternal Grandmother 81  . Multiple myeloma Father   . Thyroid disease Paternal Aunt     Social History   Socioeconomic History  . Marital status: Married    Spouse name: Not on file  . Number of children: Not on file  . Years of education: Not on file  . Highest education level: Not on file  Occupational History  . Not on file  Tobacco Use  . Smoking status: Never Smoker  . Smokeless tobacco: Never Used  Substance and Sexual Activity  . Alcohol use: No  . Drug use: No  . Sexual activity: Yes    Partners: Male    Birth control/protection: None  Other Topics Concern  . Not on file  Social History Narrative  . Not on file   Social Determinants of Health   Financial Resource Strain:   . Difficulty of Paying Living  Expenses:   Food Insecurity:   . Worried About Charity fundraiser in the Last Year:   . Arboriculturist in the Last Year:   Transportation Needs:   . Film/video editor (Medical):   Marland Kitchen Lack of Transportation (Non-Medical):   Physical Activity:   . Days of Exercise per Week:   . Minutes of Exercise per Session:   Stress:   . Feeling of Stress :   Social Connections:   . Frequency of Communication with Friends and Family:   . Frequency of Social Gatherings with Friends and Family:   . Attends Religious Services:   . Active Member of Clubs or Organizations:   . Attends Archivist Meetings:   Marland Kitchen Marital Status:   Intimate Partner Violence:   . Fear  of Current or Ex-Partner:   . Emotionally Abused:   Marland Kitchen Physically Abused:   . Sexually Abused:       Review of Systems  Constitutional: Negative for chills, fatigue and unexpected weight change.  HENT: Negative for congestion, rhinorrhea, sneezing and sore throat.   Eyes: Negative for photophobia, pain and redness.  Respiratory: Negative for cough, chest tightness and shortness of breath.   Cardiovascular: Negative for chest pain and palpitations.  Gastrointestinal: Negative for abdominal pain, constipation, diarrhea, nausea and vomiting.  Endocrine: Negative.   Genitourinary: Negative for dysuria and frequency.  Musculoskeletal: Negative for arthralgias, back pain, joint swelling and neck pain.  Skin: Negative for rash.  Allergic/Immunologic: Negative.   Neurological: Negative for tremors and numbness.  Hematological: Negative for adenopathy. Does not bruise/bleed easily.  Psychiatric/Behavioral: Negative for behavioral problems and sleep disturbance. The patient is not nervous/anxious.     Vital Signs: BP 120/74   Pulse (!) 102   Temp (!) 97.4 F (36.3 C)   Resp 16   Ht 5' (1.524 m)   Wt 172 lb (78 kg)   SpO2 97%   BMI 33.59 kg/m    Physical Exam Vitals and nursing note reviewed.  Constitutional:      General: She is not in acute distress.    Appearance: She is well-developed. She is not diaphoretic.  HENT:     Head: Normocephalic and atraumatic.     Mouth/Throat:     Pharynx: No oropharyngeal exudate.  Eyes:     Pupils: Pupils are equal, round, and reactive to light.  Neck:     Thyroid: No thyromegaly.     Vascular: No JVD.     Trachea: No tracheal deviation.  Cardiovascular:     Rate and Rhythm: Normal rate and regular rhythm.     Heart sounds: Normal heart sounds. No murmur. No friction rub. No gallop.   Pulmonary:     Effort: Pulmonary effort is normal. No respiratory distress.     Breath sounds: Normal breath sounds. No wheezing or rales.  Chest:      Chest wall: No tenderness.  Abdominal:     Palpations: Abdomen is soft.     Tenderness: There is no abdominal tenderness. There is no guarding.  Musculoskeletal:        General: Normal range of motion.     Cervical back: Normal range of motion and neck supple.  Lymphadenopathy:     Cervical: No cervical adenopathy.  Skin:    General: Skin is warm and dry.  Neurological:     Mental Status: She is alert and oriented to person, place, and time.     Cranial Nerves: No  cranial nerve deficit.  Psychiatric:        Behavior: Behavior normal.        Thought Content: Thought content normal.        Judgment: Judgment normal.     Assessment/Plan: 1. GAD (generalized anxiety disorder) Controlled, continue present management at this time.   2. Gastroesophageal reflux disease without esophagitis Stable, continue present management.  3. Encounter for long-term (current) use of medications - POCT Urine Drug Screen  4. BMI 33.0-33.9,adult Obesity Counseling: Risk Assessment: An assessment of behavioral risk factors was made today and includes lack of exercise sedentary lifestyle, lack of portion control and poor dietary habits.  Risk Modification Advice: She was counseled on portion control guidelines. Restricting daily caloric intake to 1800. The detrimental long term effects of obesity on her health and ongoing poor compliance was also discussed with the patient.  5. Inflammatory osteoarthritis Controlled, continue present management.   General Counseling: islay polanco understanding of the findings of todays visit and agrees with plan of treatment. I have discussed any further diagnostic evaluation that may be needed or ordered today. We also reviewed her medications today. she has been encouraged to call the office with any questions or concerns that should arise related to todays visit.    Orders Placed This Encounter  Procedures  . POCT Urine Drug Screen    No orders of the  defined types were placed in this encounter.   Time spent: 25 Minutes   This patient was seen by Orson Gear AGNP-C in Collaboration with Dr Lavera Guise as a part of collaborative care agreement     Kendell Bane AGNP-C Internal medicine

## 2019-09-21 DIAGNOSIS — M5416 Radiculopathy, lumbar region: Secondary | ICD-10-CM | POA: Diagnosis not present

## 2019-09-21 DIAGNOSIS — M9905 Segmental and somatic dysfunction of pelvic region: Secondary | ICD-10-CM | POA: Diagnosis not present

## 2019-09-21 DIAGNOSIS — M955 Acquired deformity of pelvis: Secondary | ICD-10-CM | POA: Diagnosis not present

## 2019-09-21 DIAGNOSIS — M9903 Segmental and somatic dysfunction of lumbar region: Secondary | ICD-10-CM | POA: Diagnosis not present

## 2019-09-23 DIAGNOSIS — M955 Acquired deformity of pelvis: Secondary | ICD-10-CM | POA: Diagnosis not present

## 2019-09-23 DIAGNOSIS — M9905 Segmental and somatic dysfunction of pelvic region: Secondary | ICD-10-CM | POA: Diagnosis not present

## 2019-09-23 DIAGNOSIS — M5416 Radiculopathy, lumbar region: Secondary | ICD-10-CM | POA: Diagnosis not present

## 2019-09-23 DIAGNOSIS — M9903 Segmental and somatic dysfunction of lumbar region: Secondary | ICD-10-CM | POA: Diagnosis not present

## 2019-09-26 DIAGNOSIS — M9905 Segmental and somatic dysfunction of pelvic region: Secondary | ICD-10-CM | POA: Diagnosis not present

## 2019-09-26 DIAGNOSIS — M9903 Segmental and somatic dysfunction of lumbar region: Secondary | ICD-10-CM | POA: Diagnosis not present

## 2019-09-26 DIAGNOSIS — M955 Acquired deformity of pelvis: Secondary | ICD-10-CM | POA: Diagnosis not present

## 2019-09-26 DIAGNOSIS — M5416 Radiculopathy, lumbar region: Secondary | ICD-10-CM | POA: Diagnosis not present

## 2019-10-13 DIAGNOSIS — M7661 Achilles tendinitis, right leg: Secondary | ICD-10-CM | POA: Diagnosis not present

## 2019-10-13 DIAGNOSIS — M545 Low back pain: Secondary | ICD-10-CM | POA: Diagnosis not present

## 2019-10-13 DIAGNOSIS — L4050 Arthropathic psoriasis, unspecified: Secondary | ICD-10-CM | POA: Diagnosis not present

## 2019-10-13 DIAGNOSIS — G8929 Other chronic pain: Secondary | ICD-10-CM | POA: Diagnosis not present

## 2019-10-19 DIAGNOSIS — M9905 Segmental and somatic dysfunction of pelvic region: Secondary | ICD-10-CM | POA: Diagnosis not present

## 2019-10-19 DIAGNOSIS — M5416 Radiculopathy, lumbar region: Secondary | ICD-10-CM | POA: Diagnosis not present

## 2019-10-19 DIAGNOSIS — M9903 Segmental and somatic dysfunction of lumbar region: Secondary | ICD-10-CM | POA: Diagnosis not present

## 2019-10-19 DIAGNOSIS — M955 Acquired deformity of pelvis: Secondary | ICD-10-CM | POA: Diagnosis not present

## 2019-11-02 ENCOUNTER — Other Ambulatory Visit: Payer: Self-pay

## 2019-11-02 DIAGNOSIS — F411 Generalized anxiety disorder: Secondary | ICD-10-CM

## 2019-11-02 MED ORDER — FLUOXETINE HCL 20 MG PO CAPS: 20.0000 mg | ORAL_CAPSULE | Freq: Every day | ORAL | 3 refills | Status: DC

## 2019-11-16 ENCOUNTER — Other Ambulatory Visit: Payer: Self-pay

## 2019-11-16 DIAGNOSIS — E559 Vitamin D deficiency, unspecified: Secondary | ICD-10-CM

## 2019-11-16 MED ORDER — ERGOCALCIFEROL 1.25 MG (50000 UT) PO CAPS: 50000.0000 [IU] | ORAL_CAPSULE | ORAL | 5 refills | Status: DC

## 2019-11-18 ENCOUNTER — Telehealth: Payer: Self-pay

## 2019-11-18 NOTE — Telephone Encounter (Signed)
Lmom to confirm and screen for 11-20-19 ov.

## 2019-11-20 ENCOUNTER — Encounter: Payer: Self-pay | Admitting: Nurse Practitioner

## 2019-11-20 ENCOUNTER — Ambulatory Visit: Payer: 59 | Admitting: Nurse Practitioner

## 2019-11-20 VITALS — BP 132/81 | HR 86 | Temp 97.3°F | Resp 16 | Ht 60.0 in | Wt 171.4 lb

## 2019-11-20 DIAGNOSIS — F411 Generalized anxiety disorder: Secondary | ICD-10-CM

## 2019-11-20 DIAGNOSIS — M1990 Unspecified osteoarthritis, unspecified site: Secondary | ICD-10-CM

## 2019-11-20 DIAGNOSIS — R69 Illness, unspecified: Secondary | ICD-10-CM | POA: Diagnosis not present

## 2019-11-20 DIAGNOSIS — K219 Gastro-esophageal reflux disease without esophagitis: Secondary | ICD-10-CM

## 2019-11-20 NOTE — Progress Notes (Signed)
[facility] [place], [place] [address]  Internal MEDICINE  Office Visit Note  Patient Name: Kristine Stephens  [id number]  [id number]  Date of Service: 11/25/2019  Chief Complaint  Patient presents with  . Anxiety  . Depression  . Follow-up    The patient is here for routine follow up visit. Has had intermittent right ear pain. She states that it has been hurting off and on for a few days. She is having some increased joint and muscle pain because took sometime to get prior authorization for her Carney Bern which she generally takes weekly. Has been without it for two weeks now. Depression and anxiety is well managed. She states that she believes she has problems with ADD. She states that she has trouble focusing on one subject or task at a time and also has trouble with concentration. She does take phentermine to help with weight management. She takes this only from time to time. She does not take phentermine to help with concentration.       Current Medication: Outpatient Encounter Medications as of 11/20/2019  Medication Sig  . b complex vitamins capsule Take 1 capsule by mouth daily.  . clonazePAM (KLONOPIN) 0.5 MG tablet Take 1 tablet (0.5 mg total) by mouth 2 (two) times daily as needed for anxiety.  . conjugated estrogens (PREMARIN) vaginal cream Place 0.5 Applicatorfuls vaginally 2 (two) times a week.  . ergocalciferol (DRISDOL) 1.25 MG (50000 UT) capsule Take 1 capsule (50,000 Units total) by mouth once a week.  . etanercept (ENBREL) 50 MG/ML injection Inject 50 mg into the skin once a week.  . etodolac (LODINE) 400 MG tablet TK 1 T PO BID  . FLUoxetine (PROZAC) 20 MG capsule Take 1 capsule (20 mg total) by mouth daily.  . fluticasone (FLONASE) 50 MCG/ACT nasal spray Place 2 sprays into both nostrils daily.  Marland Kitchen omeprazole (PRILOSEC) 20 MG capsule Take 20 mg by mouth daily.   . phentermine (ADIPEX-P) 37.5 MG tablet Take 1 tablet (37.5 mg total) by mouth daily  before breakfast.   No facility-administered encounter medications on file as of 11/20/2019.    Surgical History: Past Surgical History:  Procedure Laterality Date  . ASD REPAIR  1985  . CARPAL TUNNEL RELEASE    . CESAREAN SECTION    . COLONOSCOPY WITH PROPOFOL N/A 04/11/2017   Procedure: COLONOSCOPY WITH PROPOFOL;  Surgeon: Jonathon Bellows, MD;  Location: [facility] ENDOSCOPY;  Service: Gastroenterology;  Laterality: N/A;  . COLPOSCOPY  2012  . ENDOMETRIAL ABLATION     Failed ablation x2 (Tech difficulties)  . LAPAROSCOPIC VAGINAL HYSTERECTOMY WITH SALPINGO OOPHORECTOMY  2012   Adenomyosis for AUB  . TONSILLECTOMY    . TUBAL LIGATION      Medical History: Past Medical History:  Diagnosis Date  . Anxiety   . Depression   . GERD (gastroesophageal reflux disease)   . Psoriasis     Family History: Family History  Problem Relation Age of Onset  . Breast cancer Paternal Grandmother 5  . Multiple myeloma Father   . Thyroid disease Paternal Aunt     Social History   Socioeconomic History  . Marital status: Married    Spouse name: Not on file  . Number of children: Not on file  . Years of education: Not on file  . Highest education level: Not on file  Occupational History  . Not on file  Tobacco Use  . Smoking status: Never Smoker  . Smokeless tobacco: Never  Used  Substance and Sexual Activity  . Alcohol use: No  . Drug use: No  . Sexual activity: Yes    Partners: Male    Birth control/protection: None  Other Topics Concern  . Not on file  Social History Narrative  . Not on file   Social Determinants of Health   Financial Resource Strain:   . Difficulty of Paying Living Expenses:   Food Insecurity:   . Worried About Charity fundraiser in the Last Year:   . Arboriculturist in the Last Year:   Transportation Needs:   . Film/video editor (Medical):   Marland Kitchen Lack of Transportation (Non-Medical):   Physical Activity:   . Days of Exercise per Week:   . Minutes of  Exercise per Session:   Stress:   . Feeling of Stress :   Social Connections:   . Frequency of Communication with Friends and Family:   . Frequency of Social Gatherings with Friends and Family:   . Attends Religious Services:   . Active Member of Clubs or Organizations:   . Attends Archivist Meetings:   Marland Kitchen Marital Status:   Intimate Partner Violence:   . Fear of Current or Ex-Partner:   . Emotionally Abused:   Marland Kitchen Physically Abused:   . Sexually Abused:       Review of Systems  Constitutional: Negative for chills, fatigue and unexpected weight change.  HENT: Negative for congestion, postnasal drip, rhinorrhea, sneezing and sore throat.   Respiratory: Negative for cough, chest tightness and shortness of breath.   Cardiovascular: Negative for chest pain and palpitations.  Gastrointestinal: Negative for abdominal pain, constipation, diarrhea, nausea and vomiting.  Endocrine: Negative for cold intolerance, heat intolerance, polydipsia and polyuria.  Musculoskeletal: Positive for arthralgias and myalgias. Negative for back pain, joint swelling and neck pain.  Skin: Negative for rash.  Allergic/Immunologic: Negative for environmental allergies.  Neurological: Negative for dizziness, tremors, numbness and headaches.  Hematological: Negative for adenopathy. Does not bruise/bleed easily.  Psychiatric/Behavioral: Positive for decreased concentration and dysphoric mood. Negative for behavioral problems (Depression), sleep disturbance and suicidal ideas. The patient is nervous/anxious.        Well managed on current medication.    Today's Vitals   11/20/19 1409  BP: 132/81  Pulse: 86  Resp: 16  Temp: (!) 97.3 F (36.3 C)  SpO2: 95%  Weight: 171 lb 6.4 oz (77.7 kg)  Height: 5' (1.524 m)   Body mass index is 33.47 kg/m.  Physical Exam Vitals and nursing note reviewed.  Constitutional:      General: She is not in acute distress.    Appearance: Normal appearance. She is  well-developed. She is obese. She is not diaphoretic.  HENT:     Head: Normocephalic and atraumatic.     Nose: Nose normal.     Mouth/Throat:     Pharynx: No oropharyngeal exudate.  Eyes:     Extraocular Movements: Extraocular movements intact.     Pupils: Pupils are equal, round, and reactive to light.  Neck:     Thyroid: No thyromegaly.     Vascular: No JVD.     Trachea: No tracheal deviation.  Cardiovascular:     Rate and Rhythm: Normal rate and regular rhythm.     Heart sounds: Normal heart sounds. No murmur. No friction rub. No gallop.   Pulmonary:     Effort: Pulmonary effort is normal. No respiratory distress.     Breath sounds: Normal breath  sounds. No wheezing or rales.  Chest:     Chest wall: No tenderness.  Abdominal:     Palpations: Abdomen is soft.  Musculoskeletal:        General: Normal range of motion.     Cervical back: Normal range of motion and neck supple.  Lymphadenopathy:     Cervical: No cervical adenopathy.  Skin:    General: Skin is warm and dry.  Neurological:     Mental Status: She is alert and oriented to person, place, and time.     Cranial Nerves: No cranial nerve deficit.  Psychiatric:        Attention and Perception: Attention and perception normal.        Mood and Affect: Affect normal. Mood is anxious and depressed.        Speech: Speech normal.        Behavior: Behavior normal.        Thought Content: Thought content normal.        Cognition and Memory: Cognition and memory normal.        Judgment: Judgment normal.   Assessment/Plan: 1. Gastroesophageal reflux disease without esophagitis May conitnue to take omeprazole as previously prescribed   2. Inflammatory osteoarthritis Continue regular visits with rheumatology as scheduled.   3. GAD (generalized anxiety disorder) Overall, well managed. Continue prozac daily. Use clonazepam as needed and as prescribed. Will evaluate patient for adult ADD at next visit and treat as indicated.    General Counseling: davin muramoto understanding of the findings of todays visit and agrees with plan of treatment. I have discussed any further diagnostic evaluation that may be needed or ordered today. We also reviewed her medications today. she has been encouraged to call the office with any questions or concerns that should arise related to todays visit.  This patient was seen by Leretha Pol FNP Collaboration with Dr Lavera Guise as a part of collaborative care agreement   Total time spent: 25 Minutes   Time spent includes review of chart, medications, test results, and follow up plan with the patient.      Dr Lavera Guise Internal medicine

## 2019-11-24 ENCOUNTER — Telehealth: Payer: Self-pay

## 2019-11-24 NOTE — Telephone Encounter (Signed)
Spoke w/patient. Advised we do have open apts for urinalysis. However, since she isn't having any urinary symptoms, may want to start w/OTC Urinalysis kit (more cost effective). Patient will do this and contact us if positive to set apt for Urinalysis & Culture/set up apt for eval if neg.

## 2019-11-24 NOTE — Telephone Encounter (Signed)
Patient reports she has been having some lower pelvic pain. No urinary symptoms. Inquiring if she can make an apt to have her urine checked. [id number]

## 2019-12-11 ENCOUNTER — Telehealth: Payer: Self-pay

## 2019-12-11 NOTE — Telephone Encounter (Signed)
Confirmed and screened for 12-15-19 ov. 

## 2019-12-15 ENCOUNTER — Ambulatory Visit: Payer: 59 | Admitting: Nurse Practitioner

## 2019-12-15 ENCOUNTER — Other Ambulatory Visit: Payer: Self-pay

## 2019-12-15 ENCOUNTER — Encounter: Payer: Self-pay | Admitting: Nurse Practitioner

## 2019-12-15 VITALS — BP 116/72 | HR 82 | Temp 97.6°F | Resp 16 | Ht 60.0 in | Wt 171.0 lb

## 2019-12-15 DIAGNOSIS — F988 Other specified behavioral and emotional disorders with onset usually occurring in childhood and adolescence: Secondary | ICD-10-CM | POA: Diagnosis not present

## 2019-12-15 DIAGNOSIS — R69 Illness, unspecified: Secondary | ICD-10-CM | POA: Diagnosis not present

## 2019-12-15 DIAGNOSIS — K219 Gastro-esophageal reflux disease without esophagitis: Secondary | ICD-10-CM

## 2019-12-15 DIAGNOSIS — M1990 Unspecified osteoarthritis, unspecified site: Secondary | ICD-10-CM | POA: Diagnosis not present

## 2019-12-15 MED ORDER — OMEPRAZOLE 20 MG PO CPDR: 20.0000 mg | DELAYED_RELEASE_CAPSULE | Freq: Every day | ORAL | 3 refills | Status: DC

## 2019-12-15 MED ORDER — AMPHETAMINE-DEXTROAMPHETAMINE 10 MG PO TABS: ORAL_TABLET | ORAL | 0 refills | Status: DC

## 2019-12-15 NOTE — Progress Notes (Signed)
[facility] [place], [place] [address]  Internal MEDICINE  Office Visit Note  Patient Name: Kristine Stephens  [id number]  [id number]  Date of Service: 12/27/2019  Chief Complaint  Patient presents with  . Follow-up  . Depression  . Gastroesophageal Reflux  . ADD    would like to be assessed for ADD    The patient is here for follow up visit. She states that she believes she has problems with ADD. She states that she has trouble focusing on one subject or task at a time and also has trouble with concentration. She does take phentermine to help with weight management. This medication does not help her with problems with concentration or focus.       Current Medication: Outpatient Encounter Medications as of 12/15/2019  Medication Sig  . b complex vitamins capsule Take 1 capsule by mouth daily.  . clonazePAM (KLONOPIN) 0.5 MG tablet Take 1 tablet (0.5 mg total) by mouth 2 (two) times daily as needed for anxiety.  . conjugated estrogens (PREMARIN) vaginal cream Place 0.5 Applicatorfuls vaginally 2 (two) times a week.  . ergocalciferol (DRISDOL) 1.25 MG (50000 UT) capsule Take 1 capsule (50,000 Units total) by mouth once a week.  . etanercept (ENBREL) 50 MG/ML injection Inject 50 mg into the skin once a week.  . etodolac (LODINE) 400 MG tablet TK 1 T PO BID  . FLUoxetine (PROZAC) 20 MG capsule Take 1 capsule (20 mg total) by mouth daily.  . fluticasone (FLONASE) 50 MCG/ACT nasal spray Place 2 sprays into both nostrils daily.  Marland Kitchen omeprazole (PRILOSEC) 20 MG capsule Take 1 capsule (20 mg total) by mouth daily.  . [DISCONTINUED] omeprazole (PRILOSEC) 20 MG capsule Take 20 mg by mouth daily.   . [DISCONTINUED] phentermine (ADIPEX-P) 37.5 MG tablet Take 1 tablet (37.5 mg total) by mouth daily before breakfast.  . amphetamine-dextroamphetamine (ADDERALL) 10 MG tablet Take 1/2 to 1 tablet po BiD prn   No facility-administered encounter medications on file as of  12/15/2019.    Surgical History: Past Surgical History:  Procedure Laterality Date  . ASD REPAIR  1985  . CARPAL TUNNEL RELEASE    . CESAREAN SECTION    . COLONOSCOPY WITH PROPOFOL N/A 04/11/2017   Procedure: COLONOSCOPY WITH PROPOFOL;  Surgeon: Jonathon Bellows, MD;  Location: [facility] ENDOSCOPY;  Service: Gastroenterology;  Laterality: N/A;  . COLPOSCOPY  2012  . ENDOMETRIAL ABLATION     Failed ablation x2 (Tech difficulties)  . LAPAROSCOPIC VAGINAL HYSTERECTOMY WITH SALPINGO OOPHORECTOMY  2012   Adenomyosis for AUB  . TONSILLECTOMY    . TUBAL LIGATION      Medical History: Past Medical History:  Diagnosis Date  . Anxiety   . Depression   . GERD (gastroesophageal reflux disease)   . Psoriasis     Family History: Family History  Problem Relation Age of Onset  . Breast cancer Paternal Grandmother 24  . Multiple myeloma Father   . Thyroid disease Paternal Aunt     Social History   Socioeconomic History  . Marital status: Married    Spouse name: Not on file  . Number of children: Not on file  . Years of education: Not on file  . Highest education level: Not on file  Occupational History  . Not on file  Tobacco Use  . Smoking status: Never Smoker  . Smokeless tobacco: Never Used  Vaping Use  . Vaping Use: Never used  Substance and Sexual Activity  .  Alcohol use: No  . Drug use: No  . Sexual activity: Yes    Partners: Male    Birth control/protection: None  Other Topics Concern  . Not on file  Social History Narrative  . Not on file   Social Determinants of Health   Financial Resource Strain:   . Difficulty of Paying Living Expenses:   Food Insecurity:   . Worried About Charity fundraiser in the Last Year:   . Arboriculturist in the Last Year:   Transportation Needs:   . Film/video editor (Medical):   Marland Kitchen Lack of Transportation (Non-Medical):   Physical Activity:   . Days of Exercise per Week:   . Minutes of Exercise per Session:   Stress:   . Feeling  of Stress :   Social Connections:   . Frequency of Communication with Friends and Family:   . Frequency of Social Gatherings with Friends and Family:   . Attends Religious Services:   . Active Member of Clubs or Organizations:   . Attends Archivist Meetings:   Marland Kitchen Marital Status:   Intimate Partner Violence:   . Fear of Current or Ex-Partner:   . Emotionally Abused:   Marland Kitchen Physically Abused:   . Sexually Abused:       Review of Systems  Constitutional: Positive for fatigue. Negative for activity change, chills and unexpected weight change.  HENT: Negative for congestion, postnasal drip, rhinorrhea, sneezing and sore throat.   Respiratory: Negative for cough, chest tightness and shortness of breath.   Cardiovascular: Negative for chest pain and palpitations.  Gastrointestinal: Negative for abdominal pain, constipation, diarrhea, nausea and vomiting.  Endocrine: Negative for cold intolerance, heat intolerance, polydipsia and polyuria.  Musculoskeletal: Positive for arthralgias and myalgias. Negative for back pain, joint swelling and neck pain.  Skin: Negative for rash.  Allergic/Immunologic: Negative for environmental allergies.  Neurological: Negative for dizziness, tremors, numbness and headaches.  Hematological: Negative for adenopathy. Does not bruise/bleed easily.  Psychiatric/Behavioral: Positive for decreased concentration and dysphoric mood. Negative for behavioral problems (Depression), sleep disturbance and suicidal ideas. The patient is nervous/anxious.     Today's Vitals   12/15/19 1544  BP: 116/72  Pulse: 82  Resp: 16  Temp: 97.6 F (36.4 C)  SpO2: 96%  Weight: 171 lb (77.6 kg)  Height: 5' (1.524 m)   Body mass index is 33.4 kg/m.  Physical Exam Vitals and nursing note reviewed.  Constitutional:      General: She is not in acute distress.    Appearance: Normal appearance. She is well-developed. She is obese. She is not diaphoretic.  HENT:     Head:  Normocephalic and atraumatic.     Nose: Nose normal.     Mouth/Throat:     Pharynx: No oropharyngeal exudate.  Eyes:     Extraocular Movements: Extraocular movements intact.     Pupils: Pupils are equal, round, and reactive to light.  Neck:     Thyroid: No thyromegaly.     Vascular: No JVD.     Trachea: No tracheal deviation.  Cardiovascular:     Rate and Rhythm: Normal rate and regular rhythm.     Heart sounds: Normal heart sounds. No murmur heard.  No friction rub. No gallop.   Pulmonary:     Effort: Pulmonary effort is normal. No respiratory distress.     Breath sounds: Normal breath sounds. No wheezing or rales.  Chest:     Chest wall: No tenderness.  Abdominal:     Palpations: Abdomen is soft.  Musculoskeletal:        General: Normal range of motion.     Cervical back: Normal range of motion and neck supple.  Lymphadenopathy:     Cervical: No cervical adenopathy.  Skin:    General: Skin is warm and dry.  Neurological:     Mental Status: She is alert and oriented to person, place, and time.     Cranial Nerves: No cranial nerve deficit.  Psychiatric:        Attention and Perception: Attention and perception normal.        Mood and Affect: Affect normal. Mood is anxious and depressed.        Speech: Speech normal.        Behavior: Behavior normal.        Thought Content: Thought content normal.        Cognition and Memory: Cognition and memory normal.        Judgment: Judgment normal.     Comments: The patient was administered a self-assessment for ADD today. She scored very high on several aspects of the examinations, suggesting that she does have adult onset ADD.     Assessment/Plan: 1. Gastroesophageal reflux disease without esophagitis May continue omeprazole 43m daily.  - omeprazole (PRILOSEC) 20 MG capsule; Take 1 capsule (20 mg total) by mouth daily.  Dispense: 90 capsule; Refill: 3  2. Attention deficit disorder (ADD) in adult Patient scored very high on  some aspects of self-assessment for adult onset ADD. Will do trial of adderall 159mup to twice daily as needed to help with focus and concentration. Will see her back in 30 days for reassessment.  - amphetamine-dextroamphetamine (ADDERALL) 10 MG tablet; Take 1/2 to 1 tablet po BiD prn  Dispense: 60 tablet; Refill: 0  3. Inflammatory osteoarthritis Continue regular visits with rheumatology as scheduled.   General Counseling: Wedaneen volcynderstanding of the findings of todays visit and agrees with plan of treatment. I have discussed any further diagnostic evaluation that may be needed or ordered today. We also reviewed her medications today. she has been encouraged to call the office with any questions or concerns that should arise related to todays visit.   This patient was seen by [facility] Dr FoLavera Guises a part of collaborative care agreement  Meds ordered this encounter  Medications  . amphetamine-dextroamphetamine (ADDERALL) 10 MG tablet    Sig: Take 1/2 to 1 tablet po BiD prn    Dispense:  60 tablet    Refill:  0    Order Specific Question:   Supervising Provider    Answer:   KHLavera Guise1[place]. omeprazole (PRILOSEC) 20 MG capsule    Sig: Take 1 capsule (20 mg total) by mouth daily.    Dispense:  90 capsule    Refill:  3    Order Specific Question:   Supervising Provider    Answer:   KHLavera Guise1[[id number]]  Total time spent: 30 Minutes   Time spent includes review of chart, medications, test results, and follow up plan with the patient.      Dr FoLavera Guisenternal medicine

## 2019-12-16 ENCOUNTER — Telehealth: Payer: Self-pay

## 2019-12-16 NOTE — Telephone Encounter (Signed)
Pt calling triage stating her premarin needs PA. Have you received anything for this pt?

## 2019-12-16 NOTE — Telephone Encounter (Signed)
Have you seen anything come across the fax machine on this pt. If so, can you fax it here to me? [phone]

## 2019-12-27 DIAGNOSIS — F988 Other specified behavioral and emotional disorders with onset usually occurring in childhood and adolescence: Secondary | ICD-10-CM | POA: Insufficient documentation

## 2020-01-08 ENCOUNTER — Telehealth: Payer: Self-pay

## 2020-01-08 NOTE — Telephone Encounter (Signed)
Confirmed and screened for 01-12-20 ov.

## 2020-01-12 ENCOUNTER — Ambulatory Visit (INDEPENDENT_AMBULATORY_CARE_PROVIDER_SITE_OTHER): Payer: 59 | Admitting: Nurse Practitioner

## 2020-01-12 ENCOUNTER — Encounter: Payer: Self-pay | Admitting: Nurse Practitioner

## 2020-01-12 ENCOUNTER — Other Ambulatory Visit: Payer: Self-pay

## 2020-01-12 VITALS — BP 113/62 | HR 85 | Temp 97.7°F | Resp 16 | Ht 60.0 in | Wt 174.4 lb

## 2020-01-12 DIAGNOSIS — K219 Gastro-esophageal reflux disease without esophagitis: Secondary | ICD-10-CM

## 2020-01-12 DIAGNOSIS — F988 Other specified behavioral and emotional disorders with onset usually occurring in childhood and adolescence: Secondary | ICD-10-CM | POA: Diagnosis not present

## 2020-01-12 DIAGNOSIS — R5383 Other fatigue: Secondary | ICD-10-CM | POA: Diagnosis not present

## 2020-01-12 DIAGNOSIS — R69 Illness, unspecified: Secondary | ICD-10-CM | POA: Diagnosis not present

## 2020-01-12 MED ORDER — AMPHETAMINE-DEXTROAMPHETAMINE 10 MG PO TABS: ORAL_TABLET | ORAL | 0 refills | Status: DC

## 2020-01-12 NOTE — Progress Notes (Signed)
[facility] [place],  [address]  Internal MEDICINE  Office Visit Note  Patient Name: Kristine Stephens  [id number]  [id number]  Date of Service: 01/17/2020  Chief Complaint  Patient presents with  . Follow-up  . Depression  . Gastroesophageal Reflux  . Anxiety    The patient is here for follow up visit. She was started on adderall '10mg'$  twice daily at her last visit due to significant problems with focus and concentration, especially at work. This was beginning to cause her significant problems with her job. She states that the addition of adderall has helped her a great deal. She is able to concentrate on one task at a time, complete tasks, then move on to the next one. She denies negative side effects associated with taking this new medication.       Current Medication: Outpatient Encounter Medications as of 01/12/2020  Medication Sig  . b complex vitamins capsule Take 1 capsule by mouth daily.  . clonazePAM (KLONOPIN) 0.5 MG tablet Take 1 tablet (0.5 mg total) by mouth 2 (two) times daily as needed for anxiety.  . conjugated estrogens (PREMARIN) vaginal cream Place 0.5 Applicatorfuls vaginally 2 (two) times a week.  . ergocalciferol (DRISDOL) 1.25 MG (50000 UT) capsule Take 1 capsule (50,000 Units total) by mouth once a week.  . etanercept (ENBREL) 50 MG/ML injection Inject 50 mg into the skin once a week.  . etodolac (LODINE) 400 MG tablet TK 1 T PO BID  . FLUoxetine (PROZAC) 20 MG capsule Take 1 capsule (20 mg total) by mouth daily.  . fluticasone (FLONASE) 50 MCG/ACT nasal spray Place 2 sprays into both nostrils daily.  Marland Kitchen omeprazole (PRILOSEC) 20 MG capsule Take 1 capsule (20 mg total) by mouth daily.  . [DISCONTINUED] amphetamine-dextroamphetamine (ADDERALL) 10 MG tablet Take 1/2 to 1 tablet po BiD prn  . [DISCONTINUED] amphetamine-dextroamphetamine (ADDERALL) 10 MG tablet Take 1/2 to 1 tablet po BiD prn  . [DISCONTINUED]  amphetamine-dextroamphetamine (ADDERALL) 10 MG tablet Take 1/2 to 1 tablet po BiD prn  . amphetamine-dextroamphetamine (ADDERALL) 10 MG tablet Take 1/2 to 1 tablet po BiD prn   No facility-administered encounter medications on file as of 01/12/2020.    Surgical History: Past Surgical History:  Procedure Laterality Date  . ASD REPAIR  1985  . CARPAL TUNNEL RELEASE    . CESAREAN SECTION    . COLONOSCOPY WITH PROPOFOL N/A 04/11/2017   Procedure: COLONOSCOPY WITH PROPOFOL;  Surgeon: Jonathon Bellows, MD;  Location: [facility] ENDOSCOPY;  Service: Gastroenterology;  Laterality: N/A;  . COLPOSCOPY  2012  . ENDOMETRIAL ABLATION     Failed ablation x2 (Tech difficulties)  . LAPAROSCOPIC VAGINAL HYSTERECTOMY WITH SALPINGO OOPHORECTOMY  2012   Adenomyosis for AUB  . TONSILLECTOMY    . TUBAL LIGATION      Medical History: Past Medical History:  Diagnosis Date  . Anxiety   . Depression   . GERD (gastroesophageal reflux disease)   . Psoriasis     Family History: Family History  Problem Relation Age of Onset  . Breast cancer Paternal Grandmother 24  . Multiple myeloma Father   . Thyroid disease Paternal Aunt     Social History   Socioeconomic History  . Marital status: Married    Spouse name: Not on file  . Number of children: Not on file  . Years of education: Not on file  . Highest education level: Not on file  Occupational History  . Not on file  Tobacco Use  . Smoking status: Never Smoker  . Smokeless tobacco: Never Used  Vaping Use  . Vaping Use: Never used  Substance and Sexual Activity  . Alcohol use: No  . Drug use: No  . Sexual activity: Yes    Partners: Male    Birth control/protection: None  Other Topics Concern  . Not on file  Social History Narrative  . Not on file   Social Determinants of Health   Financial Resource Strain:   . Difficulty of Paying Living Expenses:   Food Insecurity:   . Worried About Charity fundraiser in the Last Year:   . Arboriculturist  in the Last Year:   Transportation Needs:   . Film/video editor (Medical):   Marland Kitchen Lack of Transportation (Non-Medical):   Physical Activity:   . Days of Exercise per Week:   . Minutes of Exercise per Session:   Stress:   . Feeling of Stress :   Social Connections:   . Frequency of Communication with Friends and Family:   . Frequency of Social Gatherings with Friends and Family:   . Attends Religious Services:   . Active Member of Clubs or Organizations:   . Attends Archivist Meetings:   Marland Kitchen Marital Status:   Intimate Partner Violence:   . Fear of Current or Ex-Partner:   . Emotionally Abused:   Marland Kitchen Physically Abused:   . Sexually Abused:       Review of Systems  Constitutional: Positive for fatigue. Negative for activity change, chills and unexpected weight change.  HENT: Negative for congestion, postnasal drip, rhinorrhea, sneezing and sore throat.   Respiratory: Negative for cough, chest tightness and shortness of breath.   Cardiovascular: Negative for chest pain and palpitations.  Gastrointestinal: Negative for abdominal pain, constipation, diarrhea, nausea and vomiting.  Endocrine: Negative for cold intolerance, heat intolerance, polydipsia and polyuria.  Musculoskeletal: Positive for arthralgias and myalgias. Negative for back pain, joint swelling and neck pain.  Skin: Negative for rash.  Allergic/Immunologic: Negative for environmental allergies.  Neurological: Negative for dizziness, tremors, numbness and headaches.  Hematological: Negative for adenopathy. Does not bruise/bleed easily.  Psychiatric/Behavioral: Positive for decreased concentration and dysphoric mood. Negative for behavioral problems (Depression), sleep disturbance and suicidal ideas. The patient is nervous/anxious.        Improved focus and concentration since starting on adderall. She is generally only taking one full tablet a day. Half in the morning and another half later on in the day.      Today's Vitals   01/12/20 1537  BP: 113/62  Pulse: 85  Resp: 16  Temp: 97.7 F (36.5 C)  SpO2: 97%  Weight: 174 lb 6.4 oz (79.1 kg)  Height: 5' (1.524 m)   Body mass index is 34.06 kg/m.  Physical Exam Vitals and nursing note reviewed.  Constitutional:      General: She is not in acute distress.    Appearance: Normal appearance. She is well-developed. She is not diaphoretic.  HENT:     Head: Normocephalic and atraumatic.     Nose: Nose normal.     Mouth/Throat:     Pharynx: No oropharyngeal exudate.  Eyes:     Extraocular Movements: Extraocular movements intact.     Pupils: Pupils are equal, round, and reactive to light.  Neck:     Thyroid: No thyromegaly.     Vascular: No JVD.     Trachea: No tracheal deviation.  Cardiovascular:  Rate and Rhythm: Normal rate and regular rhythm.     Heart sounds: Normal heart sounds. No murmur heard.  No friction rub. No gallop.   Pulmonary:     Effort: Pulmonary effort is normal. No respiratory distress.     Breath sounds: Normal breath sounds. No wheezing or rales.  Chest:     Chest wall: No tenderness.  Abdominal:     Palpations: Abdomen is soft.  Musculoskeletal:        General: Normal range of motion.     Cervical back: Normal range of motion and neck supple.  Lymphadenopathy:     Cervical: No cervical adenopathy.  Skin:    General: Skin is warm and dry.  Neurological:     Mental Status: She is alert and oriented to person, place, and time.     Cranial Nerves: No cranial nerve deficit.  Psychiatric:        Attention and Perception: Attention and perception normal.        Mood and Affect: Affect normal. Mood is anxious and depressed.        Speech: Speech normal.        Behavior: Behavior normal.        Thought Content: Thought content normal.        Cognition and Memory: Cognition and memory normal.        Judgment: Judgment normal.     Comments: Improved mood, focus, and concentration since starting on  adderall.     Assessment/Plan: 1. Gastroesophageal reflux disease without esophagitis Continue omeprazole as prescribed   2. Other fatigue Improved since addition of adderall.   3. Attention deficit disorder (ADD) in adult Improved with addition of adderall. May continue '10mg'$  up to twice daily when needed for focus and concentration. Three 30 day prescriptions provided today. Dates are 01/12/2020, 02/10/2020, and 03/10/2020.  - amphetamine-dextroamphetamine (ADDERALL) 10 MG tablet; Take 1/2 to 1 tablet po BiD prn  Dispense: 60 tablet; Refill: 0  General Counseling: riata ikeda understanding of the findings of todays visit and agrees with plan of treatment. I have discussed any further diagnostic evaluation that may be needed or ordered today. We also reviewed her medications today. she has been encouraged to call the office with any questions or concerns that should arise related to todays visit.   Refilled Controlled medications today. Reviewed risks and possible side effects associated with taking Stimulants. Combination of these drugs with other psychotropic medications could cause dizziness and drowsiness. Pt needs to Monitor symptoms and exercise caution in driving and operating heavy machinery to avoid damages to oneself, to others and to the surroundings. Patient verbalized understanding in this matter. Dependence and abuse for these drugs will be monitored closely. A Controlled substance policy and procedure is on file which allows [facility] to order a urine drug screen test at any visit. Patient understands and agrees with the plan..  This patient was seen by Leretha Pol FNP Collaboration with Dr Lavera Guise as a part of collaborative care agreement  Meds ordered this encounter  Medications  . DISCONTD: amphetamine-dextroamphetamine (ADDERALL) 10 MG tablet    Sig: Take 1/2 to 1 tablet po BiD prn    Dispense:  60 tablet    Refill:  0    Order Specific Question:    Supervising Provider    Answer:   Lavera Guise [place]  . DISCONTD: amphetamine-dextroamphetamine (ADDERALL) 10 MG tablet    Sig: Take 1/2 to 1 tablet po BiD  prn    Dispense:  60 tablet    Refill:  0    Fill after 02/10/2020    Order Specific Question:   Supervising Provider    Answer:   Lavera Guise [place]  . amphetamine-dextroamphetamine (ADDERALL) 10 MG tablet    Sig: Take 1/2 to 1 tablet po BiD prn    Dispense:  60 tablet    Refill:  0    Fill after 03/10/2020    Order Specific Question:   Supervising Provider    Answer:   Lavera Guise [[id number]]    Total time spent: 25 Minutes   Time spent includes review of chart, medications, test results, and follow up plan with the patient.      Dr Lavera Guise Internal medicine

## 2020-01-17 DIAGNOSIS — R5383 Other fatigue: Secondary | ICD-10-CM | POA: Insufficient documentation

## 2020-02-05 ENCOUNTER — Other Ambulatory Visit: Payer: Self-pay | Admitting: Adult Health

## 2020-02-05 DIAGNOSIS — F411 Generalized anxiety disorder: Secondary | ICD-10-CM

## 2020-02-05 NOTE — Telephone Encounter (Signed)
LOV: 01/12/20 NOV: 04/08/20 Last RF: 08/06/19 45 tabs w/3 rf Clonazepam 0.5 mg refill requested from pharmacy.  dbs

## 2020-02-08 ENCOUNTER — Other Ambulatory Visit: Payer: Self-pay | Admitting: Obstetrics and Gynecology

## 2020-02-08 MED ORDER — ESTRADIOL 0.1 MG/GM VA CREA: 1.0000 | TOPICAL_CREAM | VAGINAL | 3 refills | Status: DC

## 2020-02-24 ENCOUNTER — Other Ambulatory Visit: Payer: Self-pay

## 2020-02-24 DIAGNOSIS — F411 Generalized anxiety disorder: Secondary | ICD-10-CM

## 2020-02-24 MED ORDER — FLUOXETINE HCL 20 MG PO CAPS: 20.0000 mg | ORAL_CAPSULE | Freq: Every day | ORAL | 3 refills | Status: DC

## 2020-02-26 DIAGNOSIS — L400 Psoriasis vulgaris: Secondary | ICD-10-CM | POA: Diagnosis not present

## 2020-02-26 DIAGNOSIS — L720 Epidermal cyst: Secondary | ICD-10-CM | POA: Diagnosis not present

## 2020-02-26 DIAGNOSIS — D2262 Melanocytic nevi of left upper limb, including shoulder: Secondary | ICD-10-CM | POA: Diagnosis not present

## 2020-02-26 DIAGNOSIS — L821 Other seborrheic keratosis: Secondary | ICD-10-CM | POA: Diagnosis not present

## 2020-02-26 DIAGNOSIS — L918 Other hypertrophic disorders of the skin: Secondary | ICD-10-CM | POA: Diagnosis not present

## 2020-02-26 DIAGNOSIS — D225 Melanocytic nevi of trunk: Secondary | ICD-10-CM | POA: Diagnosis not present

## 2020-02-26 DIAGNOSIS — D2261 Melanocytic nevi of right upper limb, including shoulder: Secondary | ICD-10-CM | POA: Diagnosis not present

## 2020-02-26 DIAGNOSIS — L718 Other rosacea: Secondary | ICD-10-CM | POA: Diagnosis not present

## 2020-02-26 DIAGNOSIS — D2272 Melanocytic nevi of left lower limb, including hip: Secondary | ICD-10-CM | POA: Diagnosis not present

## 2020-02-26 DIAGNOSIS — D2271 Melanocytic nevi of right lower limb, including hip: Secondary | ICD-10-CM | POA: Diagnosis not present

## 2020-04-08 ENCOUNTER — Ambulatory Visit: Payer: 59 | Admitting: Nurse Practitioner

## 2020-04-08 ENCOUNTER — Ambulatory Visit: Payer: 59 | Admitting: Obstetrics and Gynecology

## 2020-04-14 ENCOUNTER — Encounter: Payer: Self-pay | Admitting: Hospice and Palliative Medicine

## 2020-04-14 ENCOUNTER — Ambulatory Visit: Payer: 59 | Admitting: Hospice and Palliative Medicine

## 2020-04-14 ENCOUNTER — Other Ambulatory Visit: Payer: Self-pay

## 2020-04-14 DIAGNOSIS — G479 Sleep disorder, unspecified: Secondary | ICD-10-CM

## 2020-04-14 DIAGNOSIS — Z79899 Other long term (current) drug therapy: Secondary | ICD-10-CM

## 2020-04-14 DIAGNOSIS — Z23 Encounter for immunization: Secondary | ICD-10-CM | POA: Diagnosis not present

## 2020-04-14 DIAGNOSIS — R69 Illness, unspecified: Secondary | ICD-10-CM | POA: Diagnosis not present

## 2020-04-14 DIAGNOSIS — F988 Other specified behavioral and emotional disorders with onset usually occurring in childhood and adolescence: Secondary | ICD-10-CM

## 2020-04-14 LAB — POCT URINE DRUG SCREEN
POC Amphetamine UR: NOT DETECTED
POC BENZODIAZEPINES UR: NOT DETECTED
POC Barbiturate UR: NOT DETECTED
POC Cocaine UR: NOT DETECTED
POC Ecstasy UR: NOT DETECTED
POC Marijuana UR: NOT DETECTED
POC Methadone UR: NOT DETECTED
POC Methamphetamine UR: NOT DETECTED
POC Opiate Ur: NOT DETECTED
POC Oxycodone UR: NOT DETECTED
POC PHENCYCLIDINE UR: NOT DETECTED
POC TRICYCLICS UR: NOT DETECTED

## 2020-04-14 MED ORDER — AMPHETAMINE-DEXTROAMPHETAMINE 10 MG PO TABS: 10.0000 mg | ORAL_TABLET | Freq: Every day | ORAL | 0 refills | Status: DC

## 2020-04-14 MED ORDER — MIRTAZAPINE 7.5 MG PO TABS: 7.5000 mg | ORAL_TABLET | Freq: Every day | ORAL | 0 refills | Status: DC

## 2020-04-14 NOTE — Progress Notes (Signed)
[facility] [place], [place] [address]  Internal MEDICINE  Office Visit Note  Patient Name: Kristine Stephens  [id number]  [id number]  Date of Service: 04/19/2020  Chief Complaint  Patient presents with  . Follow-up    refill request  . Depression  . Quality Metric Gaps    TDAP    HPI Patient is here for routine follow-up We discussed [facility] updated controlled substance policy as she is requesting refills on her Adderall She was recently started on Adderall to help her focus at work and concentrate on one task at a time--she only takes her Adderall during the work week, never on the [employer] only takes her Adderall once a day and most days she only requires 1/2 a tablet She does still struggle with her sleep--has been on Klonopin for some time to help with her sleep, sleep has been an issue with her for some time She does not like that she has to rely upon Klonopin to help her sleep, she does not remember completely but feels as though the only other medication she has tried was Ambien which caused her night terrors She is open to trying different medications--she fears she will become dependent upon Klonopin Has never been tested for sleep apnea--has been told by her husband that she snores  Current Medication: Outpatient Encounter Medications as of 04/14/2020  Medication Sig  . b complex vitamins capsule Take 1 capsule by mouth daily.  . clonazePAM (KLONOPIN) 0.5 MG tablet TAKE 1 TABLET(0.5 MG) BY MOUTH TWICE DAILY AS NEEDED FOR ANXIETY  . ergocalciferol (DRISDOL) 1.25 MG (50000 UT) capsule Take 1 capsule (50,000 Units total) by mouth once a week.  . estradiol (ESTRACE VAGINAL) 0.1 MG/GM vaginal cream Place 1 Applicatorful vaginally 3 (three) times a week.  . etanercept (ENBREL) 50 MG/ML injection Inject 50 mg into the skin once a week.  . etodolac (LODINE) 400 MG tablet TK 1 T PO BID  . FLUoxetine (PROZAC) 20 MG capsule Take 1 capsule (20 mg  total) by mouth daily.  . fluticasone (FLONASE) 50 MCG/ACT nasal spray Place 2 sprays into both nostrils daily.  Marland Kitchen omeprazole (PRILOSEC) 20 MG capsule Take 1 capsule (20 mg total) by mouth daily.  . [DISCONTINUED] amphetamine-dextroamphetamine (ADDERALL) 10 MG tablet Take 1/2 to 1 tablet po BiD prn  . amphetamine-dextroamphetamine (ADDERALL) 10 MG tablet Take 1 tablet (10 mg total) by mouth daily with breakfast.  . mirtazapine (REMERON) 7.5 MG tablet Take 1 tablet (7.5 mg total) by mouth at bedtime.   No facility-administered encounter medications on file as of 04/14/2020.    Surgical History: Past Surgical History:  Procedure Laterality Date  . ASD REPAIR  1985  . CARPAL TUNNEL RELEASE    . CESAREAN SECTION    . COLONOSCOPY WITH PROPOFOL N/A 04/11/2017   Procedure: COLONOSCOPY WITH PROPOFOL;  Surgeon: Jonathon Bellows, MD;  Location: [facility] ENDOSCOPY;  Service: Gastroenterology;  Laterality: N/A;  . COLPOSCOPY  2012  . ENDOMETRIAL ABLATION     Failed ablation x2 (Tech difficulties)  . LAPAROSCOPIC VAGINAL HYSTERECTOMY WITH SALPINGO OOPHORECTOMY  2012   Adenomyosis for AUB  . TONSILLECTOMY    . TUBAL LIGATION      Medical History: Past Medical History:  Diagnosis Date  . Anxiety   . Depression   . GERD (gastroesophageal reflux disease)   . Psoriasis     Family History: Family History  Problem Relation Age of Onset  . Breast cancer Paternal Grandmother 50  .  Multiple myeloma Father   . Thyroid disease Paternal Aunt     Social History   Socioeconomic History  . Marital status: Married    Spouse name: Not on file  . Number of children: Not on file  . Years of education: Not on file  . Highest education level: Not on file  Occupational History  . Not on file  Tobacco Use  . Smoking status: Never Smoker  . Smokeless tobacco: Never Used  Vaping Use  . Vaping Use: Never used  Substance and Sexual Activity  . Alcohol use: No  . Drug use: No  . Sexual activity: Yes     Partners: Male    Birth control/protection: None  Other Topics Concern  . Not on file  Social History Narrative  . Not on file   Social Determinants of Health   Financial Resource Strain:   . Difficulty of Paying Living Expenses: Not on file  Food Insecurity:   . Worried About Charity fundraiser in the Last Year: Not on file  . Ran Out of Food in the Last Year: Not on file  Transportation Needs:   . Lack of Transportation (Medical): Not on file  . Lack of Transportation (Non-Medical): Not on file  Physical Activity:   . Days of Exercise per Week: Not on file  . Minutes of Exercise per Session: Not on file  Stress:   . Feeling of Stress : Not on file  Social Connections:   . Frequency of Communication with Friends and Family: Not on file  . Frequency of Social Gatherings with Friends and Family: Not on file  . Attends Religious Services: Not on file  . Active Member of Clubs or Organizations: Not on file  . Attends Archivist Meetings: Not on file  . Marital Status: Not on file  Intimate Partner Violence:   . Fear of Current or Ex-Partner: Not on file  . Emotionally Abused: Not on file  . Physically Abused: Not on file  . Sexually Abused: Not on file   Review of Systems  Constitutional: Negative for chills, diaphoresis and fatigue.  HENT: Negative for ear pain, postnasal drip and sinus pressure.   Eyes: Negative for photophobia, discharge, redness, itching and visual disturbance.  Respiratory: Negative for cough, shortness of breath and wheezing.   Cardiovascular: Negative for chest pain, palpitations and leg swelling.  Gastrointestinal: Negative for abdominal pain, constipation, diarrhea, nausea and vomiting.  Genitourinary: Negative for dysuria and flank pain.  Musculoskeletal: Negative for arthralgias, back pain, gait problem and neck pain.  Skin: Negative for color change.  Allergic/Immunologic: Negative for environmental allergies and food allergies.   Neurological: Negative for dizziness and headaches.  Hematological: Does not bruise/bleed easily.  Psychiatric/Behavioral: Positive for sleep disturbance. Negative for agitation, behavioral problems (depression) and hallucinations.    Vital Signs: BP 110/72   Pulse 80   Temp 97.6 F (36.4 C)   Ht 5' (1.524 m)   Wt 172 lb 6.4 oz (78.2 kg)   SpO2 97%   BMI 33.67 kg/m    Physical Exam Vitals reviewed.  Constitutional:      Appearance: Normal appearance. She is obese.  Cardiovascular:     Rate and Rhythm: Normal rate and regular rhythm.     Pulses: Normal pulses.     Heart sounds: Normal heart sounds.  Pulmonary:     Effort: Pulmonary effort is normal.     Breath sounds: Normal breath sounds.  Musculoskeletal:  General: Normal range of motion.     Cervical back: Normal range of motion.  Skin:    General: Skin is warm.  Neurological:     General: No focal deficit present.     Mental Status: She is alert and oriented to person, place, and time. Mental status is at baseline.  Psychiatric:        Mood and Affect: Mood normal.        Behavior: Behavior normal.        Thought Content: Thought content normal.    PDMP reviewed Narcotic score 130, Sedative score 300, Stimulant score 231, Overdose risk 80  Assessment/Plan: 1. Attention deficit disorder (ADD) in adult Corrected her prescription as she only takes Adderall once daily, may continue taking 1/2 tablet on days she feels this dose if sufficient Discussed her lack of focus and concentration likely from her sleep disturbances, will work on improving her sleep with plans to improve her focus and concentration--may not require Adderall at that time - amphetamine-dextroamphetamine (ADDERALL) 10 MG tablet; Take 1 tablet (10 mg total) by mouth daily with breakfast.  Dispense: 30 tablet; Refill: 0  2. Sleep disturbance Advised to try Remeron to help with her sleep---not to combine with Klonopin, may try 7.5 for 1 week  and if needed may titrate herself up to 15 mg Will review sleep study to assess for potential underlying OSA - mirtazapine (REMERON) 7.5 MG tablet; Take 1 tablet (7.5 mg total) by mouth at bedtime.  Dispense: 30 tablet; Refill: 0 - Home sleep test  3. Flu vaccine need - Flu Vaccine MDCK QUAD PF  4. Encounter for long-term (current) use of medications - POCT Urine Drug Screen  General Counseling: Darla verbalizes understanding of the findings of todays visit and agrees with plan of treatment. I have discussed any further diagnostic evaluation that may be needed or ordered today. We also reviewed her medications today. she has been encouraged to call the office with any questions or concerns that should arise related to todays visit.    Orders Placed This Encounter  Procedures  . Flu Vaccine MDCK QUAD PF  . POCT Urine Drug Screen  . Home sleep test    Meds ordered this encounter  Medications  . amphetamine-dextroamphetamine (ADDERALL) 10 MG tablet    Sig: Take 1 tablet (10 mg total) by mouth daily with breakfast.    Dispense:  30 tablet    Refill:  0  . mirtazapine (REMERON) 7.5 MG tablet    Sig: Take 1 tablet (7.5 mg total) by mouth at bedtime.    Dispense:  30 tablet    Refill:  0    Time spent: 30 Minutes Time spent includes review of chart, medications, test results and follow-up plan with the patient.  This patient was seen by Theodoro Grist AGNP-C in Collaboration with Dr Lavera Guise as a part of collaborative care agreement     Kristiane Morsch. Chike Farrington AGNP-C Internal medicine

## 2020-04-19 ENCOUNTER — Encounter: Payer: Self-pay | Admitting: Hospice and Palliative Medicine

## 2020-05-10 DIAGNOSIS — Z20822 Contact with and (suspected) exposure to covid-19: Secondary | ICD-10-CM | POA: Diagnosis not present

## 2020-05-16 ENCOUNTER — Other Ambulatory Visit (HOSPITAL_COMMUNITY)
Admission: RE | Admit: 2020-05-16 | Discharge: 2020-05-16 | Disposition: A | Discharge status: Home / Self Care | Payer: 59 | Source: Ambulatory Visit | Attending: Obstetrics and Gynecology | Admitting: Obstetrics and Gynecology

## 2020-05-16 ENCOUNTER — Encounter: Payer: Self-pay | Admitting: Obstetrics and Gynecology

## 2020-05-16 ENCOUNTER — Ambulatory Visit (INDEPENDENT_AMBULATORY_CARE_PROVIDER_SITE_OTHER): Payer: 59 | Admitting: Obstetrics and Gynecology

## 2020-05-16 ENCOUNTER — Other Ambulatory Visit: Payer: Self-pay

## 2020-05-16 VITALS — BP 114/72 | Ht 60.0 in | Wt 172.8 lb

## 2020-05-16 DIAGNOSIS — Z124 Encounter for screening for malignant neoplasm of cervix: Secondary | ICD-10-CM | POA: Insufficient documentation

## 2020-05-16 DIAGNOSIS — Z1239 Encounter for other screening for malignant neoplasm of breast: Secondary | ICD-10-CM | POA: Diagnosis not present

## 2020-05-16 DIAGNOSIS — Z01419 Encounter for gynecological examination (general) (routine) without abnormal findings: Secondary | ICD-10-CM

## 2020-05-16 NOTE — Patient Instructions (Signed)
[facility] [address] [place] [address]  [facility] [place]  [address] [place] [address]  Phone: [phone]  

## 2020-05-16 NOTE — Progress Notes (Signed)
Gynecology Annual Exam  PCP: Ronnell Freshwater, NP  Chief Complaint:  Chief Complaint  Patient presents with  . Gynecologic Exam    annual exam    History of Present Illness:Patient is a 54 y.o. G3P3003 presents for annual exam. The patient has no complaints today.   LMP: No LMP recorded. Patient has had a hysterectomy.  No gynecologic concerns in past year.  She does report intermittent LLQ pain, this is exacerbated by standing or activity.  She does lift at work.  No changes in bowl movements, colonoscopy up to date no history of diverticulosis.  The patient does perform self breast exams.  There is notable family history of breast or ovarian cancer in her family.  The patient wears seatbelts: yes.   The patient has regular exercise: not asked.    The patient reports current symptoms of depression, currently being managed at [facility].     Review of Systems: Review of Systems  Constitutional: Negative for chills and fever.  HENT: Negative for congestion.   Respiratory: Negative for cough and shortness of breath.   Cardiovascular: Negative for chest pain and palpitations.  Gastrointestinal: Negative for abdominal pain, constipation, diarrhea, heartburn, nausea and vomiting.  Genitourinary: Negative for dysuria, frequency and urgency.  Skin: Negative for itching and rash.  Neurological: Negative for dizziness and headaches.  Endo/Heme/Allergies: Negative for polydipsia.  Psychiatric/Behavioral: Negative for depression.    Past Medical History:  Patient Active Problem List   Diagnosis Date Noted  . Other fatigue 01/17/2020  . Attention deficit disorder (ADD) in adult 12/27/2019  . Acute upper respiratory infection 04/27/2019  . Exposure to COVID-19 virus 04/27/2019  . History of abnormal cervical Pap smear 04/02/2019    03/20/2018 NIL HPV negative 03/15/2017 Negative colposcopy 01/24/2017 NIL HR HPV positive 03/08/2016 Colposcopy negative and normal  biopsies 01/23/2016 NIL HR HPV positive 01/11/2015 LGSIL HPV negative 10/07/2014 LGSIL HPV negative 08/11/2014 LGSIL HPV negative 05/11/2014 LGSIL HPV negative 10/29/2013 LGSIL HPV positive 11/10/2012 LGSIL HPV negative 09/30/2013 LGSIL HPV positive 03/08/2011 NIL HPV negative 02/22/2011 NIL HPV negative 12/09/2010 NIL HPV positive   . Vitamin D deficiency 11/09/2018  . Psoriasis 04/12/2018  . Cutaneous candidiasis 03/16/2018  . Moderate obesity 02/15/2018  . 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 Gastroesophageal reflux disease without esophagitis 09/04/2017  . GAD (generalized anxiety disorder) 09/04/2017    Past Surgical History:  Past Surgical History:  Procedure Laterality Date  . ASD REPAIR  1985  . CARPAL TUNNEL RELEASE    . CESAREAN SECTION    . COLONOSCOPY WITH PROPOFOL N/A 04/11/2017   Procedure: COLONOSCOPY WITH PROPOFOL;  Surgeon: Jonathon Bellows, MD;  Location: [facility] ENDOSCOPY;  Service: Gastroenterology;  Laterality: N/A;  . COLPOSCOPY  2012  . ENDOMETRIAL ABLATION     Failed ablation x2 (Tech difficulties)  . LAPAROSCOPIC VAGINAL HYSTERECTOMY WITH SALPINGO OOPHORECTOMY  2012   Adenomyosis for AUB  . TONSILLECTOMY    . TUBAL LIGATION      Gynecologic History:  No LMP recorded. Patient has had a hysterectomy. Last Pap: Results were: 04/02/2019 NIL and HR HPV negative  03/20/2018 NIL HPV negative 03/15/2017 Negative colposcopy 01/24/2017 NIL HR HPV positive 03/08/2016 Colposcopy negative and normal biopsies 01/23/2016 NIL HR HPV positive 01/11/2015 LGSIL HPV negative 10/07/2014 LGSIL HPV negative 08/11/2014 LGSIL HPV negative 05/11/2014 LGSIL HPV negative 10/29/2013 LGSIL HPV positive 11/10/2012 LGSIL HPV negative 09/30/2013 LGSIL HPV positive 03/08/2011 NIL HPV negative 02/22/2011 NIL HPV negative 12/09/2010 NIL HPV positive Last mammogram: 06/17/2019  Results were: BI-RAD I  Obstetric History: [id number]  Family History:  Family History  Problem Relation Age of Onset  . Breast  cancer Paternal Grandmother 51  . Multiple myeloma Father   . Thyroid disease Paternal Aunt     Social History:  Social History   Socioeconomic History  . Marital status: Married    Spouse name: Not on file  . Number of children: Not on file  . Years of education: Not on file  . Highest education level: Not on file  Occupational History  . Not on file  Tobacco Use  . Smoking status: Never Smoker  . Smokeless tobacco: Never Used  Vaping Use  . Vaping Use: Never used  Substance and Sexual Activity  . Alcohol use: No  . Drug use: No  . Sexual activity: Yes    Partners: Male    Birth control/protection: None  Other Topics Concern  . Not on file  Social History Narrative  . Not on file   Social Determinants of Health   Financial Resource Strain:   . Difficulty of Paying Living Expenses: Not on file  Food Insecurity:   . Worried About Charity fundraiser in the Last Year: Not on file  . Ran Out of Food in the Last Year: Not on file  Transportation Needs:   . Lack of Transportation (Medical): Not on file  . Lack of Transportation (Non-Medical): Not on file  Physical Activity:   . Days of Exercise per Week: Not on file  . Minutes of Exercise per Session: Not on file  Stress:   . Feeling of Stress : Not on file  Social Connections:   . Frequency of Communication with Friends and Family: Not on file  . Frequency of Social Gatherings with Friends and Family: Not on file  . Attends Religious Services: Not on file  . Active Member of Clubs or Organizations: Not on file  . Attends Archivist Meetings: Not on file  . Marital Status: Not on file  Intimate Partner Violence:   . Fear of Current or Ex-Partner: Not on file  . Emotionally Abused: Not on file  . Physically Abused: Not on file  . Sexually Abused: Not on file    Allergies:  No Known Allergies  Medications: Prior to Admission medications   Medication Sig Start Date End Date Taking? Authorizing  Provider  amphetamine-dextroamphetamine (ADDERALL) 10 MG tablet Take 1 tablet (10 mg total) by mouth daily with breakfast. 04/14/20   Luiz Ochoa, NP  b complex vitamins capsule Take 1 capsule by mouth daily.    [provider]  clonazePAM (KLONOPIN) 0.5 MG tablet TAKE 1 TABLET(0.5 MG) BY MOUTH TWICE DAILY AS NEEDED FOR ANXIETY 02/05/20   Ronnell Freshwater, NP  ergocalciferol (DRISDOL) 1.25 MG (50000 UT) capsule Take 1 capsule (50,000 Units total) by mouth once a week. 11/16/19   Kendell Bane, NP  estradiol (ESTRACE VAGINAL) 0.1 MG/GM vaginal cream Place 1 Applicatorful vaginally 3 (three) times a week. 02/08/20   Malachy Mood, MD  etanercept (ENBREL) 50 MG/ML injection Inject 50 mg into the skin once a week. 08/27/18   [provider]  etodolac (LODINE) 400 MG tablet TK 1 T PO BID 10/04/18   [provider]  FLUoxetine (PROZAC) 20 MG capsule Take 1 capsule (20 mg total) by mouth daily. 02/24/20   Ronnell Freshwater, NP  fluticasone (FLONASE) 50 MCG/ACT nasal spray Place 2 sprays into both nostrils daily. 07/28/19  Ronnell Freshwater, NP  mirtazapine (REMERON) 7.5 MG tablet Take 1 tablet (7.5 mg total) by mouth at bedtime. 04/14/20   Luiz Ochoa, NP  omeprazole (PRILOSEC) 20 MG capsule Take 1 capsule (20 mg total) by mouth daily. 12/15/19   Ronnell Freshwater, NP    Physical Exam Vitals: Blood pressure 114/72, height 5' (1.524 m), weight 172 lb 12.8 oz (78.4 kg).   General: NAD, well nourished, appears stated age 82: normocephalic, anicteric Thyroid: no enlargement, no palpable nodules Pulmonary: No increased work of breathing, CTAB Cardiovascular: RRR, distal pulses 2+ Breast: Breast symmetrical, no tenderness, no palpable nodules or masses, no skin or nipple retraction present, no nipple discharge.  No axillary or supraclavicular lymphadenopathy. Abdomen: NABS, soft, non-tender, non-distended.  Umbilicus without lesions.  No hepatomegaly, splenomegaly or  masses palpable. No evidence of hernia  Genitourinary:  External: Normal external female genitalia.  Normal urethral meatus, normal Bartholin's and Skene's glands.    Vagina: Normal vaginal mucosa, no evidence of prolapse.    Cervix: Grossly normal in appearance, no bleeding  Uterus: surgically absent  Adnexa: ovaries non-enlarged, no adnexal masses  Rectal: deferred  Lymphatic: no evidence of inguinal lymphadenopathy Extremities: no edema, erythema, or tenderness Neurologic: Grossly intact Psychiatric: mood appropriate, affect full  Female chaperone present for pelvic and breast  portions of the physical exam  Immunization History  Administered Date(s) Administered  . Influenza Inj Mdck Quad Pf 04/14/2020  . Influenza,inj,Quad PF,6+ Mos 04/02/2019  . PFIZER SARS-COV-2 Vaccination 07/14/2019, 08/04/2019   Assessment: 54 y.o. G3P3003 routine annual exam  Plan: Problem List Items Addressed This Visit    None    Visit Diagnoses    Encounter for gynecological examination without abnormal finding    -  Primary   Screening for malignant neoplasm of cervix       Relevant Orders   Cytology - PAP   Breast screening       Relevant Orders   MM 3D SCREEN BREAST BILATERAL      1) Mammogram - recommend yearly screening mammogram.  Mammogram Was ordered today  2) STI screening  was notoffered and therefore not obtained  3) ASCCP guidelines and rational discussed.  Patient opts for yearly screening interval  4) Osteoporosis  - per USPTF routine screening DEXA at age 56  5) Routine healthcare maintenance including cholesterol, diabetes screening discussed managed by PCP  6) Colonoscopy UTD 04/11/2017  7) Return in about 1 year (around 05/16/2021) for annual.    Malachy Mood, MD [facility], [facility] 05/16/2020, 1:26 PM

## 2020-05-20 LAB — CYTOLOGY - PAP
Comment: NEGATIVE
Diagnosis: NEGATIVE
High risk HPV: NEGATIVE

## 2020-05-26 ENCOUNTER — Ambulatory Visit: Payer: 59 | Admitting: Hospice and Palliative Medicine

## 2020-06-23 ENCOUNTER — Ambulatory Visit
Admission: RE | Admit: 2020-06-23 | Discharge: 2020-06-23 | Disposition: A | Discharge status: Home / Self Care | Payer: 59 | Source: Ambulatory Visit | Attending: Obstetrics and Gynecology | Admitting: Obstetrics and Gynecology

## 2020-06-23 ENCOUNTER — Other Ambulatory Visit: Payer: Self-pay

## 2020-06-23 DIAGNOSIS — Z1231 Encounter for screening mammogram for malignant neoplasm of breast: Secondary | ICD-10-CM | POA: Diagnosis not present

## 2020-06-23 DIAGNOSIS — Z1239 Encounter for other screening for malignant neoplasm of breast: Secondary | ICD-10-CM

## 2020-06-27 ENCOUNTER — Ambulatory Visit: Payer: 59 | Admitting: Hospice and Palliative Medicine

## 2020-06-29 ENCOUNTER — Other Ambulatory Visit: Payer: Self-pay | Admitting: Adult Health

## 2020-06-29 DIAGNOSIS — E559 Vitamin D deficiency, unspecified: Secondary | ICD-10-CM

## 2020-07-06 ENCOUNTER — Telehealth: Payer: Self-pay

## 2020-07-06 DIAGNOSIS — E559 Vitamin D deficiency, unspecified: Secondary | ICD-10-CM

## 2020-07-06 DIAGNOSIS — F411 Generalized anxiety disorder: Secondary | ICD-10-CM

## 2020-07-06 MED ORDER — FLUOXETINE HCL 20 MG PO CAPS: 20.0000 mg | ORAL_CAPSULE | Freq: Every day | ORAL | 1 refills | Status: DC

## 2020-07-06 MED ORDER — ERGOCALCIFEROL 1.25 MG (50000 UT) PO CAPS: 50000.0000 [IU] | ORAL_CAPSULE | ORAL | 1 refills | Status: DC

## 2020-07-06 NOTE — Telephone Encounter (Signed)
meds sent to pharmacy

## 2020-07-07 ENCOUNTER — Encounter: Payer: Self-pay | Admitting: Hospice and Palliative Medicine

## 2020-07-07 ENCOUNTER — Ambulatory Visit: Payer: 59 | Admitting: Hospice and Palliative Medicine

## 2020-07-07 VITALS — BP 106/78 | HR 82 | Temp 97.3°F | Resp 16 | Ht 60.0 in | Wt 174.0 lb

## 2020-07-07 DIAGNOSIS — Z79899 Other long term (current) drug therapy: Secondary | ICD-10-CM | POA: Diagnosis not present

## 2020-07-07 DIAGNOSIS — Z0001 Encounter for general adult medical examination with abnormal findings: Secondary | ICD-10-CM

## 2020-07-07 DIAGNOSIS — G479 Sleep disorder, unspecified: Secondary | ICD-10-CM

## 2020-07-07 DIAGNOSIS — R69 Illness, unspecified: Secondary | ICD-10-CM | POA: Diagnosis not present

## 2020-07-07 DIAGNOSIS — M1990 Unspecified osteoarthritis, unspecified site: Secondary | ICD-10-CM

## 2020-07-07 DIAGNOSIS — F988 Other specified behavioral and emotional disorders with onset usually occurring in childhood and adolescence: Secondary | ICD-10-CM | POA: Diagnosis not present

## 2020-07-07 MED ORDER — AMPHETAMINE-DEXTROAMPHETAMINE 10 MG PO TABS: 10.0000 mg | ORAL_TABLET | Freq: Every day | ORAL | 0 refills | Status: DC

## 2020-07-07 MED ORDER — ETODOLAC 400 MG PO TABS: ORAL_TABLET | ORAL | 2 refills | Status: DC

## 2020-07-07 NOTE — Progress Notes (Signed)
[facility] [place], [place] [address]  Internal MEDICINE  Office Visit Note  Patient Name: Kristine Stephens  [id number]  [id number]  Date of Service: 07/14/2020  Chief Complaint  Patient presents with  . Follow-up    Refill request  . Anxiety  . Depression  . Gastroesophageal Reflux    HPI Patient is here for routine follow-up Overall, she has been doing well Continues to take 10 mg Adderall during the week to help with concentration at work, notices a significant improvement in her ability to stay focused and successful at work with Adderall Skips doses on the weekends and holidays  Insomnia--unable to tolerate low dose Remeron, has weaned herself off of clonazepam, sleeping well at this time without medication assistance Feels her sleep has improved since stopping Clonazepam  Takes etodolac as needed a few days before time for enbrel and her arthritis is causing significant pain  Colonoscopy completed in 2018  Current Medication: Outpatient Encounter Medications as of 07/07/2020  Medication Sig  . b complex vitamins capsule Take 1 capsule by mouth daily.  . ergocalciferol (DRISDOL) 1.25 MG (50000 UT) capsule Take 1 capsule (50,000 Units total) by mouth once a week.  . estradiol (ESTRACE VAGINAL) 0.1 MG/GM vaginal cream Place 1 Applicatorful vaginally 3 (three) times a week.  . etanercept (ENBREL) 50 MG/ML injection Inject 50 mg into the skin once a week.  Marland Kitchen FLUoxetine (PROZAC) 20 MG capsule Take 1 capsule (20 mg total) by mouth daily.  Marland Kitchen omeprazole (PRILOSEC) 20 MG capsule Take 1 capsule (20 mg total) by mouth daily.  . [DISCONTINUED] amphetamine-dextroamphetamine (ADDERALL) 10 MG tablet Take 1 tablet (10 mg total) by mouth daily with breakfast.  . [DISCONTINUED] etodolac (LODINE) 400 MG tablet TK 1 T PO BID  . amphetamine-dextroamphetamine (ADDERALL) 10 MG tablet Take 1 tablet (10 mg total) by mouth daily with breakfast.  . etodolac (LODINE) 400  MG tablet TK 1 T PO BID  . [DISCONTINUED] amphetamine-dextroamphetamine (ADDERALL) 10 MG tablet Take 1 tablet (10 mg total) by mouth daily with breakfast.  . [DISCONTINUED] clonazePAM (KLONOPIN) 0.5 MG tablet TAKE 1 TABLET(0.5 MG) BY MOUTH TWICE DAILY AS NEEDED FOR ANXIETY (Patient not taking: Reported on 05/16/2020)  . [DISCONTINUED] fluticasone (FLONASE) 50 MCG/ACT nasal spray Place 2 sprays into both nostrils daily.  . [DISCONTINUED] mirtazapine (REMERON) 7.5 MG tablet Take 1 tablet (7.5 mg total) by mouth at bedtime. (Patient not taking: Reported on 05/16/2020)   No facility-administered encounter medications on file as of 07/07/2020.    Surgical History: Past Surgical History:  Procedure Laterality Date  . ASD REPAIR  1985  . CARPAL TUNNEL RELEASE    . CESAREAN SECTION    . COLONOSCOPY WITH PROPOFOL N/A 04/11/2017   Procedure: COLONOSCOPY WITH PROPOFOL;  Surgeon: Jonathon Bellows, MD;  Location: [facility] ENDOSCOPY;  Service: Gastroenterology;  Laterality: N/A;  . COLPOSCOPY  2012  . ENDOMETRIAL ABLATION     Failed ablation x2 (Tech difficulties)  . LAPAROSCOPIC VAGINAL HYSTERECTOMY WITH SALPINGO OOPHORECTOMY  2012   Adenomyosis for AUB  . TONSILLECTOMY    . TUBAL LIGATION      Medical History: Past Medical History:  Diagnosis Date  . Anxiety   . Depression   . GERD (gastroesophageal reflux disease)   . Psoriasis     Family History: Family History  Problem Relation Age of Onset  . Breast cancer Paternal Grandmother 32  . Multiple myeloma Father   . Thyroid disease Paternal Aunt  Social History   Socioeconomic History  . Marital status: Married    Spouse name: Not on file  . Number of children: Not on file  . Years of education: Not on file  . Highest education level: Not on file  Occupational History  . Not on file  Tobacco Use  . Smoking status: Never Smoker  . Smokeless tobacco: Never Used  Vaping Use  . Vaping Use: Never used  Substance and Sexual Activity  .  Alcohol use: No  . Drug use: No  . Sexual activity: Yes    Partners: Male    Birth control/protection: None  Other Topics Concern  . Not on file  Social History Narrative  . Not on file   Social Determinants of Health   Financial Resource Strain: Not on file  Food Insecurity: Not on file  Transportation Needs: Not on file  Physical Activity: Not on file  Stress: Not on file  Social Connections: Not on file  Intimate Partner Violence: Not on file   Review of Systems  Constitutional: Negative for chills, diaphoresis and fatigue.  HENT: Negative for ear pain, postnasal drip and sinus pressure.   Eyes: Negative for photophobia, discharge, redness, itching and visual disturbance.  Respiratory: Negative for cough, shortness of breath and wheezing.   Cardiovascular: Negative for chest pain, palpitations and leg swelling.  Gastrointestinal: Negative for abdominal pain, constipation, diarrhea, nausea and vomiting.  Genitourinary: Negative for dysuria and flank pain.  Musculoskeletal: Negative for arthralgias, back pain, gait problem and neck pain.  Skin: Negative for color change.  Allergic/Immunologic: Negative for environmental allergies and food allergies.  Neurological: Negative for dizziness and headaches.  Hematological: Does not bruise/bleed easily.  Psychiatric/Behavioral: Negative for agitation, behavioral problems (depression) and hallucinations.    Vital Signs: BP 106/78   Pulse 82   Temp (!) 97.3 F (36.3 C)   Resp 16   Ht 5' (1.524 m)   Wt 174 lb (78.9 kg)   SpO2 97%   BMI 33.98 kg/m    Physical Exam Vitals reviewed.  Constitutional:      Appearance: Normal appearance. She is normal weight.  Cardiovascular:     Rate and Rhythm: Normal rate and regular rhythm.     Pulses: Normal pulses.     Heart sounds: Normal heart sounds.  Pulmonary:     Effort: Pulmonary effort is normal.     Breath sounds: Normal breath sounds.  Abdominal:     General: Abdomen is  flat.     Palpations: Abdomen is soft.  Musculoskeletal:        General: Normal range of motion.     Cervical back: Normal range of motion.  Skin:    General: Skin is warm.  Neurological:     General: No focal deficit present.     Mental Status: She is alert and oriented to person, place, and time. Mental status is at baseline.  Psychiatric:        Mood and Affect: Mood normal.        Behavior: Behavior normal.        Thought Content: Thought content normal.        Judgment: Judgment normal.    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 Sleep disturbance Improved, has weaned herself off clonazepam, sleeping well each night  3. Attention deficit disorder (ADD) in adult Continue low dose Adderall and avoid use on weekends 2 scripts given, to be filled today and not before 1/28 PDMP reviewed, overdose risk 130 - amphetamine-dextroamphetamine (ADDERALL) 10 MG tablet; Take 1 tablet (10 mg total) by mouth daily with breakfast.  Dispense: 30 tablet; Refill: 0  4. High risk medication use - CBC w/Diff/Platelet - Comprehensive Metabolic Panel (CMET) - Lipid Panel With LDL/HDL Ratio - TSH + free T4  5. Encounter for routine adult health examination with abnormal findings `- CBC w/Diff/Platelet - Comprehensive Metabolic Panel (CMET) - Lipid Panel With LDL/HDL Ratio - TSH + free T4  General Counseling: Cree verbalizes understanding of the findings of todays visit and agrees with plan of treatment. I have discussed any further diagnostic evaluation that may be needed or ordered today. We also reviewed her medications today. she has been encouraged to call the office with any questions or concerns that should arise related to todays visit.    Orders Placed This Encounter  Procedures  . CBC  w/Diff/Platelet  . Comprehensive Metabolic Panel (CMET)  . Lipid Panel With LDL/HDL Ratio  . TSH + free T4    Meds ordered this encounter  Medications  . DISCONTD: amphetamine-dextroamphetamine (ADDERALL) 10 MG tablet    Sig: Take 1 tablet (10 mg total) by mouth daily with breakfast.    Dispense:  30 tablet    Refill:  0  . amphetamine-dextroamphetamine (ADDERALL) 10 MG tablet    Sig: Take 1 tablet (10 mg total) by mouth daily with breakfast.    Dispense:  30 tablet    Refill:  0    Do not fill before 08/05/20  . etodolac (LODINE) 400 MG tablet    Sig: TK 1 T PO BID    Dispense:  60 tablet    Refill:  2    Time spent: 30 Minutes Time spent includes review of chart, medications, test results and follow-up plan with the patient.  This patient was seen by Theodoro Grist AGNP-C in Collaboration with Dr Lavera Guise as a part of collaborative care agreement     Mashawn Brazil. Ozell Juhasz AGNP-C Internal medicine

## 2020-07-11 ENCOUNTER — Ambulatory Visit: Payer: 59 | Admitting: Hospice and Palliative Medicine

## 2020-07-14 ENCOUNTER — Encounter: Payer: Self-pay | Admitting: Hospice and Palliative Medicine

## 2020-09-07 ENCOUNTER — Encounter: Payer: Self-pay | Admitting: Hospice and Palliative Medicine

## 2020-09-07 ENCOUNTER — Other Ambulatory Visit: Payer: Self-pay

## 2020-09-07 ENCOUNTER — Ambulatory Visit: Payer: 59 | Admitting: Hospice and Palliative Medicine

## 2020-09-07 VITALS — BP 108/66 | HR 100 | Temp 97.8°F | Resp 16 | Ht 60.0 in | Wt 170.3 lb

## 2020-09-07 DIAGNOSIS — F988 Other specified behavioral and emotional disorders with onset usually occurring in childhood and adolescence: Secondary | ICD-10-CM

## 2020-09-07 DIAGNOSIS — R5383 Other fatigue: Secondary | ICD-10-CM

## 2020-09-07 DIAGNOSIS — J0140 Acute pansinusitis, unspecified: Secondary | ICD-10-CM

## 2020-09-07 DIAGNOSIS — R059 Cough, unspecified: Secondary | ICD-10-CM

## 2020-09-07 LAB — POC SOFIA 2 FLU + SARS ANTIGEN FIA
Influenza A, POC: NEGATIVE
Influenza B, POC: NEGATIVE
SARS Coronavirus 2 Ag: NEGATIVE

## 2020-09-07 MED ORDER — AMPHETAMINE-DEXTROAMPHETAMINE 10 MG PO TABS
10.0000 mg | ORAL_TABLET | Freq: Every day | ORAL | 0 refills | Status: DC
Start: 2020-09-07 — End: 2020-10-17

## 2020-09-07 MED ORDER — PREDNISONE 10 MG PO TABS: ORAL_TABLET | ORAL | 0 refills | Status: DC

## 2020-09-07 MED ORDER — AMPHETAMINE-DEXTROAMPHETAMINE 10 MG PO TABS: 10.0000 mg | ORAL_TABLET | Freq: Every day | ORAL | 0 refills | Status: DC

## 2020-09-07 MED ORDER — BENZONATATE 100 MG PO CAPS: 100.0000 mg | ORAL_CAPSULE | Freq: Two times a day (BID) | ORAL | 0 refills | Status: DC | PRN

## 2020-09-07 MED ORDER — AZITHROMYCIN 250 MG PO TABS: ORAL_TABLET | ORAL | 0 refills | Status: DC

## 2020-09-07 NOTE — Progress Notes (Signed)
[facility] [place], [place] [address]  Internal MEDICINE  Office Visit Note  Patient Name: Kristine Stephens  [id number]  [id number]  Date of Service: 09/08/2020  Chief Complaint  Patient presents with  . Follow-up    Cough, congestion, sore throat for 3 or 4 days, body aches, yesterday started feeling bad, last week felt like a cold, has not been tested for covid, fully vaccinate and booster  . Gastroesophageal Reflux  . Depression  . Anxiety    HPI Patient is here for routine follow-up Continues on low dose Adderall as needed during busy work days to stay focused and motivated Only takes as needed and does not take medication on off days or weekends Denies any negative side effects from taking medication, denies sleep disturbances, headaches, chest pain or palpitations  Due for routine fasting labs  C/o nasal congestion, sinus pain and pressure, headaches, body aches, fevers and cough Symptoms started over the weekend, has been taking OTC cold and sinus medicine without relief of her sympotms   Current Medication: Outpatient Encounter Medications as of 09/07/2020  Medication Sig  . amphetamine-dextroamphetamine (ADDERALL) 10 MG tablet Take 1 tablet (10 mg total) by mouth daily with breakfast.  . azithromycin (ZITHROMAX Z-PAK) 250 MG tablet Take two 250 mg tablets on day one followed by one 250 mg tablet each day for four days.  Marland Kitchen b complex vitamins capsule Take 1 capsule by mouth daily.  . benzonatate (TESSALON) 100 MG capsule Take 1 capsule (100 mg total) by mouth 2 (two) times daily as needed for cough.  . ergocalciferol (DRISDOL) 1.25 MG (50000 UT) capsule Take 1 capsule (50,000 Units total) by mouth once a week.  . estradiol (ESTRACE VAGINAL) 0.1 MG/GM vaginal cream Place 1 Applicatorful vaginally 3 (three) times a week.  . etanercept (ENBREL) 50 MG/ML injection Inject 50 mg into the skin once a week.  . etodolac (LODINE) 400 MG tablet TK 1 T PO BID   . FLUoxetine (PROZAC) 20 MG capsule Take 1 capsule (20 mg total) by mouth daily.  Marland Kitchen omeprazole (PRILOSEC) 20 MG capsule Take 1 capsule (20 mg total) by mouth daily.  . predniSONE (DELTASONE) 10 MG tablet Take 1 tablet three times a day with a meal for three for three days, take 1 tablet by twice daily with a meal for 3 days, take 1 tablet once daily with a meal for 3 days  . [DISCONTINUED] amphetamine-dextroamphetamine (ADDERALL) 10 MG tablet Take 1 tablet (10 mg total) by mouth daily with breakfast.  . amphetamine-dextroamphetamine (ADDERALL) 10 MG tablet Take 1 tablet (10 mg total) by mouth daily with breakfast.   No facility-administered encounter medications on file as of 09/07/2020.    Surgical History: Past Surgical History:  Procedure Laterality Date  . ASD REPAIR  1985  . CARPAL TUNNEL RELEASE    . CESAREAN SECTION    . COLONOSCOPY WITH PROPOFOL N/A 04/11/2017   Procedure: COLONOSCOPY WITH PROPOFOL;  Surgeon: Jonathon Bellows, MD;  Location: [facility] ENDOSCOPY;  Service: Gastroenterology;  Laterality: N/A;  . COLPOSCOPY  2012  . ENDOMETRIAL ABLATION     Failed ablation x2 (Tech difficulties)  . LAPAROSCOPIC VAGINAL HYSTERECTOMY WITH SALPINGO OOPHORECTOMY  2012   Adenomyosis for AUB  . TONSILLECTOMY    . TUBAL LIGATION      Medical History: Past Medical History:  Diagnosis Date  . Anxiety   . Depression   . GERD (gastroesophageal reflux disease)   . Psoriasis  Family History: Family History  Problem Relation Age of Onset  . Breast cancer Paternal Grandmother 90  . Multiple myeloma Father   . Thyroid disease Paternal Aunt     Social History   Socioeconomic History  . Marital status: Married    Spouse name: Not on file  . Number of children: Not on file  . Years of education: Not on file  . Highest education level: Not on file  Occupational History  . Not on file  Tobacco Use  . Smoking status: Never Smoker  . Smokeless tobacco: Never Used  Vaping Use  . Vaping  Use: Never used  Substance and Sexual Activity  . Alcohol use: No  . Drug use: No  . Sexual activity: Yes    Partners: Male    Birth control/protection: None  Other Topics Concern  . Not on file  Social History Narrative  . Not on file   Social Determinants of Health   Financial Resource Strain: Not on file  Food Insecurity: Not on file  Transportation Needs: Not on file  Physical Activity: Not on file  Stress: Not on file  Social Connections: Not on file  Intimate Partner Violence: Not on file      Review of Systems  Constitutional: Positive for fatigue and fever. Negative for chills and diaphoresis.  HENT: Positive for congestion, sinus pressure, sinus pain and voice change. Negative for ear pain and postnasal drip.   Eyes: Negative for photophobia, discharge, redness, itching and visual disturbance.  Respiratory: Positive for cough. Negative for shortness of breath and wheezing.   Cardiovascular: Negative for chest pain, palpitations and leg swelling.  Gastrointestinal: Negative for abdominal pain, constipation, diarrhea, nausea and vomiting.  Genitourinary: Negative for dysuria and flank pain.  Musculoskeletal: Negative for arthralgias, back pain, gait problem and neck pain.  Skin: Negative for color change.  Allergic/Immunologic: Negative for environmental allergies and food allergies.  Neurological: Negative for dizziness and headaches.  Hematological: Does not bruise/bleed easily.  Psychiatric/Behavioral: Negative for agitation, behavioral problems (depression) and hallucinations.    Vital Signs: BP 108/66   Pulse 100   Temp 97.8 F (36.6 C)   Resp 16   Ht 5' (1.524 m)   Wt 170 lb 4.8 oz (77.2 kg)   SpO2 99%   BMI 33.26 kg/m    Physical Exam Vitals reviewed.  Constitutional:      Appearance: Normal appearance. She is normal weight.  HENT:     Nose: Congestion present.     Mouth/Throat:     Pharynx: Posterior oropharyngeal erythema present.   Cardiovascular:     Rate and Rhythm: Normal rate and regular rhythm.     Pulses: Normal pulses.     Heart sounds: Normal heart sounds.  Pulmonary:     Effort: Pulmonary effort is normal.     Breath sounds: Normal breath sounds.  Abdominal:     General: Abdomen is flat.     Palpations: Abdomen is soft.  Musculoskeletal:        General: Normal range of motion.     Cervical back: Normal range of motion.  Skin:    General: Skin is warm.  Neurological:     General: No focal deficit present.     Mental Status: She is alert and oriented to person, place, and time. Mental status is at baseline.  Psychiatric:        Mood and Affect: Mood normal.        Behavior: Behavior normal.  Thought Content: Thought content normal.        Judgment: Judgment normal.    Assessment/Plan: 1. Acute non-recurrent pansinusitis Treat with ZPAK, short course prednisone taper and Tessalon Increase fluid intake and rest, may take OTC Mucinex - azithromycin (ZITHROMAX Z-PAK) 250 MG tablet; Take two 250 mg tablets on day one followed by one 250 mg tablet each day for four days.  Dispense: 6 each; Refill: 0 - predniSONE (DELTASONE) 10 MG tablet; Take 1 tablet three times a day with a meal for three for three days, take 1 tablet by twice daily with a meal for 3 days, take 1 tablet once daily with a meal for 3 days  Dispense: 18 tablet; Refill: 0 - benzonatate (TESSALON) 100 MG capsule; Take 1 capsule (100 mg total) by mouth 2 (two) times daily as needed for cough.  Dispense: 20 capsule; Refill: 0  2. Attention deficit disorder (ADD) in adult Continue with current dosing [place] Controlled Substance Database was reviewed by me for overdose risk score (ORS) - amphetamine-dextroamphetamine (ADDERALL) 10 MG tablet; Take 1 tablet (10 mg total) by mouth daily with breakfast.  Dispense: 30 tablet; Refill: 0 - amphetamine-dextroamphetamine (ADDERALL) 10 MG tablet; Take 1 tablet (10 mg total) by mouth daily with  breakfast.  Dispense: 30 tablet; Refill: 0  3. Cough COVID and influenza testing negative - POC SOFIA 2 FLU + SARS ANTIGEN FIA - POCT Influenza A/B  4. Other fatigue - CBC w/Diff/Platelet - Comprehensive Metabolic Panel (CMET) - Lipid Panel With LDL/HDL Ratio - TSH + free T4 - Vitamin D (25 hydroxy) - B12  General Counseling: Lillyrose verbalizes understanding of the findings of todays visit and agrees with plan of treatment. I have discussed any further diagnostic evaluation that may be needed or ordered today. We also reviewed her medications today. she has been encouraged to call the office with any questions or concerns that should arise related to todays visit.    Orders Placed This Encounter  Procedures  . CBC w/Diff/Platelet  . Comprehensive Metabolic Panel (CMET)  . Lipid Panel With LDL/HDL Ratio  . TSH + free T4  . Vitamin D (25 hydroxy)  . B12  . POC SOFIA 2 FLU + SARS ANTIGEN FIA  . POCT Influenza A/B    Meds ordered this encounter  Medications  . azithromycin (ZITHROMAX Z-PAK) 250 MG tablet    Sig: Take two 250 mg tablets on day one followed by one 250 mg tablet each day for four days.    Dispense:  6 each    Refill:  0  . predniSONE (DELTASONE) 10 MG tablet    Sig: Take 1 tablet three times a day with a meal for three for three days, take 1 tablet by twice daily with a meal for 3 days, take 1 tablet once daily with a meal for 3 days    Dispense:  18 tablet    Refill:  0  . benzonatate (TESSALON) 100 MG capsule    Sig: Take 1 capsule (100 mg total) by mouth 2 (two) times daily as needed for cough.    Dispense:  20 capsule    Refill:  0  . amphetamine-dextroamphetamine (ADDERALL) 10 MG tablet    Sig: Take 1 tablet (10 mg total) by mouth daily with breakfast.    Dispense:  30 tablet    Refill:  0  . amphetamine-dextroamphetamine (ADDERALL) 10 MG tablet    Sig: Take 1 tablet (10 mg total) by mouth daily with breakfast.  Dispense:  30 tablet    Refill:  0     Do not fill prior to April 1st    Time spent: 30 Minutes Time spent includes review of chart, medications, test results and follow-up plan with the patient.  This patient was seen by Theodoro Grist AGNP-C in Collaboration with Dr Lavera Guise as a part of collaborative care agreement     Raylynn Hersh. Leira Regino AGNP-C Internal medicine

## 2020-09-08 ENCOUNTER — Encounter: Payer: Self-pay | Admitting: Hospice and Palliative Medicine

## 2020-09-08 LAB — POCT INFLUENZA A/B

## 2020-10-10 ENCOUNTER — Other Ambulatory Visit: Payer: Self-pay | Admitting: Hospice and Palliative Medicine

## 2020-10-10 DIAGNOSIS — F988 Other specified behavioral and emotional disorders with onset usually occurring in childhood and adolescence: Secondary | ICD-10-CM

## 2020-10-10 NOTE — Telephone Encounter (Signed)
I saw her last 3/2 and sent two 30 day scripts at that time. If she needs further refills she needs to be seen.

## 2020-10-10 NOTE — Telephone Encounter (Signed)
Lov: 09/07/20 Nov: 12/08/20 LRF: 09/07/20 Please review and send to pharmacy if able to

## 2020-10-13 ENCOUNTER — Telehealth: Payer: Self-pay

## 2020-10-13 NOTE — Telephone Encounter (Signed)
Yes probably should be seen given the constellation of symptoms

## 2020-10-13 NOTE — Telephone Encounter (Signed)
Pt called reporting cramping and spotting, she states she does not use her estradiol cream as she should, has pain after sex and urine frequency. Should she be seen? Bleeding for about 2 weeks.

## 2020-10-13 NOTE — Telephone Encounter (Signed)
Patient is scheduled for 10/17/20 with AMS

## 2020-10-14 NOTE — Telephone Encounter (Signed)
Can you sign off on this as refused since there were refills left at pharmacy

## 2020-10-17 ENCOUNTER — Other Ambulatory Visit: Payer: Self-pay

## 2020-10-17 ENCOUNTER — Ambulatory Visit: Payer: Managed Care, Other (non HMO) | Admitting: Hospice and Palliative Medicine

## 2020-10-17 ENCOUNTER — Ambulatory Visit: Payer: Managed Care, Other (non HMO) | Admitting: Obstetrics and Gynecology

## 2020-10-17 ENCOUNTER — Encounter: Payer: Self-pay | Admitting: Obstetrics and Gynecology

## 2020-10-17 ENCOUNTER — Encounter: Payer: Self-pay | Admitting: Hospice and Palliative Medicine

## 2020-10-17 VITALS — BP 122/86 | Wt 174.0 lb

## 2020-10-17 VITALS — BP 114/72 | HR 82 | Temp 97.3°F | Resp 16 | Ht 60.0 in | Wt 172.6 lb

## 2020-10-17 DIAGNOSIS — N898 Other specified noninflammatory disorders of vagina: Secondary | ICD-10-CM | POA: Diagnosis not present

## 2020-10-17 DIAGNOSIS — F988 Other specified behavioral and emotional disorders with onset usually occurring in childhood and adolescence: Secondary | ICD-10-CM

## 2020-10-17 DIAGNOSIS — R8290 Unspecified abnormal findings in urine: Secondary | ICD-10-CM

## 2020-10-17 DIAGNOSIS — F339 Major depressive disorder, recurrent, unspecified: Secondary | ICD-10-CM

## 2020-10-17 DIAGNOSIS — N930 Postcoital and contact bleeding: Secondary | ICD-10-CM

## 2020-10-17 DIAGNOSIS — N760 Acute vaginitis: Secondary | ICD-10-CM

## 2020-10-17 DIAGNOSIS — N939 Abnormal uterine and vaginal bleeding, unspecified: Secondary | ICD-10-CM | POA: Diagnosis not present

## 2020-10-17 DIAGNOSIS — R350 Frequency of micturition: Secondary | ICD-10-CM

## 2020-10-17 DIAGNOSIS — O23599 Infection of other part of genital tract in pregnancy, unspecified trimester: Secondary | ICD-10-CM | POA: Diagnosis not present

## 2020-10-17 LAB — POCT URINALYSIS DIPSTICK
Bilirubin, UA: NEGATIVE
Blood, UA: NEGATIVE
Glucose, UA: POSITIVE — AB
Ketones, UA: NEGATIVE
Nitrite, UA: NEGATIVE
Protein, UA: POSITIVE — AB
Spec Grav, UA: 1.01 (ref 1.010–1.025)
Urobilinogen, UA: 2 E.U./dL — AB
pH, UA: 7 (ref 5.0–8.0)

## 2020-10-17 MED ORDER — BUPROPION HCL ER (XL) 150 MG PO TB24: 150.0000 mg | ORAL_TABLET | Freq: Every day | ORAL | 0 refills | Status: DC

## 2020-10-17 MED ORDER — NITROFURANTOIN MONOHYD MACRO 100 MG PO CAPS: 100.0000 mg | ORAL_CAPSULE | Freq: Two times a day (BID) | ORAL | 0 refills | Status: AC

## 2020-10-17 MED ORDER — AMPHETAMINE-DEXTROAMPHETAMINE 10 MG PO TABS: 10.0000 mg | ORAL_TABLET | Freq: Every day | ORAL | 0 refills | Status: DC

## 2020-10-17 MED ORDER — FLUOXETINE HCL 40 MG PO CAPS: 40.0000 mg | ORAL_CAPSULE | Freq: Every day | ORAL | 1 refills | Status: DC

## 2020-10-17 NOTE — Progress Notes (Signed)
[facility] [place], [place] [address]  Internal MEDICINE  Office Visit Note  Patient Name: Kristine Stephens  [id number]  [id number]  Date of Service: 10/18/2020  Chief Complaint  Patient presents with  . Acute Visit  . Depression    Discuss meds     HPI Pt is here for a sick visit. 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 buPROPion (WELLBUTRIN XL) 150 MG 24 hr tablet Take 1 tablet (150 mg total) by mouth daily.  Marland Kitchen FLUoxetine (PROZAC) 40 MG capsule Take 1 capsule (40 mg total) by mouth daily.  Marland Kitchen amphetamine-dextroamphetamine (ADDERALL) 10 MG tablet Take 1 tablet (10 mg total) by mouth daily with breakfast.  . b complex vitamins capsule Take 1 capsule by mouth daily.  . ergocalciferol (DRISDOL) 1.25 MG (50000 UT) capsule Take 1 capsule (50,000 Units total) by mouth once a week.  . estradiol (ESTRACE VAGINAL) 0.1 MG/GM vaginal cream Place 1 Applicatorful vaginally 3 (three) times a week.  . etanercept (ENBREL) 50 MG/ML injection Inject 50 mg into the skin once a week.  . etodolac (LODINE) 400 MG tablet TK 1 T PO BID  . nitrofurantoin, macrocrystal-monohydrate, (MACROBID) 100 MG capsule Take 1 capsule (100 mg total) by mouth 2 (two) times daily for 5 days.  Marland Kitchen omeprazole (PRILOSEC) 20 MG  capsule Take 1 capsule (20 mg total) by mouth daily.  . [DISCONTINUED] amphetamine-dextroamphetamine (ADDERALL) 10 MG tablet Take 1 tablet (10 mg total) by mouth daily with breakfast.  . [DISCONTINUED] amphetamine-dextroamphetamine (ADDERALL) 10 MG tablet Take 1 tablet (10 mg total) by mouth daily with breakfast.  . [DISCONTINUED] azithromycin (ZITHROMAX Z-PAK) 250 MG tablet Take two 250 mg tablets on day one followed by one 250 mg tablet each day for four days. (Patient not taking: Reported on 10/17/2020)  . [DISCONTINUED] benzonatate (TESSALON) 100 MG capsule Take 1 capsule (100 mg total) by mouth 2 (two) times daily as needed for cough. (Patient not taking: Reported on 10/17/2020)  . [DISCONTINUED] FLUoxetine (PROZAC) 20 MG capsule Take 1 capsule (20 mg total) by mouth daily.  . [DISCONTINUED] predniSONE (DELTASONE) 10 MG tablet Take 1 tablet three times a day with a meal for three for three days, take 1 tablet by twice daily with a meal for 3 days, take 1 tablet once daily with a meal for 3 days (Patient not taking: Reported on 10/17/2020)   No facility-administered encounter medications on file as of 10/17/2020.      Medical History: Past Medical History:  Diagnosis Date  . Anxiety   . Depression   . GERD (gastroesophageal reflux disease)   . Psoriasis      Vital Signs: BP 114/72   Pulse 82   Temp (!) 97.3 F (36.3 C)   Resp 16   Ht 5' (1.524 m)  Wt 172 lb 9.6 oz (78.3 kg)   SpO2 97%   BMI 33.71 kg/m    Review of Systems  Constitutional: Negative for chills, diaphoresis and fatigue.  HENT: Negative for ear pain, postnasal drip and sinus pressure.   Eyes: Negative for photophobia, discharge, redness, itching and visual disturbance.  Respiratory: Negative for cough, shortness of breath and wheezing.   Cardiovascular: Negative for chest pain, palpitations and leg swelling.  Gastrointestinal: Negative for abdominal pain, constipation, diarrhea, nausea and vomiting.   Genitourinary: Negative for dysuria and flank pain.  Musculoskeletal: Negative for arthralgias, back pain, gait problem and neck pain.  Skin: Negative for color change.  Allergic/Immunologic: Negative for environmental allergies and food allergies.  Neurological: Negative for dizziness and headaches.  Hematological: Does not bruise/bleed easily.  Psychiatric/Behavioral: Positive for behavioral problems (depression) and decreased concentration. Negative for agitation and hallucinations. The patient is nervous/anxious.     Physical Exam Vitals reviewed.  Constitutional:      Appearance: Normal appearance. She is normal weight.  Cardiovascular:     Rate and Rhythm: Normal rate and regular rhythm.     Pulses: Normal pulses.     Heart sounds: Normal heart sounds.  Pulmonary:     Effort: Pulmonary effort is normal.     Breath sounds: Normal breath sounds.  Musculoskeletal:        General: Normal range of motion.     Cervical back: Normal range of motion.  Skin:    General: Skin is warm.  Neurological:     General: No focal deficit present.     Mental Status: She is alert and oriented to person, place, and time. Mental status is at baseline.  Psychiatric:        Mood and Affect: Mood normal.        Behavior: Behavior normal.        Thought Content: Thought content normal.        Judgment: Judgment normal.    Assessment/Plan: 1. Episode of recurrent major depressive disorder, unspecified depression episode severity (HCC) Increase dose of Prozac, add Wellbutrin to further control symptoms Encouraged to have labs drawn - FLUoxetine (PROZAC) 40 MG capsule; Take 1 capsule (40 mg total) by mouth daily.  Dispense: 90 capsule; Refill: 1 - buPROPion (WELLBUTRIN XL) 150 MG 24 hr tablet; Take 1 tablet (150 mg total) by mouth daily.  Dispense: 90 tablet; Refill: 0  2. Attention deficit disorder (ADD) in adult Encouraged to continue with as needed use, trial Wellbutrin to help further wean  Adderall dosing - amphetamine-dextroamphetamine (ADDERALL) 10 MG tablet; Take 1 tablet (10 mg total) by mouth daily with breakfast.  Dispense: 30 tablet; Refill: 0  General Counseling: bettyanne dittman understanding of the findings of todays visit and agrees with plan of treatment. I have discussed any further diagnostic evaluation that may be needed or ordered today. We also reviewed her medications today. she has been encouraged to call the office with any questions or concerns that should arise related to todays visit.  Meds ordered this encounter  Medications  . FLUoxetine (PROZAC) 40 MG capsule    Sig: Take 1 capsule (40 mg total) by mouth daily.    Dispense:  90 capsule    Refill:  1  . amphetamine-dextroamphetamine (ADDERALL) 10 MG tablet    Sig: Take 1 tablet (10 mg total) by mouth daily with breakfast.    Dispense:  30 tablet    Refill:  0  . buPROPion (WELLBUTRIN XL) 150 MG 24  hr tablet    Sig: Take 1 tablet (150 mg total) by mouth daily.    Dispense:  90 tablet    Refill:  0    Time spent: 30 Minutes  This patient was seen by Theodoro Grist AGNP-C in Collaboration with Dr Lavera Guise as a part of collaborative care agreement.  Tanna Furry [facility] Internal Medicine

## 2020-10-17 NOTE — Progress Notes (Signed)
Obstetrics & Gynecology Office Visit   Chief Complaint:  Chief Complaint  Patient presents with  . Vaginal Bleeding    Cramping and spotting, frequency urination - RM 4    History of Present Illness: 55 y.o. female status post prior supracervical hysterectomy in 2012 presenting with postcoital bleeding and pelvic pain.  Initially slightly heavier bleeding but self resolved.  No further bleeding.  Does report some continued midline pelvic pain.  Some urinary frequency but no dysuria.  Pap smear is up to date 05/2020 NILM HPV negative.  No associated GI symptoms.   She did interrupt vaginal estrogen therapy and has recently restarted.  Review of Systems: Review of Systems  Constitutional: Negative.   Gastrointestinal: Positive for abdominal pain. Negative for blood in stool, constipation, diarrhea, nausea and vomiting.     Past Medical History:  Past Medical History:  Diagnosis Date  . Anxiety   . Depression   . GERD (gastroesophageal reflux disease)   . Psoriasis     Past Surgical History:  Past Surgical History:  Procedure Laterality Date  . ASD REPAIR  1985  . CARPAL TUNNEL RELEASE    . CESAREAN SECTION    . COLONOSCOPY WITH PROPOFOL N/A 04/11/2017   Procedure: COLONOSCOPY WITH PROPOFOL;  Surgeon: Jonathon Bellows, MD;  Location: [facility] ENDOSCOPY;  Service: Gastroenterology;  Laterality: N/A;  . COLPOSCOPY  2012  . ENDOMETRIAL ABLATION     Failed ablation x2 (Tech difficulties)  . LAPAROSCOPIC VAGINAL HYSTERECTOMY WITH SALPINGO OOPHORECTOMY  2012   Adenomyosis for AUB  . TONSILLECTOMY    . TUBAL LIGATION      Gynecologic History: No LMP recorded. Patient has had a hysterectomy.  Obstetric History: [id number]  Family History:  Family History  Problem Relation Age of Onset  . Breast cancer Paternal Grandmother 15  . Multiple myeloma Father   . Thyroid disease Paternal Aunt     Social History:  Social History   Socioeconomic History  . Marital status: Married     Spouse name: Not on file  . Number of children: Not on file  . Years of education: Not on file  . Highest education level: Not on file  Occupational History  . Not on file  Tobacco Use  . Smoking status: Never Smoker  . Smokeless tobacco: Never Used  Vaping Use  . Vaping Use: Never used  Substance and Sexual Activity  . Alcohol use: No  . Drug use: No  . Sexual activity: Yes    Partners: Male    Birth control/protection: None  Other Topics Concern  . Not on file  Social History Narrative  . Not on file   Social Determinants of Health   Financial Resource Strain: Not on file  Food Insecurity: Not on file  Transportation Needs: Not on file  Physical Activity: Not on file  Stress: Not on file  Social Connections: Not on file  Intimate Partner Violence: Not on file    Allergies:  No Known Allergies  Medications: Prior to Admission medications   Medication Sig Start Date End Date Taking? Authorizing Provider  amphetamine-dextroamphetamine (ADDERALL) 10 MG tablet Take 1 tablet (10 mg total) by mouth daily with breakfast. 09/07/20  Yes Luiz Ochoa, NP  b complex vitamins capsule Take 1 capsule by mouth daily.   Yes [provider]  estradiol (ESTRACE VAGINAL) 0.1 MG/GM vaginal cream Place 1 Applicatorful vaginally 3 (three) times a week. 02/08/20  Yes Malachy Mood, MD  etanercept (ENBREL)  50 MG/ML injection Inject 50 mg into the skin once a week. 08/27/18  Yes [provider]  FLUoxetine (PROZAC) 20 MG capsule Take 1 capsule (20 mg total) by mouth daily. 07/06/20  Yes Boscia, Greer Ee, NP  omeprazole (PRILOSEC) 20 MG capsule Take 1 capsule (20 mg total) by mouth daily. 12/15/19  Yes Ronnell Freshwater, NP  amphetamine-dextroamphetamine (ADDERALL) 10 MG tablet Take 1 tablet (10 mg total) by mouth daily with breakfast. 09/07/20   Luiz Ochoa, NP  azithromycin (ZITHROMAX Z-PAK) 250 MG tablet Take two 250 mg tablets on day one followed by one 250 mg tablet  each day for four days. Patient not taking: Reported on 10/17/2020 09/07/20   Luiz Ochoa, NP  benzonatate (TESSALON) 100 MG capsule Take 1 capsule (100 mg total) by mouth 2 (two) times daily as needed for cough. Patient not taking: Reported on 10/17/2020 09/07/20   Luiz Ochoa, NP  ergocalciferol (DRISDOL) 1.25 MG (50000 UT) capsule Take 1 capsule (50,000 Units total) by mouth once a week. 07/06/20   Ronnell Freshwater, NP  etodolac (LODINE) 400 MG tablet TK 1 T PO BID 07/07/20   Luiz Ochoa, NP  predniSONE (DELTASONE) 10 MG tablet Take 1 tablet three times a day with a meal for three for three days, take 1 tablet by twice daily with a meal for 3 days, take 1 tablet once daily with a meal for 3 days Patient not taking: Reported on 10/17/2020 09/07/20   Luiz Ochoa, NP    Physical Exam Vitals:  Vitals:   10/17/20 0934  BP: 122/86   No LMP recorded. Patient has had a hysterectomy.  General: NAD HEENT: normocephalic, anicteric Pulmonary: No increased work of breathing Genitourinary:  External: Normal external female genitalia.  Normal urethral meatus, normal Bartholin's and Skene's glands.    Vagina: Normal vaginal mucosa, no evidence of prolapse.  No evidence of vaginal lacerations  Cervix: Grossly normal in appearance, no bleeding  Uterus: surgically absent  Adnexa: ovaries non-enlarged, no adnexal masses  Rectal: deferred  Lymphatic: no evidence of inguinal lymphadenopathy Extremities: no edema, erythema, or tenderness Neurologic: Grossly intact Psychiatric: mood appropriate, affect full  Female chaperone present for pelvic  portions of the physical exam  Assessment: 55 y.o. [id number] with postcoital bleeding, apparent UTI  Plan: Problem List Items Addressed This Visit   None   Visit Diagnoses    Frequency of urination    -  Primary   Relevant Orders   POCT Urinalysis Dipstick (Completed)   NuSwab BV and Candida, NAA   Vaginal bleeding       Leukorrhea        Relevant Orders   Urine Culture   Vaginitis affecting pregnancy, antepartum       Relevant Orders   Urine Culture   Postcoital and contact bleeding       Relevant Orders   Urine Culture   Abnormal urinalysis       Relevant Orders   NuSwab BV and Candida, NAA     1) Midline cramping  - Rx macrobid - UCx  2) Postcoital bleeding - has started back on estrace cream - no evidence of lacerations or bleeding - normal pap 05/16/2021  3) A total of 15 minutes were spent in face-to-face contact with the patient during this encounter with over half of that time devoted to counseling and coordination of care.  4) Return in about 7 months (around 05/19/2021) for annual.  Malachy Mood, MD, [facility], [facility] 10/17/2020, 10:04 AM

## 2020-10-18 ENCOUNTER — Encounter: Payer: Self-pay | Admitting: Hospice and Palliative Medicine

## 2020-10-19 LAB — CBC WITH DIFFERENTIAL/PLATELET
Basophils Absolute: 0 10*3/uL (ref 0.0–0.2)
Basos: 1 %
EOS (ABSOLUTE): 0.1 10*3/uL (ref 0.0–0.4)
Eos: 2 %
Hematocrit: 36.6 % (ref 34.0–46.6)
Hemoglobin: 12 g/dL (ref 11.1–15.9)
Immature Grans (Abs): 0 10*3/uL (ref 0.0–0.1)
Immature Granulocytes: 0 %
Lymphocytes Absolute: 1.4 10*3/uL (ref 0.7–3.1)
Lymphs: 32 %
MCH: 26.3 pg — ABNORMAL LOW (ref 26.6–33.0)
MCHC: 32.8 g/dL (ref 31.5–35.7)
MCV: 80 fL (ref 79–97)
Monocytes Absolute: 0.4 10*3/uL (ref 0.1–0.9)
Monocytes: 8 %
Neutrophils Absolute: 2.6 10*3/uL (ref 1.4–7.0)
Neutrophils: 57 %
Platelets: 238 10*3/uL (ref 150–450)
RBC: 4.57 x10E6/uL (ref 3.77–5.28)
RDW: 14.3 % (ref 11.7–15.4)
WBC: 4.5 10*3/uL (ref 3.4–10.8)

## 2020-10-19 LAB — COMPREHENSIVE METABOLIC PANEL
ALT: 14 IU/L (ref 0–32)
AST: 22 IU/L (ref 0–40)
Albumin/Globulin Ratio: 1.6 (ref 1.2–2.2)
Albumin: 4.4 g/dL (ref 3.8–4.9)
Alkaline Phosphatase: 99 IU/L (ref 44–121)
BUN/Creatinine Ratio: 21 (ref 9–23)
BUN: 14 mg/dL (ref 6–24)
Bilirubin Total: 0.3 mg/dL (ref 0.0–1.2)
CO2: 23 mmol/L (ref 20–29)
Calcium: 9.1 mg/dL (ref 8.7–10.2)
Chloride: 98 mmol/L (ref 96–106)
Creatinine, Ser: 0.68 mg/dL (ref 0.57–1.00)
Globulin, Total: 2.8 g/dL (ref 1.5–4.5)
Glucose: 123 mg/dL — ABNORMAL HIGH (ref 65–99)
Potassium: 4.5 mmol/L (ref 3.5–5.2)
Sodium: 140 mmol/L (ref 134–144)
Total Protein: 7.2 g/dL (ref 6.0–8.5)
eGFR: 103 mL/min/{1.73_m2} (ref >59)

## 2020-10-19 LAB — NUSWAB BV AND CANDIDA, NAA
Candida albicans, NAA: NEGATIVE
Candida glabrata, NAA: NEGATIVE

## 2020-10-19 LAB — LIPID PANEL WITH LDL/HDL RATIO
Cholesterol, Total: 248 mg/dL — ABNORMAL HIGH (ref 100–199)
HDL: 40 mg/dL (ref >39)
LDL Chol Calc (NIH): 160 mg/dL — ABNORMAL HIGH (ref 0–99)
LDL/HDL Ratio: 4 ratio — ABNORMAL HIGH (ref 0.0–3.2)
Triglycerides: 259 mg/dL — ABNORMAL HIGH (ref 0–149)
VLDL Cholesterol Cal: 48 mg/dL — ABNORMAL HIGH (ref 5–40)

## 2020-10-19 LAB — TSH+FREE T4
Free T4: 0.94 ng/dL (ref 0.82–1.77)
TSH: 3.51 u[IU]/mL (ref 0.450–4.500)

## 2020-10-19 LAB — VITAMIN B12: Vitamin B-12: 712 pg/mL (ref 232–1245)

## 2020-10-19 LAB — URINE CULTURE

## 2020-10-19 LAB — VITAMIN D 25 HYDROXY (VIT D DEFICIENCY, FRACTURES): Vit D, 25-Hydroxy: 36.6 ng/mL (ref 30.0–100.0)

## 2020-10-19 NOTE — Progress Notes (Signed)
Labs reviewed, will be discussed at next visit.

## 2020-10-25 ENCOUNTER — Telehealth: Payer: Self-pay

## 2020-10-25 NOTE — Telephone Encounter (Signed)
Patient calling stating she has completed her course of Macrobid and is still having some mild symptoms  of urgency. 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 I advised patient per AMS message 10/19/20 not much bacteria growth and if she continued to have symptoms that she may need an ultrasound, also AMS is not in the office this week. Patient stated that she would continue to monitor her symptoms and call us back if she does not see any improvement. Drl, cma(aama)

## 2020-11-11 ENCOUNTER — Other Ambulatory Visit: Payer: Self-pay

## 2020-11-11 DIAGNOSIS — F339 Major depressive disorder, recurrent, unspecified: Secondary | ICD-10-CM

## 2020-11-11 MED ORDER — BUPROPION HCL ER (XL) 150 MG PO TB24: 150.0000 mg | ORAL_TABLET | Freq: Every day | ORAL | 0 refills | Status: DC

## 2020-11-11 MED ORDER — FLUOXETINE HCL 40 MG PO CAPS: 40.0000 mg | ORAL_CAPSULE | Freq: Every day | ORAL | 1 refills | Status: DC

## 2020-11-11 NOTE — Telephone Encounter (Signed)
Spoke with pt she using mailed order phar is [facility] script

## 2020-11-15 ENCOUNTER — Other Ambulatory Visit: Payer: Self-pay

## 2020-11-15 ENCOUNTER — Encounter: Payer: Self-pay | Admitting: Nurse Practitioner

## 2020-11-15 ENCOUNTER — Ambulatory Visit: Payer: Managed Care, Other (non HMO) | Admitting: Nurse Practitioner

## 2020-11-15 DIAGNOSIS — Z79899 Other long term (current) drug therapy: Secondary | ICD-10-CM | POA: Diagnosis not present

## 2020-11-15 DIAGNOSIS — R7303 Prediabetes: Secondary | ICD-10-CM

## 2020-11-15 DIAGNOSIS — F339 Major depressive disorder, recurrent, unspecified: Secondary | ICD-10-CM | POA: Diagnosis not present

## 2020-11-15 DIAGNOSIS — F411 Generalized anxiety disorder: Secondary | ICD-10-CM

## 2020-11-15 DIAGNOSIS — F988 Other specified behavioral and emotional disorders with onset usually occurring in childhood and adolescence: Secondary | ICD-10-CM

## 2020-11-15 DIAGNOSIS — E782 Mixed hyperlipidemia: Secondary | ICD-10-CM

## 2020-11-15 LAB — POCT URINE DRUG SCREEN
Methylenedioxyamphetamine: NOT DETECTED
POC Amphetamine UR: NOT DETECTED
POC BENZODIAZEPINES UR: NOT DETECTED
POC Barbiturate UR: NOT DETECTED
POC Cocaine UR: NOT DETECTED
POC Ecstasy UR: NOT DETECTED
POC Marijuana UR: NOT DETECTED
POC Methadone UR: NOT DETECTED
POC Methamphetamine UR: NOT DETECTED
POC Opiate Ur: NOT DETECTED
POC Oxycodone UR: NOT DETECTED
POC PHENCYCLIDINE UR: NOT DETECTED
POC TRICYCLICS UR: NOT DETECTED

## 2020-11-15 LAB — POCT GLYCOSYLATED HEMOGLOBIN (HGB A1C): Hemoglobin A1C: 5.9 % — AB (ref 4.0–5.6)

## 2020-11-15 MED ORDER — BUPROPION HCL ER (XL) 150 MG PO TB24: 150.0000 mg | ORAL_TABLET | Freq: Every day | ORAL | 0 refills | Status: DC

## 2020-11-15 NOTE — Progress Notes (Signed)
[facility] [place], [place] [address]  Internal MEDICINE  Office Visit Note  Patient Name: Kristine Stephens  [id number]  [id number]  Date of Service: 11/15/2020  Chief Complaint  Patient presents with  . Follow-up    Refill request   . Depression  . Anxiety  . Gastroesophageal Reflux    HPI Pt presents for follow up Depressed mood and anxiety improving with wellbutrin and fluoxetine wellbutrin can treat adhd, adhd well controlled Last a1c prediabetic 6.1, recheck today Last 3 glucose on cmp elevated Review labs with patient from 10/18/20. Pt has no other concerns at this time.    Current Medication: Outpatient Encounter Medications as of 11/15/2020  Medication Sig  . b complex vitamins capsule Take 1 capsule by mouth daily.  Marland Kitchen estradiol (ESTRACE VAGINAL) 0.1 MG/GM vaginal cream Place 1 Applicatorful vaginally 3 (three) times a week.  . etanercept (ENBREL) 50 MG/ML injection Inject 50 mg into the skin once a week.  . etodolac (LODINE) 400 MG tablet TK 1 T PO BID  . FLUoxetine (PROZAC) 40 MG capsule Take 1 capsule (40 mg total) by mouth daily.  Marland Kitchen omeprazole (PRILOSEC) 20 MG capsule Take 1 capsule (20 mg total) by mouth daily.  . [DISCONTINUED] amphetamine-dextroamphetamine (ADDERALL) 10 MG tablet Take 1 tablet (10 mg total) by mouth daily with breakfast.  . [DISCONTINUED] buPROPion (WELLBUTRIN XL) 150 MG 24 hr tablet Take 1 tablet (150 mg total) by mouth daily.  Marland Kitchen buPROPion (WELLBUTRIN XL) 150 MG 24 hr tablet Take 1 tablet (150 mg total) by mouth daily.  . ergocalciferol (DRISDOL) 1.25 MG (50000 UT) capsule Take 1 capsule (50,000 Units total) by mouth once a week. (Patient not taking: Reported on 11/15/2020)   No facility-administered encounter medications on file as of 11/15/2020.    Surgical History: Past Surgical History:  Procedure Laterality Date  . ASD REPAIR  1985  . CARPAL TUNNEL RELEASE    . CESAREAN SECTION    . COLONOSCOPY WITH PROPOFOL  N/A 04/11/2017   Procedure: COLONOSCOPY WITH PROPOFOL;  Surgeon: Jonathon Bellows, MD;  Location: [facility] ENDOSCOPY;  Service: Gastroenterology;  Laterality: N/A;  . COLPOSCOPY  2012  . ENDOMETRIAL ABLATION     Failed ablation x2 (Tech difficulties)  . LAPAROSCOPIC VAGINAL HYSTERECTOMY WITH SALPINGO OOPHORECTOMY  2012   Adenomyosis for AUB  . TONSILLECTOMY    . TUBAL LIGATION      Medical History: Past Medical History:  Diagnosis Date  . Anxiety   . Depression   . GERD (gastroesophageal reflux disease)   . Psoriasis     Family History: Family History  Problem Relation Age of Onset  . Breast cancer Paternal Grandmother 82  . Multiple myeloma Father   . Thyroid disease Paternal Aunt     Social History   Socioeconomic History  . Marital status: Married    Spouse name: Not on file  . Number of children: Not on file  . Years of education: Not on file  . Highest education level: Not on file  Occupational History  . Not on file  Tobacco Use  . Smoking status: Never Smoker  . Smokeless tobacco: Never Used  Vaping Use  . Vaping Use: Never used  Substance and Sexual Activity  . Alcohol use: No  . Drug use: No  . Sexual activity: Yes    Partners: Male    Birth control/protection: None  Other Topics Concern  . Not on file  Social History Narrative  . Not  on file   Social Determinants of Health   Financial Resource Strain: Not on file  Food Insecurity: Not on file  Transportation Needs: Not on file  Physical Activity: Not on file  Stress: Not on file  Social Connections: Not on file  Intimate Partner Violence: Not on file      Review of Systems  Constitutional: Negative.   HENT: Negative.   Eyes: Negative.   Respiratory: Negative.   Cardiovascular: Negative.   Gastrointestinal: Negative.   Endocrine: Negative.   Genitourinary: Negative.   Psychiatric/Behavioral: The patient is nervous/anxious.        Pt states anxiety is improving and describes her anxiety as  low today and reports improvement in depressed mood. Reports she is able to not let small things bother her.     Vital Signs: BP 120/76   Pulse 95   Temp (!) 97.5 F (36.4 C)   Resp 16   Ht 5' (1.524 m)   Wt 170 lb 6.4 oz (77.3 kg)   SpO2 98%   BMI 33.28 kg/m    Physical Exam Constitutional:      Appearance: Normal appearance. She is obese.  HENT:     Head: Normocephalic and atraumatic.  Cardiovascular:     Rate and Rhythm: Normal rate and regular rhythm.     Pulses: Normal pulses.     Heart sounds: Normal heart sounds.  Pulmonary:     Effort: Pulmonary effort is normal.     Breath sounds: Normal breath sounds.  Musculoskeletal:     Cervical back: Normal range of motion and neck supple.  Skin:    General: Skin is warm and dry.     Capillary Refill: Capillary refill takes less than 2 seconds.  Neurological:     General: No focal deficit present.     Mental Status: She is alert and oriented to person, place, and time.  Psychiatric:     Comments: Pleasant mood and affect today, cooperative during visit and engages in conversation. No fidgeting noted. Talks at an appropriate pace, not rushed. Keeps attention on topic and maintains eye contact         Assessment/Plan:  1. Prediabetes Blood glucose levels on the past 3 metabolic panels have been elevated. A1c in December 2020 was 6.1. no recorded A1c values since that date. Today her a1c was 5.9 which is still in the prediabetic range. This value is an improvement from the past a1c values. No medications were started for diabetes today.  Diet and exercise counseling with Mamye: patient encouraged to engage in 30 minutes of moderate exercise at least 5 days a week. A 1500 calorie diet information sheet was provided to the patient.   - POCT HgB A1C  2. Mixed hyperlipidemia Lipid panel as seen below. All levels are elevated except HDL. ASCVD 10-year risk is 5.4%. will manage with diet and exercise and continue to monitor  lipid levels and ASCVD risk periodically every 4-6 months. Diet and exercise counseling provided as discussed under problem #1.  Lipid Panel     Component Value Date/Time   CHOL 248 (H) 10/18/2020 0813   TRIG 259 (H) 10/18/2020 0813   HDL 40 10/18/2020 0813   LDLCALC 160 (H) 10/18/2020 0813   LABVLDL 48 (H) 10/18/2020 0813     3. Episode of recurrent major depressive disorder, unspecified depression episode severity ([place]) Pt started on wellbutrin at previous visit. This medication has been working well for [employer] for depression.  - buPROPion [facility]  XL) 150 MG 24 hr tablet; Take 1 tablet (150 mg total) by mouth daily.  Dispense: 30 tablet; Refill: 0  4. GAD (generalized anxiety disorder) Pt is taking wellbutrin for depression. This medication in combination with fluoxetine is also helping with her anxiety.  The risk of wellbutrin worsening the patient's anxiety was discussed because this is a possible side effect of this medication. Her anxiety is low per pt report. Pt instructed to call the clinic if she notices her anxiety is worsening and the medication can be switched to an alternative.   5. Attention deficit disorder (ADD) in adult Blondine was taking short acting adderall for ADHD. Stimulant medications can increase anxiety which was discussed with the patient. wellbutrin is approved for the treatment of ADHD in adults. adderall was discontinued.   6. Encounter for long-term (current) use of medications adderall was discontinued. The patient was not taking it consistently and it may increase her anxiety. UDS negative - POCT Urine Drug Screen   General Counseling: Kaleeya verbalizes understanding of the findings of todays visit and agrees with plan of treatment. I have discussed any further diagnostic evaluation that may be needed or ordered today. We also reviewed her medications today. she has been encouraged to call the office with any questions or concerns that should arise related  to todays visit.    Orders Placed This Encounter  Procedures  . POCT Urine Drug Screen  . POCT HgB A1C    Meds ordered this encounter  Medications  . buPROPion (WELLBUTRIN XL) 150 MG 24 hr tablet    Sig: Take 1 tablet (150 mg total) by mouth daily.    Dispense:  30 tablet    Refill:  0    Total time spent: 30 Minutes Time spent includes review of chart, medications, test results, and follow up plan with the patient.   [place] Controlled Substance Database was reviewed by me.   Dr Lavera Guise Internal medicine

## 2020-11-29 ENCOUNTER — Ambulatory Visit: Payer: Managed Care, Other (non HMO) | Admitting: Internal Medicine

## 2020-11-29 ENCOUNTER — Encounter: Payer: Self-pay | Admitting: Internal Medicine

## 2020-11-29 DIAGNOSIS — J0141 Acute recurrent pansinusitis: Secondary | ICD-10-CM

## 2020-11-29 DIAGNOSIS — R059 Cough, unspecified: Secondary | ICD-10-CM

## 2020-11-29 DIAGNOSIS — E2839 Other primary ovarian failure: Secondary | ICD-10-CM

## 2020-11-29 LAB — POC SOFIA 2 FLU + SARS ANTIGEN FIA: SARS Coronavirus 2 Ag: NEGATIVE

## 2020-11-29 MED ORDER — METHYLPREDNISOLONE ACETATE 80 MG/ML IJ SUSP
40.0000 mg | Freq: Once | INTRAMUSCULAR | Status: AC
  Administered 2020-11-29: 40 mg via INTRAMUSCULAR

## 2020-11-29 MED ORDER — AMOXICILLIN-POT CLAVULANATE 875-125 MG PO TABS
1.0000 | ORAL_TABLET | Freq: Two times a day (BID) | ORAL | 0 refills | Status: DC
Start: 2020-11-29 — End: 2020-12-26

## 2020-11-29 NOTE — Progress Notes (Signed)
[facility] [place], [place] [address]  Internal MEDICINE  Office Visit Note  Patient Name: Kristine Stephens  [id number]  [id number]  Date of Service: 11/29/2020  Chief Complaint  Patient presents with  . Acute Visit    Sinus congestion, cough, tired, headache,ear pain    HPI  Patient is here for acute and sick visit it started as cough headache and earache.  The symptoms are improving however she does have severe sinus congestion.  Dry nose and getting very tired.  She denies any fever or chills there is extreme sinus pressure and this is not improving   Current Medication: Outpatient Encounter Medications as of 11/29/2020  Medication Sig  . amoxicillin-clavulanate (AUGMENTIN) 875-125 MG tablet Take 1 tablet by mouth 2 (two) times daily. With food  . b complex vitamins capsule Take 1 capsule by mouth daily.  Marland Kitchen buPROPion (WELLBUTRIN XL) 150 MG 24 hr tablet Take 1 tablet (150 mg total) by mouth daily.  . ergocalciferol (DRISDOL) 1.25 MG (50000 UT) capsule Take 1 capsule (50,000 Units total) by mouth once a week.  . estradiol (ESTRACE VAGINAL) 0.1 MG/GM vaginal cream Place 1 Applicatorful vaginally 3 (three) times a week.  . etanercept (ENBREL) 50 MG/ML injection Inject 50 mg into the skin once a week.  . etodolac (LODINE) 400 MG tablet TK 1 T PO BID  . FLUoxetine (PROZAC) 40 MG capsule Take 1 capsule (40 mg total) by mouth daily.  Marland Kitchen omeprazole (PRILOSEC) 20 MG capsule Take 1 capsule (20 mg total) by mouth daily.  . [EXPIRED] methylPREDNISolone acetate (DEPO-MEDROL) injection 40 mg    No facility-administered encounter medications on file as of 11/29/2020.    Surgical History: Past Surgical History:  Procedure Laterality Date  . ASD REPAIR  1985  . CARPAL TUNNEL RELEASE    . CESAREAN SECTION    . COLONOSCOPY WITH PROPOFOL N/A 04/11/2017   Procedure: COLONOSCOPY WITH PROPOFOL;  Surgeon: Jonathon Bellows, MD;  Location: [facility] ENDOSCOPY;  Service:  Gastroenterology;  Laterality: N/A;  . COLPOSCOPY  2012  . ENDOMETRIAL ABLATION     Failed ablation x2 (Tech difficulties)  . LAPAROSCOPIC VAGINAL HYSTERECTOMY WITH SALPINGO OOPHORECTOMY  2012   Adenomyosis for AUB  . TONSILLECTOMY    . TUBAL LIGATION      Medical History: Past Medical History:  Diagnosis Date  . Anxiety   . Depression   . GERD (gastroesophageal reflux disease)   . Psoriasis     Family History: Family History  Problem Relation Age of Onset  . Breast cancer Paternal Grandmother 36  . Multiple myeloma Father   . Thyroid disease Paternal Aunt     Social History   Socioeconomic History  . Marital status: Married    Spouse name: Not on file  . Number of children: Not on file  . Years of education: Not on file  . Highest education level: Not on file  Occupational History  . Not on file  Tobacco Use  . Smoking status: Never Smoker  . Smokeless tobacco: Never Used  Vaping Use  . Vaping Use: Never used  Substance and Sexual Activity  . Alcohol use: No  . Drug use: No  . Sexual activity: Yes    Partners: Male    Birth control/protection: None  Other Topics Concern  . Not on file  Social History Narrative  . Not on file   Social Determinants of Health   Financial Resource Strain: Not on file  Food Insecurity:  Not on file  Transportation Needs: Not on file  Physical Activity: Not on file  Stress: Not on file  Social Connections: Not on file  Intimate Partner Violence: Not on file      Review of Systems  Constitutional: Negative for chills, diaphoresis and fatigue.  HENT: Positive for postnasal drip and sinus pressure. Negative for ear pain.   Eyes: Negative for photophobia, discharge, redness, itching and visual disturbance.  Respiratory: Negative for cough, shortness of breath and wheezing.   Genitourinary: Negative for dysuria and flank pain.  Skin: Negative for color change.  Neurological: Positive for headaches. Negative for dizziness.   Psychiatric/Behavioral: Negative for agitation, behavioral problems (depression) and hallucinations.    Vital Signs: BP 128/84   Pulse 77   Temp 98.6 F (37 C)   Resp 16   Ht $R'5\' 5"'pq$  (1.651 m)   Wt 170 lb 3.2 oz (77.2 kg)   SpO2 97%   BMI 28.32 kg/m    Physical Exam Constitutional:      Appearance: Normal appearance.  HENT:     Head: Normocephalic and atraumatic.     Mouth/Throat:     Pharynx: No oropharyngeal exudate.  Cardiovascular:     Rate and Rhythm: Normal rate and regular rhythm.  Pulmonary:     Effort: Pulmonary effort is normal.     Breath sounds: Normal breath sounds.  Neurological:     Mental Status: She is alert.        Assessment/Plan: 1. Acute recurrent pansinusitis Severe sinus congestion patient is negative for flu and COVID.  We will start Augmentin as prescribed.  If no improvement seen she might need CT of the sinuses with no contrast - amoxicillin-clavulanate (AUGMENTIN) 875-125 MG tablet; Take 1 tablet by mouth 2 (two) times daily. With food  Dispense: 14 tablet; Refill: 0  2. Cough This is most likely due to postnasal drip and sinus congestion patient is given Depo-Medrol injection IM 40 mg to relieve her symptoms of severe sinus congestion - POC SOFIA 2 FLU + SARS ANTIGEN FIA - methylPREDNISolone acetate (DEPO-MEDROL) injection 40 mg  3. Other primary ovarian failure Patient had never had a bone density done we will order today patient is menopausal - DG Bone Density; Future  General Counseling: sherra kimmons understanding of the findings of todays visit and agrees with plan of treatment. I have discussed any further diagnostic evaluation that may be needed or ordered today. We also reviewed her medications today. she has been encouraged to call the office with any questions or concerns that should arise related to todays visit.    Orders Placed This Encounter  Procedures  . DG Bone Density  . POC SOFIA 2 FLU + SARS ANTIGEN FIA     Meds ordered this encounter  Medications  . amoxicillin-clavulanate (AUGMENTIN) 875-125 MG tablet    Sig: Take 1 tablet by mouth 2 (two) times daily. With food    Dispense:  14 tablet    Refill:  0  . methylPREDNISolone acetate (DEPO-MEDROL) injection 40 mg    Total time spent:30 Minutes Time spent includes review of chart, medications, test results, and follow up plan with the patient.   [place] Controlled Substance Database was reviewed by me.   Dr Lavera Guise Internal medicine

## 2020-11-30 ENCOUNTER — Ambulatory Visit: Payer: Managed Care, Other (non HMO) | Admitting: Internal Medicine

## 2020-12-06 ENCOUNTER — Ambulatory Visit: Payer: Managed Care, Other (non HMO) | Admitting: Nurse Practitioner

## 2020-12-08 ENCOUNTER — Encounter: Payer: Managed Care, Other (non HMO) | Admitting: Physician Assistant

## 2020-12-11 ENCOUNTER — Other Ambulatory Visit: Payer: Self-pay | Admitting: Obstetrics and Gynecology

## 2020-12-22 ENCOUNTER — Telehealth: Payer: Self-pay

## 2020-12-22 NOTE — Telephone Encounter (Signed)
Left vm regarding 3:30 arrival for 12/26/20 appointment-Toni

## 2020-12-26 ENCOUNTER — Ambulatory Visit (INDEPENDENT_AMBULATORY_CARE_PROVIDER_SITE_OTHER): Payer: Managed Care, Other (non HMO) | Admitting: Physician Assistant

## 2020-12-26 ENCOUNTER — Other Ambulatory Visit: Payer: Self-pay

## 2020-12-26 ENCOUNTER — Encounter: Payer: Self-pay | Admitting: Physician Assistant

## 2020-12-26 DIAGNOSIS — F411 Generalized anxiety disorder: Secondary | ICD-10-CM | POA: Diagnosis not present

## 2020-12-26 DIAGNOSIS — E66811 Obesity, class 1: Secondary | ICD-10-CM

## 2020-12-26 DIAGNOSIS — Z0001 Encounter for general adult medical examination with abnormal findings: Secondary | ICD-10-CM | POA: Diagnosis not present

## 2020-12-26 DIAGNOSIS — E782 Mixed hyperlipidemia: Secondary | ICD-10-CM

## 2020-12-26 DIAGNOSIS — R7303 Prediabetes: Secondary | ICD-10-CM | POA: Diagnosis not present

## 2020-12-26 DIAGNOSIS — E669 Obesity, unspecified: Secondary | ICD-10-CM

## 2020-12-26 DIAGNOSIS — F339 Major depressive disorder, recurrent, unspecified: Secondary | ICD-10-CM | POA: Diagnosis not present

## 2020-12-26 DIAGNOSIS — R300 Dysuria: Secondary | ICD-10-CM

## 2020-12-26 MED ORDER — ROSUVASTATIN CALCIUM 5 MG PO TABS: 5.0000 mg | ORAL_TABLET | Freq: Every day | ORAL | 3 refills | Status: DC

## 2020-12-26 NOTE — Progress Notes (Signed)
[facility] [place], [place] [address]  Internal MEDICINE  Office Visit Note  Patient Name: Kristine Stephens  [id number]  [id number]  Date of Service: 12/27/2020  Chief Complaint  Patient presents with   Annual Exam    Weight loss, Amberen unable to add to med list   Depression   Gastroesophageal Reflux   Anxiety   Quality Metric Gaps    Pneumovax, shingrix, covid booster     HPI Pt is here for routine health maintenance examination -Up to date mammogram in December, pap and breast exam done in November via OBGYN, colonoscopy was in 2018 -Works in [place] lost 2 lbs since visit in May, She is working on diet -Watches grandchildren and chases after them-1 yo and 78yo; admits she could increase her activity level and exercise -goes to bed 11-11:30 and wakes up 6:30. Does snore, but no gasping, or choking. Is sleepy during the daytime, but reports she could increase sleep time. -Reports her anxiety and depression have improved on wellbutrin and prozac. Further discussed that Wellbutrin can aid in weight loss. -Pt would be interested in metabolic test next visit -Discussed using My Fitness pal app/calorie tracker to help with diet planning and to focus on portion control with avoidance of sweets and sugary foods/drinks. Pt also educated to avoid fried/fatty foods given elevated cholesterol. Will go ahead and start on crestor  Current Medication: Outpatient Encounter Medications as of 12/26/2020  Medication Sig   b complex vitamins capsule Take 1 capsule by mouth daily.   buPROPion (WELLBUTRIN XL) 150 MG 24 hr tablet Take 1 tablet (150 mg total) by mouth daily.   ergocalciferol (DRISDOL) 1.25 MG (50000 UT) capsule Take 1 capsule (50,000 Units total) by mouth once a week.   estradiol (ESTRACE) 0.1 MG/GM vaginal cream INSERT 1 APPLICATORFUL VAGINALLY 3 TIMES A WEEK   etanercept (ENBREL) 50 MG/ML injection Inject 50 mg into the skin once a week.   etodolac  (LODINE) 400 MG tablet TK 1 T PO BID   FLUoxetine (PROZAC) 40 MG capsule Take 1 capsule (40 mg total) by mouth daily.   omeprazole (PRILOSEC) 20 MG capsule Take 1 capsule (20 mg total) by mouth daily.   rosuvastatin (CRESTOR) 5 MG tablet Take 1 tablet (5 mg total) by mouth daily.   [DISCONTINUED] amoxicillin-clavulanate (AUGMENTIN) 875-125 MG tablet Take 1 tablet by mouth 2 (two) times daily. With food (Patient not taking: Reported on 12/26/2020)   No facility-administered encounter medications on file as of 12/26/2020.    Surgical History: Past Surgical History:  Procedure Laterality Date   ASD [place]     COLONOSCOPY WITH PROPOFOL N/A 04/11/2017   Procedure: COLONOSCOPY WITH PROPOFOL;  Surgeon: Jonathon Bellows, MD;  Location: [facility] ENDOSCOPY;  Service: Gastroenterology;  Laterality: N/A;   COLPOSCOPY  2012   ENDOMETRIAL ABLATION     Failed ablation x2 (Tech difficulties)   LAPAROSCOPIC VAGINAL HYSTERECTOMY WITH SALPINGO OOPHORECTOMY  2012   Adenomyosis for AUB   TONSILLECTOMY     TUBAL LIGATION      Medical History: Past Medical History:  Diagnosis Date   Anxiety    Depression    GERD (gastroesophageal reflux disease)    Psoriasis     Family History: Family History  Problem Relation Age of Onset   Breast cancer Paternal Grandmother 60   Multiple myeloma Father    Thyroid disease Paternal Aunt  Review of Systems  Constitutional:  Positive for fatigue. Negative for chills and unexpected weight change.  HENT:  Negative for congestion, postnasal drip, rhinorrhea, sneezing and sore throat.   Eyes:  Negative for redness.  Respiratory:  Negative for cough, chest tightness and shortness of breath.   Cardiovascular:  Negative for chest pain and palpitations.  Gastrointestinal:  Negative for abdominal pain, constipation, diarrhea, nausea and vomiting.  Genitourinary:  Negative for dysuria and frequency.  Musculoskeletal:   Negative for arthralgias, back pain, joint swelling and neck pain.  Skin:  Negative for rash.  Neurological: Negative.  Negative for tremors and numbness.  Hematological:  Negative for adenopathy. Does not bruise/bleed easily.  Psychiatric/Behavioral:  Positive for behavioral problems (Depression). Negative for sleep disturbance and suicidal ideas. The patient is nervous/anxious.     Vital Signs: BP 110/80   Pulse 85   Temp (!) 97.2 F (36.2 C)   Resp 16   Ht 5' (1.524 m)   Wt 168 lb 12.8 oz (76.6 kg)   SpO2 98%   BMI 32.97 kg/m    Physical Exam Vitals and nursing note reviewed.  Constitutional:      General: She is not in acute distress.    Appearance: She is well-developed. She is obese. She is not diaphoretic.  HENT:     Head: Normocephalic and atraumatic.     Right Ear: External ear normal.     Left Ear: External ear normal.     Nose: Nose normal.     Mouth/Throat:     Pharynx: No oropharyngeal exudate.  Eyes:     General: No scleral icterus.       Right eye: No discharge.        Left eye: No discharge.     Conjunctiva/sclera: Conjunctivae normal.     Pupils: Pupils are equal, round, and reactive to light.  Neck:     Thyroid: No thyromegaly.     Vascular: No JVD.     Trachea: No tracheal deviation.  Cardiovascular:     Rate and Rhythm: Normal rate and regular rhythm.     Heart sounds: Normal heart sounds. No murmur heard.   No friction rub. No gallop.  Pulmonary:     Effort: Pulmonary effort is normal. No respiratory distress.     Breath sounds: Normal breath sounds. No stridor. No wheezing or rales.  Chest:     Chest wall: No tenderness.  Abdominal:     General: Bowel sounds are normal. There is no distension.     Palpations: Abdomen is soft. There is no mass.     Tenderness: There is no abdominal tenderness. There is no guarding or rebound.  Musculoskeletal:        General: No tenderness or deformity. Normal range of motion.     Cervical back: Normal  range of motion and neck supple.  Lymphadenopathy:     Cervical: No cervical adenopathy.  Skin:    General: Skin is warm and dry.     Coloration: Skin is not pale.     Findings: No erythema or rash.  Neurological:     Mental Status: She is alert.     Cranial Nerves: No cranial nerve deficit.     Motor: No abnormal muscle tone.     Coordination: Coordination normal.     Deep Tendon Reflexes: Reflexes are normal and symmetric.  Psychiatric:        Behavior: Behavior normal.        Thought  Content: Thought content normal.        Judgment: Judgment normal.     LABS: Recent Results (from the past 2160 hour(s))  POCT Urinalysis Dipstick     Status: Abnormal   Collection Time: 10/17/20  9:49 AM  Result Value Ref Range   Color, UA     Clarity, UA     Glucose, UA Positive (A) Negative   Bilirubin, UA Negative    Ketones, UA Negative    Spec Grav, UA 1.010 1.010 - 1.025   Blood, UA Negative    pH, UA 7.0 5.0 - 8.0   Protein, UA Positive (A) Negative   Urobilinogen, UA 2.0 (A) 0.2 or 1.0 E.U./dL   Nitrite, UA Negative    Leukocytes, UA Moderate (2+) (A) Negative   Appearance Clear    Odor No   Urine Culture     Status: None   Collection Time: 10/17/20 12:12 PM   Specimen: Urine   UR  Result Value Ref Range   Urine Culture, Routine Final report    Organism ID, Bacteria Comment     Comment: Mixed urogenital flora Less than 10,000 colonies/mL   NuSwab BV and Candida, NAA     Status: None   Collection Time: 10/17/20 12:30 PM  Result Value Ref Range   Atopobium vaginae Low - 0 Score   BVAB 2 Low - 0 Score   Megasphaera 1 Low - 0 Score    Comment: Calculate total score by adding the 3 individual bacterial vaginosis (BV) marker scores together.  Total score is interpreted as follows: Total score 0-1: Indicates the absence of BV. Total score   2: Indeterminate for BV. Additional clinical                  data should be evaluated to establish a                   diagnosis. Total score 3-6: Indicates the presence of BV. This test was developed and its performance characteristics determined by [facility].  It has not been cleared or approved by the Food and Drug Administration.    Candida albicans, NAA Negative Negative   Candida glabrata, NAA Negative Negative  CBC w/Diff/Platelet     Status: Abnormal   Collection Time: 10/18/20  8:13 AM  Result Value Ref Range   WBC 4.5 3.4 - 10.8 x10E3/uL   RBC 4.57 3.77 - 5.28 x10E6/uL   Hemoglobin 12.0 11.1 - 15.9 g/dL   Hematocrit 82.8 61.0 - 46.6 %   MCV 80 79 - 97 fL   MCH 26.3 (L) 26.6 - 33.0 pg   MCHC 32.8 31.5 - 35.7 g/dL   RDW 00.0 80.0 - 78.5 %   Platelets 238 150 - 450 x10E3/uL   Neutrophils 57 Not Estab. %   Lymphs 32 Not Estab. %   Monocytes 8 Not Estab. %   Eos 2 Not Estab. %   Basos 1 Not Estab. %   Neutrophils Absolute 2.6 1.4 - 7.0 x10E3/uL   Lymphocytes Absolute 1.4 0.7 - 3.1 x10E3/uL   Monocytes Absolute 0.4 0.1 - 0.9 x10E3/uL   EOS (ABSOLUTE) 0.1 0.0 - 0.4 x10E3/uL   Basophils Absolute 0.0 0.0 - 0.2 x10E3/uL   Immature Granulocytes 0 Not Estab. %   Immature Grans (Abs) 0.0 0.0 - 0.1 x10E3/uL  Comprehensive Metabolic Panel (CMET)     Status: Abnormal   Collection Time: 10/18/20  8:13 AM  Result Value Ref Range  Glucose 123 (H) 65 - 99 mg/dL   BUN 14 6 - 24 mg/dL   Creatinine, Ser 0.68 0.57 - 1.00 mg/dL   eGFR 103 >59 mL/min/1.73   BUN/Creatinine Ratio 21 9 - 23   Sodium 140 134 - 144 mmol/L   Potassium 4.5 3.5 - 5.2 mmol/L   Chloride 98 96 - 106 mmol/L   CO2 23 20 - 29 mmol/L   Calcium 9.1 8.7 - 10.2 mg/dL   Total Protein 7.2 6.0 - 8.5 g/dL   Albumin 4.4 3.8 - 4.9 g/dL   Globulin, Total 2.8 1.5 - 4.5 g/dL   Albumin/Globulin Ratio 1.6 1.2 - 2.2   Bilirubin Total 0.3 0.0 - 1.2 mg/dL   Alkaline Phosphatase 99 44 - 121 IU/L   AST 22 0 - 40 IU/L   ALT 14 0 - 32 IU/L  Lipid Panel With LDL/HDL Ratio     Status: Abnormal   Collection Time: 10/18/20  8:13 AM  Result Value Ref  Range   Cholesterol, Total 248 (H) 100 - 199 mg/dL   Triglycerides 259 (H) 0 - 149 mg/dL   HDL 40 >39 mg/dL   VLDL Cholesterol Cal 48 (H) 5 - 40 mg/dL   LDL Chol Calc (NIH) 160 (H) 0 - 99 mg/dL   LDL/HDL Ratio 4.0 (H) 0.0 - 3.2 ratio    Comment:                                     LDL/HDL Ratio                                             Men  Women                               1/2 Avg.Risk  1.0    1.5                                   Avg.Risk  3.6    3.2                                2X Avg.Risk  6.2    5.0                                3X Avg.Risk  8.0    6.1   TSH + free T4     Status: None   Collection Time: 10/18/20  8:13 AM  Result Value Ref Range   TSH 3.510 0.450 - 4.500 uIU/mL   Free T4 0.94 0.82 - 1.77 ng/dL  Vitamin D (25 hydroxy)     Status: None   Collection Time: 10/18/20  8:13 AM  Result Value Ref Range   Vit D, 25-Hydroxy 36.6 30.0 - 100.0 ng/mL    Comment: Vitamin D deficiency has been defined by the [place] and an Endocrine Society practice guideline as a level of serum 25-OH vitamin D less than 20 ng/mL (1,2). The Endocrine Society went on to further define vitamin D insufficiency as a level  between 21 and 29 ng/mL (2). 1. IOM (Institute of Medicine). 2010. Dietary reference    intakes for calcium and D. [place]: The    [employer]. 2. Holick MF, B[place], Bischoff-Ferrari HA, et al.    Evaluation, treatment, and prevention of vitamin D    deficiency: an Endocrine Society clinical practice    guideline. JCEM. 2011 Jul; 96(7):1911-30.   B12     Status: None   Collection Time: 10/18/20  8:13 AM  Result Value Ref Range   Vitamin B-12 712 232 - 1,245 pg/mL  POCT Urine Drug Screen     Status: None   Collection Time: 11/15/20  8:43 AM  Result Value Ref Range   POC Methamphetamine UR None Detected None Detected   POC Opiate Ur None Detected None Detected   POC Barbiturate UR None Detected None Detected   POC Amphetamine  UR None Detected None Detected   POC Oxycodone UR None Detected None Detected   POC Cocaine UR None Detected None Detected   POC Ecstasy UR None Detected None Detected   POC TRICYCLICS UR None Detected None Detected   POC PHENCYCLIDINE UR None Detected None Detected   POC Marijuana UR None Detected None Detected   POC Methadone UR None Detected None Detected   POC BENZODIAZEPINES UR None Detected None Detected   URINE TEMPERATURE     POC DRUG SCREEN OXIDANTS URINE     POC SPECIFIC GRAVITY URINE     POC PH URINE     Methylenedioxyamphetamine None Detected None Detected  POCT HgB A1C     Status: Abnormal   Collection Time: 11/15/20  9:59 AM  Result Value Ref Range   Hemoglobin A1C 5.9 (A) 4.0 - 5.6 %   HbA1c POC (<> result, manual entry)     HbA1c, POC (prediabetic range)     HbA1c, POC (controlled diabetic range)    POC SOFIA 2 FLU + SARS ANTIGEN FIA     Status: None   Collection Time: 11/29/20 11:33 AM  Result Value Ref Range   Influenza A, POC     Influenza B, POC     SARS Coronavirus 2 Ag Negative Negative  UA/M w/rflx Culture, Routine     Status: None   Collection Time: 12/26/20  3:47 PM   Specimen: Urine   Urine  Result Value Ref Range   Specific Gravity, UA 1.019 1.005 - 1.030   pH, UA 5.5 5.0 - 7.5   Color, UA Yellow Yellow   Appearance Ur Clear Clear   Leukocytes,UA Negative Negative   Protein,UA Negative Negative/Trace   Glucose, UA Negative Negative   Ketones, UA Negative Negative   RBC, UA Negative Negative   Bilirubin, UA Negative Negative   Urobilinogen, Ur 0.2 0.2 - 1.0 mg/dL   Nitrite, UA Negative Negative   Microscopic Examination Comment     Comment: Microscopic follows if indicated.   Microscopic Examination See below:     Comment: Microscopic was indicated and was performed.   Urinalysis Reflex Comment     Comment: This specimen will not reflex to a Urine Culture.  Microscopic Examination     Status: None   Collection Time: 12/26/20  3:47 PM    Urine  Result Value Ref Range   WBC, UA None seen 0 - 5 /hpf   RBC None seen 0 - 2 /hpf   Epithelial Cells (non renal) 0-10 0 - 10 /hpf   Casts None seen None seen /lpf  Bacteria, UA None seen None seen/Few       Assessment/Plan: 1. Encounter for general adult medical examination with abnormal findings Patient is up-to-date on mammogram, Pap, and colonoscopy.  Has also had updated routine labs that were reviewed at last visit.  Breast exam performed by OB/GYN and therefore deferred on exam today  2. GAD (generalized anxiety disorder) Stable, continue Prozac and Wellbutrin  3. Episode of recurrent major depressive disorder, unspecified depression episode severity ([place]) Stable, continue Prozac and Wellbutrin  4. Mixed hyperlipidemia Will go ahead and start on Crestor daily and continue to monitor.  Patient advised to avoid fried and fatty foods - rosuvastatin (CRESTOR) 5 MG tablet; Take 1 tablet (5 mg total) by mouth daily.  Dispense: 90 tablet; Refill: 3  5. Prediabetes Will continue to monitor and patient will work on improving diet and exercise  6. Obesity (BMI 30.0-34.9) Discussed using calorie tracker apps such as my fitness pal and patient advised to work on portion control as well as increase exercise.  Patient is interested in metabolic testing and this will be performed at next visit.  7. Dysuria - UA/M w/rflx Culture, Routine   General Counseling: fedra lanter understanding of the findings of todays visit and agrees with plan of treatment. I have discussed any further diagnostic evaluation that may be needed or ordered today. We also reviewed her medications today. she has been encouraged to call the office with any questions or concerns that should arise related to todays visit.    Counseling:    Orders Placed This Encounter  Procedures   Microscopic Examination   UA/M w/rflx Culture, Routine    Meds ordered this encounter  Medications   rosuvastatin  (CRESTOR) 5 MG tablet    Sig: Take 1 tablet (5 mg total) by mouth daily.    Dispense:  90 tablet    Refill:  3    This patient was seen by Drema Dallas, PA-C in collaboration with Dr. Clayborn Bigness as a part of collaborative care agreement.  Total time spent:40 Minutes  Time spent includes review of chart, medications, test results, and follow up plan with the patient.     Lavera Guise, MD  Internal Medicine

## 2020-12-26 NOTE — Patient Instructions (Signed)
High Cholesterol  High cholesterol is a condition in which the blood has high levels of a white, waxy substance similar to fat (cholesterol). The liver makes all the cholesterol that the body needs. The human body needs small amounts of cholesterol to help build cells. A person gets extra orexcess cholesterol from the food that he or she eats. The blood carries cholesterol from the liver to the rest of the body. If you have high cholesterol, deposits (plaques) may build up on the walls of your arteries. Arteries are the blood vessels that carry blood away from your heart. These plaques make the arteries narrowand stiff. Cholesterol plaques increase your risk for heart attack and stroke. Work withyour health care provider to keep your cholesterol levels in a healthy range. What increases the risk? The following factors may make you more likely to develop this condition: Eating foods that are high in animal fat (saturated fat) or cholesterol. Being overweight. Not getting enough exercise. A family history of high cholesterol (familial hypercholesterolemia). Use of tobacco products. Having diabetes. What are the signs or symptoms? There are no symptoms of this condition. How is this diagnosed? This condition may be diagnosed based on the results of a blood test. If you are older than 55 years of age, your health care provider may check your cholesterol levels every 4-6 years. You may be checked more often if you have high cholesterol or other risk factors for heart disease. The blood test for cholesterol measures: "Bad" cholesterol, or LDL cholesterol. This is the main type of cholesterol that causes heart disease. The desired level is less than 100 mg/dL. "Good" cholesterol, or HDL cholesterol. HDL helps protect against heart disease by cleaning the arteries and carrying the LDL to the liver for processing. The desired level for HDL is 60 mg/dL or higher. Triglycerides. These are fats that your  body can store or burn for energy. The desired level is less than 150 mg/dL. Total cholesterol. This measures the total amount of cholesterol in your blood and includes LDL, HDL, and triglycerides. The desired level is less than 200 mg/dL. How is this treated? This condition may be treated with: Diet changes. You may be asked to eat foods that have more fiber and less saturated fats or added sugar. Lifestyle changes. These may include regular exercise, maintaining a healthy weight, and quitting use of tobacco products. Medicines. These are given when diet and lifestyle changes have not worked. You may be prescribed a statin medicine to help lower your cholesterol levels. Follow these instructions at home: Eating and drinking  Eat a healthy, balanced diet. This diet includes: Daily servings of a variety of fresh, frozen, or canned fruits and vegetables. Daily servings of whole grain foods that are rich in fiber. Foods that are low in saturated fats and trans fats. These include poultry and fish without skin, lean cuts of meat, and low-fat dairy products. A variety of fish, especially oily fish that contain omega-3 fatty acids. Aim to eat fish at least 2 times a week. Avoid foods and drinks that have added sugar. Use healthy cooking methods, such as roasting, grilling, broiling, baking, poaching, steaming, and stir-frying. Do not fry your food except for stir-frying.  Lifestyle  Get regular exercise. Aim to exercise for a total of 150 minutes a week. Increase your activity level by doing activities such as gardening, walking, and taking the stairs. Do not use any products that contain nicotine or tobacco, such as cigarettes, e-cigarettes, and chewing tobacco.   If you need help quitting, ask your health care provider.  General instructions Take over-the-counter and prescription medicines only as told by your health care provider. Keep all follow-up visits as told by your health care provider.  This is important. Where to find more information American Heart Association: www.heart.org National Heart, Lung, and Blood Institute: www.nhlbi.nih.gov Contact a health care provider if: You have trouble achieving or maintaining a healthy diet or weight. You are starting an exercise program. You are unable to stop smoking. Get help right away if: You have chest pain. You have trouble breathing. You have any symptoms of a stroke. "BE FAST" is an easy way to remember the main warning signs of a stroke: B - Balance. Signs are dizziness, sudden trouble walking, or loss of balance. E - Eyes. Signs are trouble seeing or a sudden change in vision. F - Face. Signs are sudden weakness or numbness of the face, or the face or eyelid drooping on one side. A - Arms. Signs are weakness or numbness in an arm. This happens suddenly and usually on one side of the body. S - Speech. Signs are sudden trouble speaking, slurred speech, or trouble understanding what people say. T - Time. Time to call emergency services. Write down what time symptoms started. You have other signs of a stroke, such as: A sudden, severe headache with no known cause. Nausea or vomiting. Seizure. These symptoms may represent a serious problem that is an emergency. Do not wait to see if the symptoms will go away. Get medical help right away. Call your local emergency services (911 in the [place] Do not drive yourself to the hospital. Summary Cholesterol plaques increase your risk for heart attack and stroke. Work with your health care provider to keep your cholesterol levels in a healthy range. Eat a healthy, balanced diet, get regular exercise, and maintain a healthy weight. Do not use any products that contain nicotine or tobacco, such as cigarettes, e-cigarettes, and chewing tobacco. Get help right away if you have any symptoms of a stroke. This information is not intended to replace advice given to you by your health care  provider. Make sure you discuss any questions you have with your healthcare provider. Document Revised: 05/25/2019 Document Reviewed: 05/25/2019 Elsevier Patient Education  2022 Elsevier Inc.  

## 2020-12-27 LAB — MICROSCOPIC EXAMINATION
Bacteria, UA: NONE SEEN
Casts: NONE SEEN /lpf
RBC, Urine: NONE SEEN /hpf (ref 0–2)
WBC, UA: NONE SEEN /hpf (ref 0–5)

## 2020-12-27 LAB — UA/M W/RFLX CULTURE, ROUTINE
Bilirubin, UA: NEGATIVE
Glucose, UA: NEGATIVE
Ketones, UA: NEGATIVE
Leukocytes,UA: NEGATIVE
Nitrite, UA: NEGATIVE
Protein,UA: NEGATIVE
RBC, UA: NEGATIVE
Specific Gravity, UA: 1.019 (ref 1.005–1.030)
Urobilinogen, Ur: 0.2 mg/dL (ref 0.2–1.0)
pH, UA: 5.5 (ref 5.0–7.5)

## 2021-02-15 ENCOUNTER — Telehealth: Payer: Self-pay

## 2021-02-15 ENCOUNTER — Other Ambulatory Visit: Payer: Self-pay

## 2021-02-15 DIAGNOSIS — M1990 Unspecified osteoarthritis, unspecified site: Secondary | ICD-10-CM

## 2021-02-15 DIAGNOSIS — R7303 Prediabetes: Secondary | ICD-10-CM

## 2021-02-15 DIAGNOSIS — Z0001 Encounter for general adult medical examination with abnormal findings: Secondary | ICD-10-CM

## 2021-02-15 DIAGNOSIS — F339 Major depressive disorder, recurrent, unspecified: Secondary | ICD-10-CM

## 2021-02-15 DIAGNOSIS — F411 Generalized anxiety disorder: Secondary | ICD-10-CM

## 2021-02-15 DIAGNOSIS — E782 Mixed hyperlipidemia: Secondary | ICD-10-CM

## 2021-02-15 DIAGNOSIS — E669 Obesity, unspecified: Secondary | ICD-10-CM

## 2021-02-15 DIAGNOSIS — R300 Dysuria: Secondary | ICD-10-CM

## 2021-02-15 MED ORDER — ETODOLAC 400 MG PO TABS: ORAL_TABLET | ORAL | 0 refills | Status: DC

## 2021-02-15 MED ORDER — ROSUVASTATIN CALCIUM 5 MG PO TABS: 5.0000 mg | ORAL_TABLET | Freq: Every day | ORAL | 1 refills | Status: DC

## 2021-02-15 NOTE — Telephone Encounter (Signed)
LMOM to confirm if pt is taking bupropion and fluoxetine

## 2021-02-16 ENCOUNTER — Other Ambulatory Visit: Payer: Self-pay

## 2021-02-16 DIAGNOSIS — F339 Major depressive disorder, recurrent, unspecified: Secondary | ICD-10-CM

## 2021-02-16 MED ORDER — FLUOXETINE HCL 40 MG PO CAPS: 40.0000 mg | ORAL_CAPSULE | Freq: Every day | ORAL | 1 refills | Status: DC

## 2021-02-16 MED ORDER — BUPROPION HCL ER (XL) 150 MG PO TB24: 150.0000 mg | ORAL_TABLET | Freq: Every day | ORAL | 1 refills | Status: DC

## 2021-02-27 ENCOUNTER — Ambulatory Visit: Payer: Managed Care, Other (non HMO) | Admitting: Physician Assistant

## 2021-03-06 ENCOUNTER — Other Ambulatory Visit: Payer: Self-pay

## 2021-03-06 ENCOUNTER — Ambulatory Visit: Payer: Managed Care, Other (non HMO) | Admitting: Internal Medicine

## 2021-03-06 ENCOUNTER — Encounter: Payer: Self-pay | Admitting: Internal Medicine

## 2021-03-06 VITALS — BP 126/84 | HR 82 | Temp 98.4°F | Resp 16 | Ht 60.0 in | Wt 168.0 lb

## 2021-03-06 DIAGNOSIS — S20462A Insect bite (nonvenomous) of left back wall of thorax, initial encounter: Secondary | ICD-10-CM

## 2021-03-06 DIAGNOSIS — E2839 Other primary ovarian failure: Secondary | ICD-10-CM | POA: Diagnosis not present

## 2021-03-06 DIAGNOSIS — L03312 Cellulitis of back [any part except buttock]: Secondary | ICD-10-CM | POA: Diagnosis not present

## 2021-03-06 DIAGNOSIS — R21 Rash and other nonspecific skin eruption: Secondary | ICD-10-CM

## 2021-03-06 DIAGNOSIS — W57XXXA Bitten or stung by nonvenomous insect and other nonvenomous arthropods, initial encounter: Secondary | ICD-10-CM | POA: Diagnosis not present

## 2021-03-06 MED ORDER — METHYLPREDNISOLONE ACETATE 80 MG/ML IJ SUSP
80.0000 mg | Freq: Once | INTRAMUSCULAR | Status: AC
  Administered 2021-03-06: 40 mg via INTRAMUSCULAR

## 2021-03-06 MED ORDER — BETAMETHASONE DIPROPIONATE AUG 0.05 % EX OINT: TOPICAL_OINTMENT | Freq: Two times a day (BID) | CUTANEOUS | 0 refills | Status: DC

## 2021-03-06 NOTE — Progress Notes (Addendum)
[facility] [place],  [address]  Internal MEDICINE  Office Visit Note  Patient Name: Kristine Stephens  [id number]  [id number]  Date of Service: 03/13/2021  Chief Complaint  Patient presents with   Insect Bite    Noticed on Thursday, getting worse. On right mid back area,  Warm to touch, bigger and painful, has another bite also on left shoulder area     HPI Pt is here for a sick visit. Patient is here for evaluation of insect bite, there are 2 areas of concern 1 in the left mid back and the other one behind her neck towards the right side and continues to have itching and redness.  there is involvement of both right and left side of the body. there is no vesicles patient denies any fever or chills    Current Medication:  Outpatient Encounter Medications as of 03/06/2021  Medication Sig   augmented betamethasone dipropionate (DIPROLENE) 0.05 % ointment Apply topically 2 (two) times daily. Apply to the affected 2 x day   b complex vitamins capsule Take 1 capsule by mouth daily.   buPROPion (WELLBUTRIN XL) 150 MG 24 hr tablet Take 1 tablet (150 mg total) by mouth daily.   estradiol (ESTRACE) 0.1 MG/GM vaginal cream INSERT 1 APPLICATORFUL VAGINALLY 3 TIMES A WEEK   etanercept (ENBREL) 50 MG/ML injection Inject 50 mg into the skin once a week.   etodolac (LODINE) 400 MG tablet TK 1 T PO BID   FLUoxetine (PROZAC) 40 MG capsule Take 1 capsule (40 mg total) by mouth daily.   omeprazole (PRILOSEC) 20 MG capsule Take 1 capsule (20 mg total) by mouth daily.   rosuvastatin (CRESTOR) 5 MG tablet Take 1 tablet (5 mg total) by mouth daily.   [DISCONTINUED] ergocalciferol (DRISDOL) 1.25 MG (50000 UT) capsule Take 1 capsule (50,000 Units total) by mouth once a week. (Patient not taking: Reported on 03/06/2021)   [EXPIRED] methylPREDNISolone acetate (DEPO-MEDROL) injection 80 mg    No facility-administered encounter medications on file as of 03/06/2021.       Medical History: Past Medical History:  Diagnosis Date   Anxiety    Depression    GERD (gastroesophageal reflux disease)    Psoriasis      Vital Signs: BP 126/84   Pulse 82   Temp 98.4 F (36.9 C)   Resp 16   Ht 5' (1.524 m)   Wt 168 lb (76.2 kg)   SpO2 96%   BMI 32.81 kg/m    Review of Systems  Constitutional:  Negative for fatigue and fever.  HENT:  Negative for congestion, mouth sores and postnasal drip.   Respiratory:  Negative for cough.   Cardiovascular:  Negative for chest pain.  Genitourinary:  Negative for flank pain.  Musculoskeletal:  Negative for arthralgias.  Skin:  Negative for rash.       Left upper back  Area is about  2x2 inches. Periphery is marked  Right side of neck is about 1x1. Periphery is marked   Psychiatric/Behavioral: Negative.     Physical Exam Constitutional:      Appearance: Normal appearance.  HENT:     Head: Normocephalic and atraumatic.     Nose: Nose normal.     Mouth/Throat:     Mouth: Mucous membranes are moist.     Pharynx: No posterior oropharyngeal erythema.  Eyes:     Extraocular Movements: Extraocular movements intact.     Pupils: Pupils are equal, round, and reactive  to light.  Cardiovascular:     Pulses: Normal pulses.     Heart sounds: Normal heart sounds.  Pulmonary:     Effort: Pulmonary effort is normal.     Breath sounds: Normal breath sounds.  Skin:    Findings: Erythema and rash present.  Neurological:     General: No focal deficit present.     Mental Status: She is alert.  Psychiatric:        Mood and Affect: Mood normal.        Behavior: Behavior normal.        Left upper back  Area is about  2x2 inches. Periphery is marked  Right side of neck is about 1x1. Periphery is marked   Psychiatric/Behavioral: Negative.      Assessment/Plan: 1. Cellulitis of back except buttock Start Doxy, if no improvement is seen in next 24 hours   2. Insect bite of left back wall of thorax, initial  encounter STS with erythema, add topical steroids patient is high risk due to her psoriasis's therapy, will stress dose her with IM prednisone  - methylPREDNISolone acetate (DEPO-MEDROL) injection 80 mg  3. Other primary ovarian failure - DG Bone Density; Future   General Counseling: antwan rucci understanding of the findings of todays visit and agrees with plan of treatment. I have discussed any further diagnostic evaluation that may be needed or ordered today. We also reviewed her medications today. she has been encouraged to call the office with any questions or concerns that should arise related to todays visit.     Orders Placed This Encounter  Procedures   DG Bone Density    Meds ordered this encounter  Medications   augmented betamethasone dipropionate (DIPROLENE) 0.05 % ointment    Sig: Apply topically 2 (two) times daily. Apply to the affected 2 x day    Dispense:  15 g    Refill:  0   methylPREDNISolone acetate (DEPO-MEDROL) injection 80 mg    [place] Controlled Substance Database was reviewed by me.  Time spent:30 Minutes

## 2021-03-08 ENCOUNTER — Other Ambulatory Visit: Payer: Self-pay

## 2021-03-08 MED ORDER — DOXYCYCLINE HYCLATE 100 MG PO CAPS: ORAL_CAPSULE | ORAL | 0 refills | Status: DC

## 2021-03-08 NOTE — Telephone Encounter (Signed)
Pt LMOM advising that the spider bite on her back has starrted going past the marker that was drawn.  Per [facility] we sent in doxycycline 100 mg twice a day

## 2021-03-10 IMAGING — MG DIGITAL SCREENING BILAT W/ TOMO W/ CAD
8 series · 8 of 24 positions shown · non-contrast
Comparison: Previous exam(s).

CLINICAL DATA: Screening.

EXAM:
DIGITAL SCREENING BILATERAL MAMMOGRAM WITH TOMO AND CAD

[L CC synth-2D]
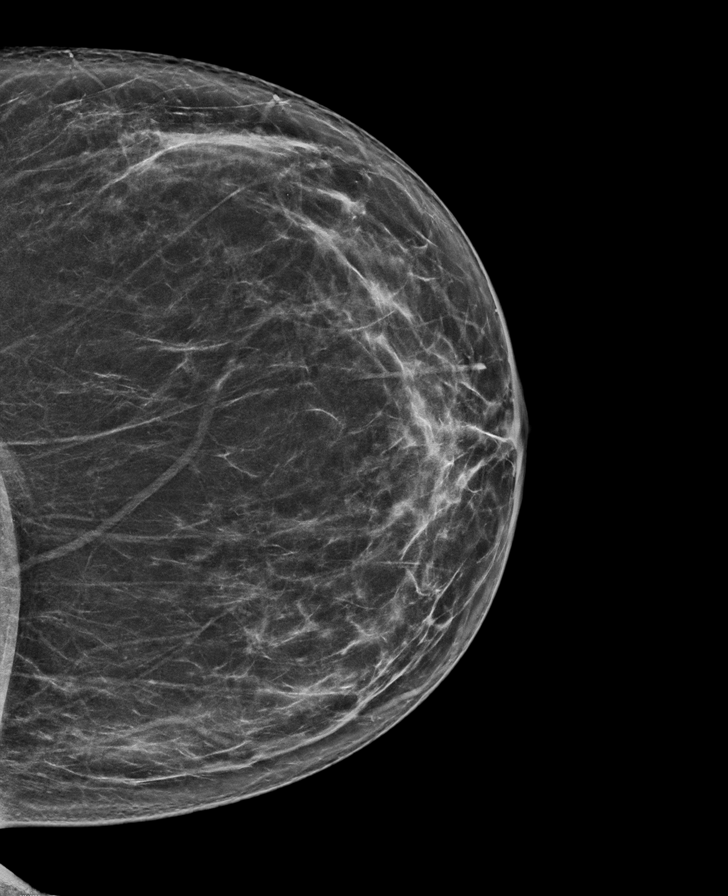

[R CC synth-2D]
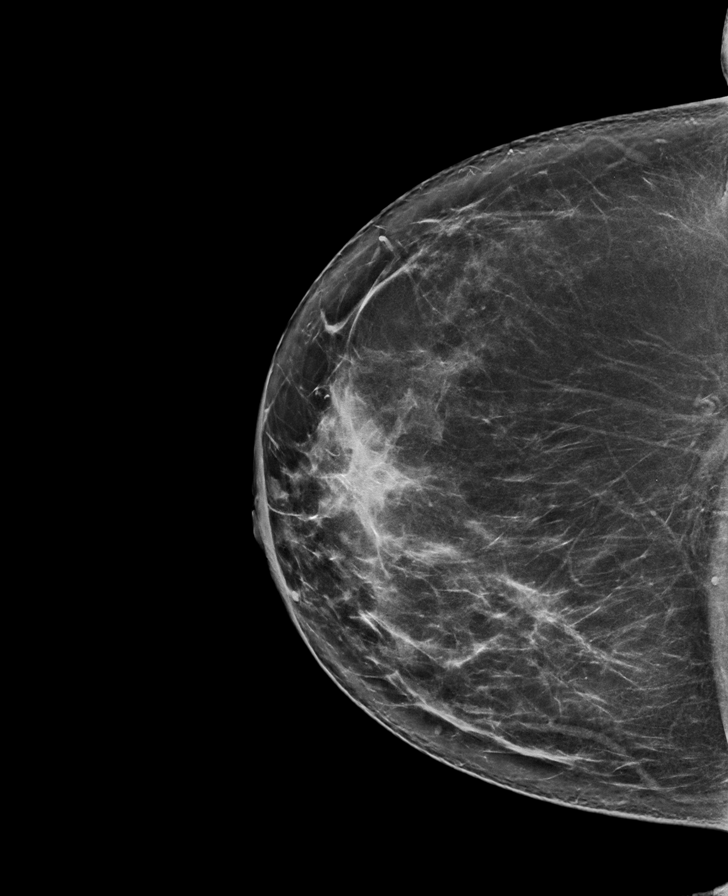

[L MLO synth-2D]
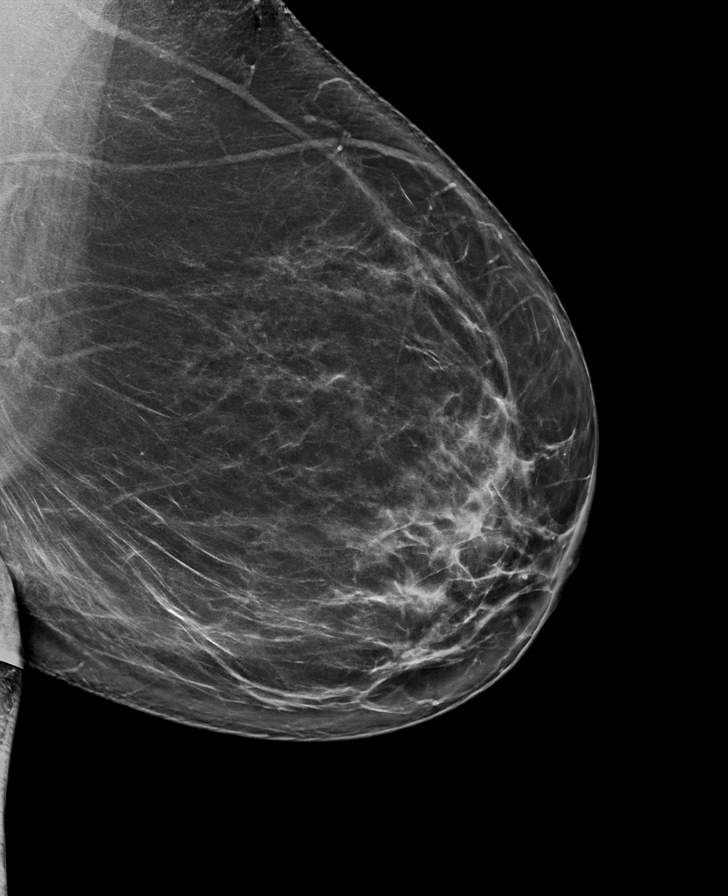

[R MLO synth-2D]
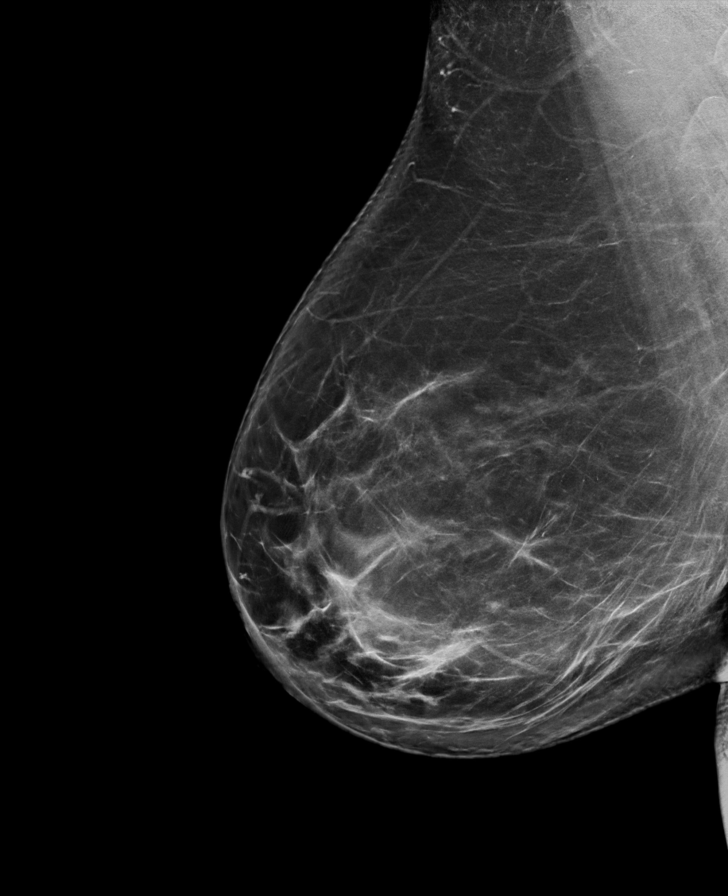

[L CC tomo · tomo slice 39/77.0]
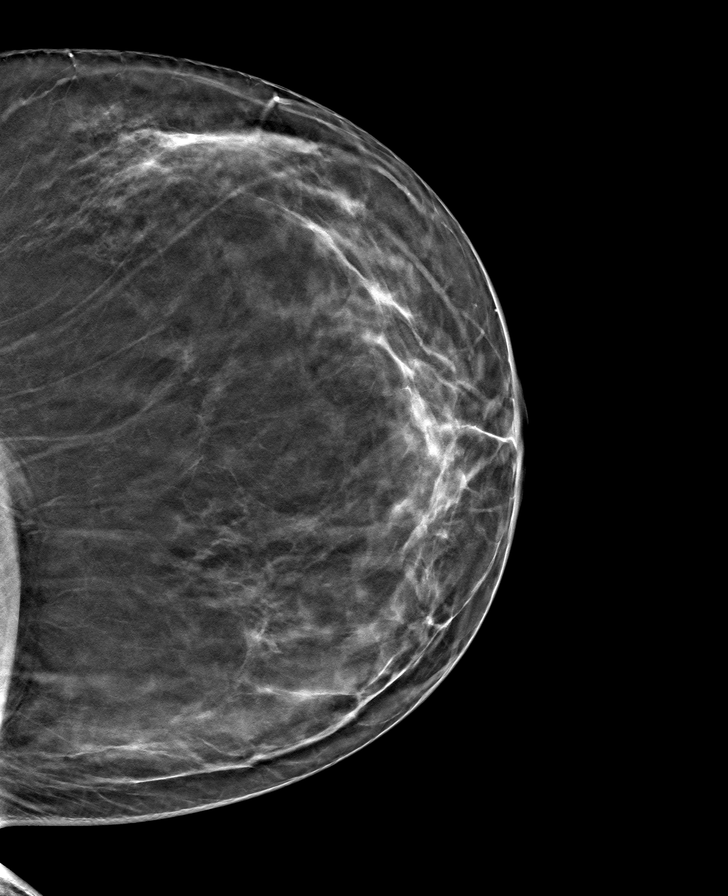

[R CC tomo · tomo slice 43/84.0]
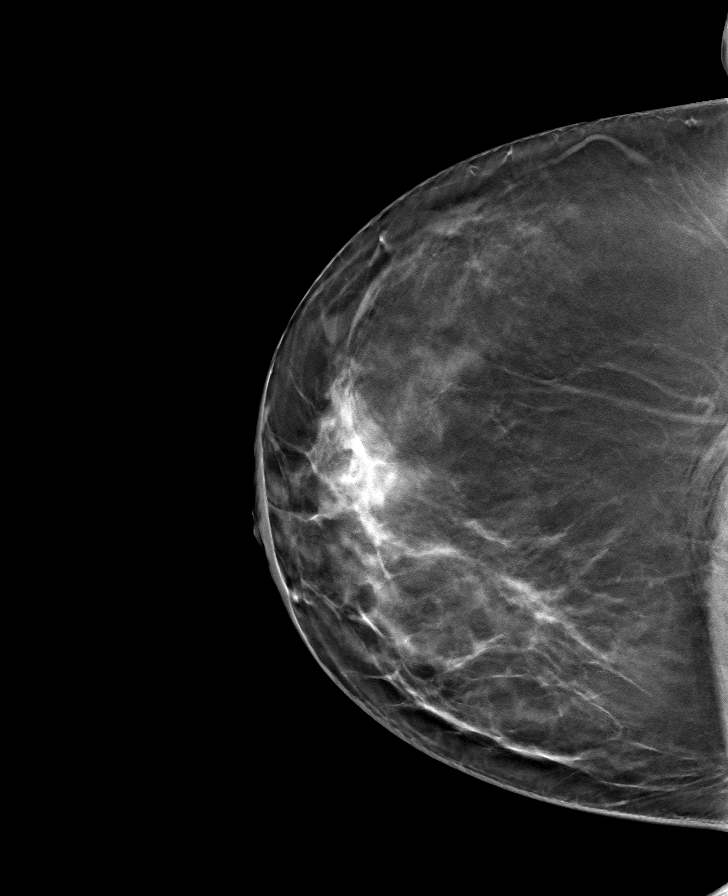

[R MLO tomo · tomo slice 49/96.0]
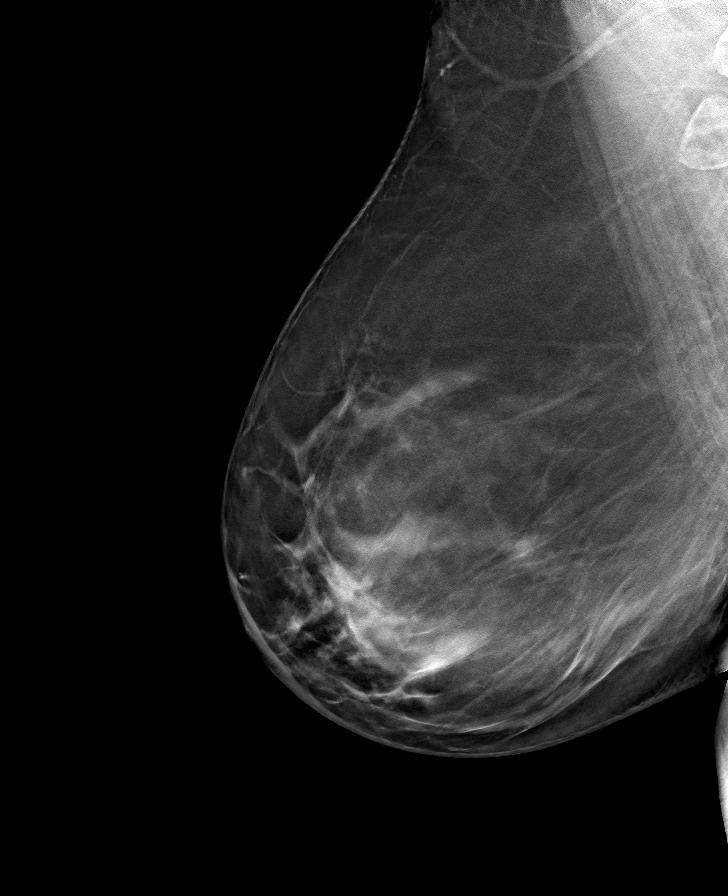

[L MLO tomo · tomo slice 45/89.0]
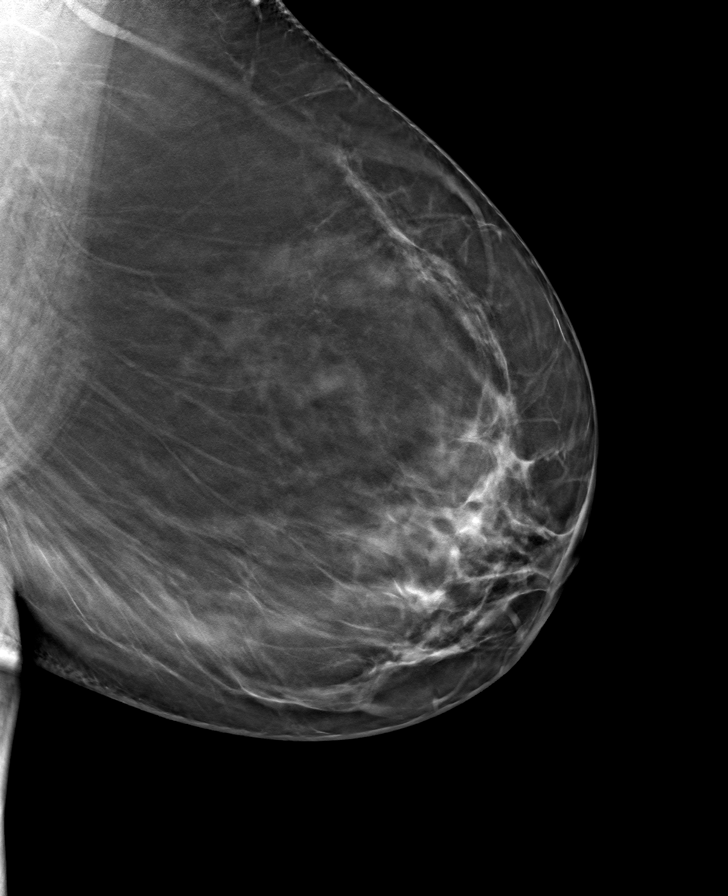

[8 of 24 positions shown; findings below may reference images not displayed]

ACR Breast Density Category b: There are scattered areas of
fibroglandular density.
FINDINGS: There are no findings suspicious for malignancy. Images were
processed with CAD.
IMPRESSION: No mammographic evidence of malignancy. A result letter of this
screening mammogram will be mailed directly to the patient.

RECOMMENDATION:
Screening mammogram in one year. (Code:[id number]-775)

BI-RADS CATEGORY  1: Negative.

## 2021-03-27 ENCOUNTER — Other Ambulatory Visit: Payer: Self-pay

## 2021-03-27 ENCOUNTER — Encounter: Payer: Self-pay | Admitting: Physician Assistant

## 2021-03-27 ENCOUNTER — Ambulatory Visit: Payer: Managed Care, Other (non HMO) | Admitting: Physician Assistant

## 2021-03-27 DIAGNOSIS — R7309 Other abnormal glucose: Secondary | ICD-10-CM

## 2021-03-27 DIAGNOSIS — R7303 Prediabetes: Secondary | ICD-10-CM

## 2021-03-27 DIAGNOSIS — E782 Mixed hyperlipidemia: Secondary | ICD-10-CM

## 2021-03-27 DIAGNOSIS — G4710 Hypersomnia, unspecified: Secondary | ICD-10-CM | POA: Diagnosis not present

## 2021-03-27 DIAGNOSIS — F411 Generalized anxiety disorder: Secondary | ICD-10-CM | POA: Diagnosis not present

## 2021-03-27 DIAGNOSIS — E669 Obesity, unspecified: Secondary | ICD-10-CM

## 2021-03-27 DIAGNOSIS — F988 Other specified behavioral and emotional disorders with onset usually occurring in childhood and adolescence: Secondary | ICD-10-CM | POA: Diagnosis not present

## 2021-03-27 DIAGNOSIS — F339 Major depressive disorder, recurrent, unspecified: Secondary | ICD-10-CM

## 2021-03-27 LAB — POCT GLYCOSYLATED HEMOGLOBIN (HGB A1C): Hemoglobin A1C: 6 % — AB (ref 4.0–5.6)

## 2021-03-27 MED ORDER — AMPHETAMINE-DEXTROAMPHET ER 10 MG PO CP24: 10.0000 mg | ORAL_CAPSULE | Freq: Every day | ORAL | 0 refills | Status: DC

## 2021-03-27 NOTE — Progress Notes (Signed)
[facility] [place], [place] [address]  Internal MEDICINE  Office Visit Note  Patient Name: Kristine Stephens  [id number]  [id number]  Date of Service: 03/29/2021  Chief Complaint  Patient presents with   Follow-up   Anxiety   Depression    HPI Pt is here for routine follow up -Recovered from spider bite -Wellbutrin keeps her up in the Am but then gets sleepy by lunch time. Previously on adderall for ADHD but stopped due to concern for increasing anxiety and trial wellbutrin to help, but pt states adderall helped her focus much more and would like to restart with the extended release version. -More forgetful lately, difficulty focusing and concentrating on tasks, loses track of time sometimes. -Groggy in AM. No morning headaches. -Does snore. Takes melatonin gummies before bed. Does have nightmares at times. No gasping or startling awake.  -Goes to sleep around 11 and wakes up at 6:30, sometimes wakes up though EPWORTH SLEEPINESS SCALE:  Scale:  (0)= no chance of dozing; (1)= slight chance of dozing; (2)= moderate chance of dozing; (3)= high chance of dozing  Chance  Situtation    Sitting and reading: 2    Watching TV: 0    Sitting Inactive in public: 1    As a passenger in car: 3      Lying down to rest: 3    Sitting and talking: 0    Sitting quielty after lunch: 1    In a car, stopped in traffic: 2   TOTAL SCORE:   12 out of 24   Current Medication: Outpatient Encounter Medications as of 03/27/2021  Medication Sig   amphetamine-dextroamphetamine (ADDERALL XR) 10 MG 24 hr capsule Take 1 capsule (10 mg total) by mouth daily.   augmented betamethasone dipropionate (DIPROLENE) 0.05 % ointment Apply topically 2 (two) times daily. Apply to the affected 2 x day   b complex vitamins capsule Take 1 capsule by mouth daily.   buPROPion (WELLBUTRIN XL) 150 MG 24 hr tablet Take 1 tablet (150 mg total) by mouth daily.   doxycycline (VIBRAMYCIN) 100  MG capsule Take 1 capsule by mouth twice a day for 7 days   estradiol (ESTRACE) 0.1 MG/GM vaginal cream INSERT 1 APPLICATORFUL VAGINALLY 3 TIMES A WEEK   etanercept (ENBREL) 50 MG/ML injection Inject 50 mg into the skin once a week.   etodolac (LODINE) 400 MG tablet TK 1 T PO BID   FLUoxetine (PROZAC) 40 MG capsule Take 1 capsule (40 mg total) by mouth daily.   omeprazole (PRILOSEC) 20 MG capsule Take 1 capsule (20 mg total) by mouth daily.   rosuvastatin (CRESTOR) 5 MG tablet Take 1 tablet (5 mg total) by mouth daily.   No facility-administered encounter medications on file as of 03/27/2021.    Surgical History: Past Surgical History:  Procedure Laterality Date   ASD [place]     COLONOSCOPY WITH PROPOFOL N/A 04/11/2017   Procedure: COLONOSCOPY WITH PROPOFOL;  Surgeon: Jonathon Bellows, MD;  Location: [facility] ENDOSCOPY;  Service: Gastroenterology;  Laterality: N/A;   COLPOSCOPY  2012   ENDOMETRIAL ABLATION     Failed ablation x2 (Tech difficulties)   LAPAROSCOPIC VAGINAL HYSTERECTOMY WITH SALPINGO OOPHORECTOMY  2012   Adenomyosis for AUB   TONSILLECTOMY     TUBAL LIGATION      Medical History: Past Medical History:  Diagnosis Date   Anxiety    Depression  GERD (gastroesophageal reflux disease)    Psoriasis     Family History: Family History  Problem Relation Age of Onset   Breast cancer Paternal Grandmother 28   Multiple myeloma Father    Thyroid disease Paternal Aunt     Social History   Socioeconomic History   Marital status: Married    Spouse name: Not on file   Number of children: Not on file   Years of education: Not on file   Highest education level: Not on file  Occupational History   Not on file  Tobacco Use   Smoking status: Never   Smokeless tobacco: Never  Vaping Use   Vaping Use: Never used  Substance and Sexual Activity   Alcohol use: No   Drug use: No   Sexual activity: Yes    Partners: Male     Birth control/protection: None  Other Topics Concern   Not on file  Social History Narrative   Not on file   Social Determinants of Health   Financial Resource Strain: Not on file  Food Insecurity: Not on file  Transportation Needs: Not on file  Physical Activity: Not on file  Stress: Not on file  Social Connections: Not on file  Intimate Partner Violence: Not on file      Review of Systems  Constitutional:  Positive for fatigue. Negative for chills and unexpected weight change.  HENT:  Negative for congestion, postnasal drip, rhinorrhea, sneezing and sore throat.   Eyes:  Negative for redness.  Respiratory:  Negative for cough, chest tightness and shortness of breath.   Cardiovascular:  Negative for chest pain and palpitations.  Gastrointestinal:  Negative for abdominal pain, constipation, diarrhea, nausea and vomiting.  Genitourinary:  Negative for dysuria and frequency.  Musculoskeletal:  Negative for arthralgias, back pain, joint swelling and neck pain.  Skin:  Negative for rash.  Neurological: Negative.  Negative for tremors and numbness.  Hematological:  Negative for adenopathy. Does not bruise/bleed easily.  Psychiatric/Behavioral:  Positive for behavioral problems (Depression), decreased concentration and sleep disturbance. Negative for suicidal ideas. The patient is nervous/anxious.    Vital Signs: BP 121/78   Pulse 77   Temp 97.8 F (36.6 C)   Resp 16   Ht 5' (1.524 m)   Wt 167 lb (75.8 kg)   SpO2 96%   BMI 32.61 kg/m    Physical Exam Vitals and nursing note reviewed.  Constitutional:      General: She is not in acute distress.    Appearance: She is well-developed. She is obese. She is not diaphoretic.  HENT:     Head: Normocephalic and atraumatic.     Right Ear: External ear normal.     Left Ear: External ear normal.     Nose: Nose normal.     Mouth/Throat:     Pharynx: No oropharyngeal exudate.  Eyes:     General: No scleral icterus.       Right  eye: No discharge.        Left eye: No discharge.     Conjunctiva/sclera: Conjunctivae normal.     Pupils: Pupils are equal, round, and reactive to light.  Neck:     Thyroid: No thyromegaly.     Vascular: No JVD.     Trachea: No tracheal deviation.  Cardiovascular:     Rate and Rhythm: Normal rate and regular rhythm.     Heart sounds: Normal heart sounds. No murmur heard.   No friction rub. No gallop.  Pulmonary:     Effort: Pulmonary effort is normal. No respiratory distress.     Breath sounds: Normal breath sounds. No stridor. No wheezing or rales.  Chest:     Chest wall: No tenderness.  Abdominal:     General: Bowel sounds are normal. There is no distension.     Palpations: Abdomen is soft. There is no mass.     Tenderness: There is no abdominal tenderness. There is no guarding or rebound.  Musculoskeletal:        General: No tenderness or deformity. Normal range of motion.     Cervical back: Normal range of motion and neck supple.  Lymphadenopathy:     Cervical: No cervical adenopathy.  Skin:    General: Skin is warm and dry.     Coloration: Skin is not pale.     Findings: No erythema or rash.  Neurological:     Mental Status: She is alert.     Cranial Nerves: No cranial nerve deficit.     Motor: No abnormal muscle tone.     Coordination: Coordination normal.     Deep Tendon Reflexes: Reflexes are normal and symmetric.  Psychiatric:        Behavior: Behavior normal.        Thought Content: Thought content normal.        Judgment: Judgment normal.       Assessment/Plan: 1. Prediabetes - POCT HgB A1C is 6.0 which is slightly elevated from 5.9 at last check.  We will continue to work on diet and exercise  2. Hypersomnia Based on excessive sleepiness, snoring, daytime fatigue, difficulty concentrating and memory concerns, as well as elevated BMI--we will order PSG for evaluation of OSA - PSG SLEEP STUDY  3. Attention deficit disorder (ADD) in adult Will restart  on 10 mg of Adderall but switch to extended release.  Patient instructed to only take this as needed and is aware that this can increase anxiety and that she should not take it if that occurs.  Patient should also not take this if it is impacting her sleep in any way and will contact office if so.  Did discuss that pending PSG results may need to reevaluate - amphetamine-dextroamphetamine (ADDERALL XR) 10 MG 24 hr capsule; Take 1 capsule (10 mg total) by mouth daily.  Dispense: 60 capsule; Refill: 0  4. GAD (generalized anxiety disorder) Stable, will continue with current medications  5. Episode of recurrent major depressive disorder, unspecified depression episode severity ([place]) Stable, will continue with current medications  6. Mixed hyperlipidemia Tolerating Crestor, will continue nightly  7. Obesity (BMI 30.0-34.9) Unable to perform a metabolic test today due to machine not currently working.  We will consider metabolic Test in future and we will go ahead and order PSG due to concerns for sleep apnea Obesity Counseling: Had a lengthy discussion regarding patients BMI and weight issues. Patient was instructed on portion control as well as increased activity. Also discussed caloric restrictions with trying to maintain intake less than 2000 Kcal. Discussions were made in accordance with the 5As of weight management. Simple actions such as not eating late and if able to, taking a walk is suggested.    General Counseling: sadhana frater understanding of the findings of todays visit and agrees with plan of treatment. I have discussed any further diagnostic evaluation that may be needed or ordered today. We also reviewed her medications today. she has been encouraged to call the office with any questions or concerns  that should arise related to todays visit.    Orders Placed This Encounter  Procedures   POCT HgB A1C   PSG SLEEP STUDY    Meds ordered this encounter  Medications    amphetamine-dextroamphetamine (ADDERALL XR) 10 MG 24 hr capsule    Sig: Take 1 capsule (10 mg total) by mouth daily.    Dispense:  60 capsule    Refill:  0    This patient was seen by Drema Dallas, PA-C in collaboration with Dr. Clayborn Bigness as a part of collaborative care agreement.   Total time spent:35 Minutes Time spent includes review of chart, medications, test results, and follow up plan with the patient.      Dr Lavera Guise Internal medicine

## 2021-04-18 ENCOUNTER — Telehealth: Payer: Self-pay

## 2021-04-18 NOTE — Telephone Encounter (Signed)
[facility] notified us via fax that they were unable to schedule pt a PSG due to cost of the study.

## 2021-05-04 ENCOUNTER — Ambulatory Visit: Payer: Managed Care, Other (non HMO) | Admitting: Physician Assistant

## 2021-05-04 ENCOUNTER — Other Ambulatory Visit: Payer: Self-pay

## 2021-05-04 ENCOUNTER — Encounter: Payer: Self-pay | Admitting: Physician Assistant

## 2021-05-04 DIAGNOSIS — F411 Generalized anxiety disorder: Secondary | ICD-10-CM | POA: Diagnosis not present

## 2021-05-04 DIAGNOSIS — E782 Mixed hyperlipidemia: Secondary | ICD-10-CM

## 2021-05-04 DIAGNOSIS — R7303 Prediabetes: Secondary | ICD-10-CM | POA: Diagnosis not present

## 2021-05-04 DIAGNOSIS — Z23 Encounter for immunization: Secondary | ICD-10-CM

## 2021-05-04 DIAGNOSIS — F339 Major depressive disorder, recurrent, unspecified: Secondary | ICD-10-CM

## 2021-05-04 DIAGNOSIS — F988 Other specified behavioral and emotional disorders with onset usually occurring in childhood and adolescence: Secondary | ICD-10-CM | POA: Diagnosis not present

## 2021-05-04 DIAGNOSIS — E669 Obesity, unspecified: Secondary | ICD-10-CM

## 2021-05-04 NOTE — Progress Notes (Signed)
[facility] [place], [place] [address]  Internal MEDICINE  Office Visit Note  Patient Name: Kristine Stephens  [id number]  [id number]  Date of Service: 05/09/2021  Chief Complaint  Patient presents with   Follow-up    Hasnt been feeling good all week but neg. Covid test last night   Depression   Anxiety   Weight Loss    HPI Pt is here for routine follow up -Was feeling sick from Monday through last night. Covid test was negative. Feels better today. Will hold off on flu shot for now though given recent illness. -Did start adderall and helps some with concentration, M-F and some saturdays. No increased anxiety with this -thinks wellbutrin may be making her sleepy instead of staying awake. Will try tapering down to 1/2 tablet then stopping if need be. -left heel hurts, hx of injections in this foot. May follow up with ortho if more bothersome. Will try icing with frozen water bottle. -Declined sleep study due to cost and does not think she has a problem with her sleep -Will try to increase activity and start exericing. Reports she comes home and sits and does not want to do anything after work, so may try to exercise in AM or work in some activity on certain evenings as part of a routine with set nights.  Current Medication: Outpatient Encounter Medications as of 05/04/2021  Medication Sig   amphetamine-dextroamphetamine (ADDERALL XR) 10 MG 24 hr capsule Take 1 capsule (10 mg total) by mouth daily.   b complex vitamins capsule Take 1 capsule by mouth daily.   buPROPion (WELLBUTRIN XL) 150 MG 24 hr tablet Take 1 tablet (150 mg total) by mouth daily.   estradiol (ESTRACE) 0.1 MG/GM vaginal cream INSERT 1 APPLICATORFUL VAGINALLY 3 TIMES A WEEK   etanercept (ENBREL) 50 MG/ML injection Inject 50 mg into the skin once a week.   etodolac (LODINE) 400 MG tablet TK 1 T PO BID   FLUoxetine (PROZAC) 40 MG capsule Take 1 capsule (40 mg total) by mouth daily.   omeprazole  (PRILOSEC) 20 MG capsule Take 1 capsule (20 mg total) by mouth daily.   rosuvastatin (CRESTOR) 5 MG tablet Take 1 tablet (5 mg total) by mouth daily.   [DISCONTINUED] augmented betamethasone dipropionate (DIPROLENE) 0.05 % ointment Apply topically 2 (two) times daily. Apply to the affected 2 x day (Patient not taking: Reported on 05/04/2021)   [DISCONTINUED] doxycycline (VIBRAMYCIN) 100 MG capsule Take 1 capsule by mouth twice a day for 7 days (Patient not taking: Reported on 05/04/2021)   No facility-administered encounter medications on file as of 05/04/2021.    Surgical History: Past Surgical History:  Procedure Laterality Date   ASD [place]     COLONOSCOPY WITH PROPOFOL N/A 04/11/2017   Procedure: COLONOSCOPY WITH PROPOFOL;  Surgeon: Jonathon Bellows, MD;  Location: [facility] ENDOSCOPY;  Service: Gastroenterology;  Laterality: N/A;   COLPOSCOPY  2012   ENDOMETRIAL ABLATION     Failed ablation x2 (Tech difficulties)   LAPAROSCOPIC VAGINAL HYSTERECTOMY WITH SALPINGO OOPHORECTOMY  2012   Adenomyosis for AUB   TONSILLECTOMY     TUBAL LIGATION      Medical History: Past Medical History:  Diagnosis Date   Anxiety    Depression    GERD (gastroesophageal reflux disease)    Psoriasis     Family History: Family History  Problem Relation Age of Onset  Breast cancer Paternal Grandmother 68   Multiple myeloma Father    Thyroid disease Paternal Aunt     Social History   Socioeconomic History   Marital status: Married    Spouse name: Not on file   Number of children: Not on file   Years of education: Not on file   Highest education level: Not on file  Occupational History   Not on file  Tobacco Use   Smoking status: Never   Smokeless tobacco: Never  Vaping Use   Vaping Use: Never used  Substance and Sexual Activity   Alcohol use: No   Drug use: No   Sexual activity: Yes    Partners: Male    Birth control/protection: None   Other Topics Concern   Not on file  Social History Narrative   Not on file   Social Determinants of Health   Financial Resource Strain: Not on file  Food Insecurity: Not on file  Transportation Needs: Not on file  Physical Activity: Not on file  Stress: Not on file  Social Connections: Not on file  Intimate Partner Violence: Not on file      Review of Systems  Constitutional:  Positive for fatigue. Negative for chills and unexpected weight change.  HENT:  Negative for congestion, postnasal drip, rhinorrhea, sneezing and sore throat.   Eyes:  Negative for redness.  Respiratory:  Negative for cough, chest tightness and shortness of breath.   Cardiovascular:  Negative for chest pain and palpitations.  Gastrointestinal:  Negative for abdominal pain, constipation, diarrhea, nausea and vomiting.  Genitourinary:  Negative for dysuria and frequency.  Musculoskeletal:  Negative for arthralgias, back pain, joint swelling and neck pain.  Skin:  Negative for rash.  Neurological: Negative.  Negative for tremors and numbness.  Hematological:  Negative for adenopathy. Does not bruise/bleed easily.  Psychiatric/Behavioral:  Positive for behavioral problems (Depression) and decreased concentration. Negative for sleep disturbance and suicidal ideas. The patient is nervous/anxious.    Vital Signs: BP 122/83   Pulse 85   Temp 98.6 F (37 C)   Resp 16   Ht 5' (1.524 m)   Wt 167 lb 6.4 oz (75.9 kg)   SpO2 99%   BMI 32.69 kg/m    Physical Exam Vitals and nursing note reviewed.  Constitutional:      General: She is not in acute distress.    Appearance: She is well-developed. She is obese. She is not diaphoretic.  HENT:     Head: Normocephalic and atraumatic.     Right Ear: External ear normal.     Left Ear: External ear normal.     Nose: Nose normal.     Mouth/Throat:     Pharynx: No oropharyngeal exudate.  Eyes:     General: No scleral icterus.       Right eye: No discharge.         Left eye: No discharge.     Conjunctiva/sclera: Conjunctivae normal.     Pupils: Pupils are equal, round, and reactive to light.  Neck:     Thyroid: No thyromegaly.     Vascular: No JVD.     Trachea: No tracheal deviation.  Cardiovascular:     Rate and Rhythm: Normal rate and regular rhythm.     Heart sounds: Normal heart sounds. No murmur heard.   No friction rub. No gallop.  Pulmonary:     Effort: Pulmonary effort is normal. No respiratory distress.     Breath sounds: Normal breath  sounds. No stridor. No wheezing or rales.  Chest:     Chest wall: No tenderness.  Abdominal:     General: Bowel sounds are normal. There is no distension.     Palpations: Abdomen is soft. There is no mass.     Tenderness: There is no abdominal tenderness. There is no guarding or rebound.  Musculoskeletal:        General: No tenderness or deformity. Normal range of motion.     Cervical back: Normal range of motion and neck supple.  Lymphadenopathy:     Cervical: No cervical adenopathy.  Skin:    General: Skin is warm and dry.     Coloration: Skin is not pale.     Findings: No erythema or rash.  Neurological:     Mental Status: She is alert.     Cranial Nerves: No cranial nerve deficit.     Motor: No abnormal muscle tone.     Coordination: Coordination normal.     Deep Tendon Reflexes: Reflexes are normal and symmetric.  Psychiatric:        Behavior: Behavior normal.        Thought Content: Thought content normal.        Judgment: Judgment normal.       Assessment/Plan: 1. Attention deficit disorder (ADD) in adult May continue adderall as needed, not yet due for refill and may call if needed prior to next visit  2. GAD (generalized anxiety disorder) Continue prozac, will cut wellbutrin in half to see if impact on fatigue--may d/c all together if not helping  3. Episode of recurrent major depressive disorder, unspecified depression episode severity ([place]) Continue prozac, will cut  wellbutrin in half to see if impact on fatigue--may d/c all together if not helping  4. Prediabetes Continue to work on diet and exercise  5. Mixed hyperlipidemia Continue crestor  6. Obesity (BMI 30.0-34.9) May need metabolic test in future. Will work on incorporating exercise in routine Obesity Counseling: Had a lengthy discussion regarding patients BMI and weight issues. Patient was instructed on portion control as well as increased activity. Also discussed caloric restrictions with trying to maintain intake less than 2000 Kcal. Discussions were made in accordance with the 5As of weight management. Simple actions such as not eating late and if able to, taking a walk is suggested.    General Counseling: zayleigh stroh understanding of the findings of todays visit and agrees with plan of treatment. I have discussed any further diagnostic evaluation that may be needed or ordered today. We also reviewed her medications today. she has been encouraged to call the office with any questions or concerns that should arise related to todays visit.    No orders of the defined types were placed in this encounter.   No orders of the defined types were placed in this encounter.   This patient was seen by Drema Dallas, PA-C in collaboration with Dr. Clayborn Bigness as a part of collaborative care agreement.   Total time spent:35 Minutes Time spent includes review of chart, medications, test results, and follow up plan with the patient.      Dr Lavera Guise Internal medicine

## 2021-07-05 ENCOUNTER — Encounter: Payer: Self-pay | Admitting: Nurse Practitioner

## 2021-07-05 ENCOUNTER — Telehealth: Payer: Managed Care, Other (non HMO) | Admitting: Nurse Practitioner

## 2021-07-05 VITALS — Ht 60.0 in | Wt 157.0 lb

## 2021-07-05 DIAGNOSIS — R6889 Other general symptoms and signs: Secondary | ICD-10-CM

## 2021-07-05 DIAGNOSIS — J018 Other acute sinusitis: Secondary | ICD-10-CM | POA: Diagnosis not present

## 2021-07-05 LAB — POCT INFLUENZA A/B
Influenza A, POC: NEGATIVE
Influenza B, POC: NEGATIVE

## 2021-07-05 MED ORDER — AMOXICILLIN-POT CLAVULANATE 875-125 MG PO TABS: 1.0000 | ORAL_TABLET | Freq: Two times a day (BID) | ORAL | 0 refills | Status: DC

## 2021-07-05 NOTE — Progress Notes (Signed)
[facility] [address] [place], [place] [address]  Internal MEDICINE  Telephone Visit  Patient Name: Kristine Stephens  [id number]  [id number]  Date of Service: 07/05/2021  I connected with the patient at 12:25 PM by telephone and verified the patients identity using two identifiers.   I discussed the limitations, risks, security and privacy concerns of performing an evaluation and management service by telephone and the availability of in person appointments. I also discussed with the patient that there may be a patient responsible charge related to the service.  The patient expressed understanding and agrees to proceed.    Chief Complaint  Patient presents with   Telephone Assessment    Home covid test negative    Telephone Screen    [id number]   Cough   Sinusitis   Sore Throat   Headache    HPI Kristine Stephens presents for a telehealth virtual visit for symptoms of sinusitis. Her home covid test was negative. She reports sore throat and headache. Symptoms started on Sunday.  She came to the clinic to be tested for the flu and it was negative.   Current Medication: Outpatient Encounter Medications as of 07/05/2021  Medication Sig   amoxicillin-clavulanate (AUGMENTIN) 875-125 MG tablet Take 1 tablet by mouth 2 (two) times daily. Take with food   amphetamine-dextroamphetamine (ADDERALL XR) 10 MG 24 hr capsule Take 1 capsule (10 mg total) by mouth daily.   b complex vitamins capsule Take 1 capsule by mouth daily.   buPROPion (WELLBUTRIN XL) 150 MG 24 hr tablet Take 1 tablet (150 mg total) by mouth daily.   estradiol (ESTRACE) 0.1 MG/GM vaginal cream INSERT 1 APPLICATORFUL VAGINALLY 3 TIMES A WEEK   etanercept (ENBREL) 50 MG/ML injection Inject 50 mg into the skin once a week.   etodolac (LODINE) 400 MG tablet TK 1 T PO BID   FLUoxetine (PROZAC) 40 MG capsule Take 1 capsule (40 mg total) by mouth daily.   omeprazole (PRILOSEC) 20 MG capsule Take 1 capsule (20 mg total) by  mouth daily.   rosuvastatin (CRESTOR) 5 MG tablet Take 1 tablet (5 mg total) by mouth daily.   No facility-administered encounter medications on file as of 07/05/2021.    Surgical History: Past Surgical History:  Procedure Laterality Date   ASD REPAIR  1985   CARPAL TUNNEL RELEASE     CESAREAN SECTION     COLONOSCOPY WITH PROPOFOL N/A 04/11/2017   Procedure: COLONOSCOPY WITH PROPOFOL;  Surgeon: Wyline Mood, MD;  Location: [facility] ENDOSCOPY;  Service: Gastroenterology;  Laterality: N/A;   COLPOSCOPY  2012   ENDOMETRIAL ABLATION     Failed ablation x2 (Tech difficulties)   LAPAROSCOPIC VAGINAL HYSTERECTOMY WITH SALPINGO OOPHORECTOMY  2012   Adenomyosis for AUB   TONSILLECTOMY     TUBAL LIGATION      Medical History: Past Medical History:  Diagnosis Date   Anxiety    Depression    GERD (gastroesophageal reflux disease)    Psoriasis     Family History: Family History  Problem Relation Age of Onset   Breast cancer Paternal Grandmother 9   Multiple myeloma Father    Thyroid disease Paternal Aunt     Social History   Socioeconomic History   Marital status: Married    Spouse name: Not on file   Number of children: Not on file   Years of education: Not on file   Highest education level: Not on file  Occupational History   Not on file  Tobacco Use   Smoking status: Never   Smokeless tobacco: Never  Vaping Use   Vaping Use: Never used  Substance and Sexual Activity   Alcohol use: No   Drug use: No   Sexual activity: Yes    Partners: Male    Birth control/protection: None  Other Topics Concern   Not on file  Social History Narrative   Not on file   Social Determinants of Health   Financial Resource Strain: Not on file  Food Insecurity: Not on file  Transportation Needs: Not on file  Physical Activity: Not on file  Stress: Not on file  Social Connections: Not on file  Intimate Partner Violence: Not on file      Review of Systems  Constitutional:   Positive for fatigue. Negative for chills and fever.  HENT:  Positive for congestion, postnasal drip, rhinorrhea, sinus pressure, sinus pain and sore throat. Negative for ear pain.   Respiratory:  Positive for cough. Negative for chest tightness, shortness of breath and wheezing.   Cardiovascular: Negative.  Negative for chest pain and palpitations.  Gastrointestinal: Negative.  Negative for abdominal pain, constipation, diarrhea, nausea and vomiting.  Musculoskeletal:  Positive for myalgias.  Skin:  Negative for rash.  Neurological:  Positive for headaches.   Vital Signs: Ht 5' (1.524 m)    Wt 157 lb (71.2 kg)    BMI 30.66 kg/m    Observation/Objective: She is alert and oriented and engages in conversation appropriately.  She does not appear to be in any acute distress over video call.    Assessment/Plan: 1. Acute non-recurrent sinusitis of other sinus Empiric antibiotic treatment prescribed - amoxicillin-clavulanate (AUGMENTIN) 875-125 MG tablet; Take 1 tablet by mouth 2 (two) times daily. Take with food  Dispense: 20 tablet; Refill: 0  2. Flu-like symptoms Negative for flu.  - POCT Influenza A/B   General Counseling: Kristine Stephens understanding of the findings of today's phone visit and agrees with plan of treatment. I have discussed any further diagnostic evaluation that may be needed or ordered today. We also reviewed her medications today. she has been encouraged to call the office with any questions or concerns that should arise related to todays visit.  No follow-ups on file.   Orders Placed This Encounter  Procedures   POCT Influenza A/B    Meds ordered this encounter  Medications   amoxicillin-clavulanate (AUGMENTIN) 875-125 MG tablet    Sig: Take 1 tablet by mouth 2 (two) times daily. Take with food    Dispense:  20 tablet    Refill:  0    Time spent:10 Minutes Time spent with patient included reviewing progress notes, labs, imaging studies, and  discussing plan for follow up.  [place] Controlled Substance Database was reviewed by me for overdose risk score (ORS) if appropriate.  This patient was seen by Jonetta Osgood, FNP-C in collaboration with Dr. Clayborn Bigness as a part of collaborative care agreement.  Shadman Tozzi R. Valetta Fuller, MSN, FNP-C Internal medicine

## 2021-07-06 ENCOUNTER — Telehealth: Payer: Self-pay

## 2021-07-06 NOTE — Telephone Encounter (Signed)
Per patient's request, I emailed her copy of flu results-Kristine Stephens

## 2021-07-24 ENCOUNTER — Other Ambulatory Visit: Payer: Self-pay | Admitting: Family Medicine

## 2021-07-24 ENCOUNTER — Other Ambulatory Visit: Payer: Self-pay | Admitting: Internal Medicine

## 2021-07-24 ENCOUNTER — Other Ambulatory Visit: Payer: Self-pay | Admitting: Obstetrics and Gynecology

## 2021-07-24 DIAGNOSIS — Z1231 Encounter for screening mammogram for malignant neoplasm of breast: Secondary | ICD-10-CM

## 2021-08-03 ENCOUNTER — Other Ambulatory Visit: Payer: Self-pay

## 2021-08-03 ENCOUNTER — Ambulatory Visit: Payer: Managed Care, Other (non HMO) | Admitting: Physician Assistant

## 2021-08-03 ENCOUNTER — Encounter: Payer: Self-pay | Admitting: Physician Assistant

## 2021-08-03 DIAGNOSIS — E2839 Other primary ovarian failure: Secondary | ICD-10-CM | POA: Diagnosis not present

## 2021-08-03 DIAGNOSIS — E782 Mixed hyperlipidemia: Secondary | ICD-10-CM | POA: Diagnosis not present

## 2021-08-03 DIAGNOSIS — F988 Other specified behavioral and emotional disorders with onset usually occurring in childhood and adolescence: Secondary | ICD-10-CM

## 2021-08-03 DIAGNOSIS — Z23 Encounter for immunization: Secondary | ICD-10-CM

## 2021-08-03 DIAGNOSIS — R7303 Prediabetes: Secondary | ICD-10-CM

## 2021-08-03 DIAGNOSIS — Z0001 Encounter for general adult medical examination with abnormal findings: Secondary | ICD-10-CM

## 2021-08-03 DIAGNOSIS — K219 Gastro-esophageal reflux disease without esophagitis: Secondary | ICD-10-CM | POA: Diagnosis not present

## 2021-08-03 DIAGNOSIS — R300 Dysuria: Secondary | ICD-10-CM

## 2021-08-03 DIAGNOSIS — F411 Generalized anxiety disorder: Secondary | ICD-10-CM

## 2021-08-03 DIAGNOSIS — E669 Obesity, unspecified: Secondary | ICD-10-CM

## 2021-08-03 DIAGNOSIS — F339 Major depressive disorder, recurrent, unspecified: Secondary | ICD-10-CM

## 2021-08-03 MED ORDER — OMEPRAZOLE 20 MG PO CPDR: 20.0000 mg | DELAYED_RELEASE_CAPSULE | Freq: Every day | ORAL | 3 refills | Status: DC

## 2021-08-03 MED ORDER — AMPHETAMINE-DEXTROAMPHET ER 10 MG PO CP24: 10.0000 mg | ORAL_CAPSULE | Freq: Every day | ORAL | 0 refills | Status: DC

## 2021-08-03 MED ORDER — ZOSTER VAC RECOMB ADJUVANTED 50 MCG/0.5ML IM SUSR: 0.5000 mL | Freq: Once | INTRAMUSCULAR | 1 refills | Status: AC

## 2021-08-03 MED ORDER — FLUOXETINE HCL 40 MG PO CAPS: 40.0000 mg | ORAL_CAPSULE | Freq: Every day | ORAL | 1 refills | Status: DC

## 2021-08-03 MED ORDER — ROSUVASTATIN CALCIUM 5 MG PO TABS: 5.0000 mg | ORAL_TABLET | Freq: Every day | ORAL | 1 refills | Status: DC

## 2021-08-03 NOTE — Progress Notes (Signed)
[facility] [place], [place] [address]  Internal MEDICINE  Office Visit Note  Patient Name: Kristine Stephens  [id number]  [id number]  Date of Service: 08/09/2021  Chief Complaint  Patient presents with   Follow-up   Depression   Gastroesophageal Reflux   Anxiety    HPI Pt is here for routine follow up -She has been taking adderall as needed to help with concentration. Denies increased anxiety. -wellbutrin stopped because it was making her feel off -Does note she was having some pain in legs when sleeping, magnesium seems to be helping this though -Does have psoriatic arthritis -she has her mammogram scheduled in feb, but not her bone density--will order today to see if these can be done the same day -due for refills today  Current Medication: Outpatient Encounter Medications as of 08/03/2021  Medication Sig   amoxicillin-clavulanate (AUGMENTIN) 875-125 MG tablet Take 1 tablet by mouth 2 (two) times daily. Take with food   b complex vitamins capsule Take 1 capsule by mouth daily.   buPROPion (WELLBUTRIN XL) 150 MG 24 hr tablet Take 1 tablet (150 mg total) by mouth daily.   estradiol (ESTRACE) 0.1 MG/GM vaginal cream INSERT 1 APPLICATORFUL VAGINALLY 3 TIMES A WEEK   etanercept (ENBREL) 50 MG/ML injection Inject 50 mg into the skin once a week.   etodolac (LODINE) 400 MG tablet TK 1 T PO BID   [DISCONTINUED] amphetamine-dextroamphetamine (ADDERALL XR) 10 MG 24 hr capsule Take 1 capsule (10 mg total) by mouth daily.   [DISCONTINUED] FLUoxetine (PROZAC) 40 MG capsule Take 1 capsule (40 mg total) by mouth daily.   [DISCONTINUED] omeprazole (PRILOSEC) 20 MG capsule Take 1 capsule (20 mg total) by mouth daily.   [DISCONTINUED] rosuvastatin (CRESTOR) 5 MG tablet Take 1 tablet (5 mg total) by mouth daily.   [DISCONTINUED] Zoster Vaccine Adjuvanted [facility]) injection Inject 0.5 mLs into the muscle once.   amphetamine-dextroamphetamine (ADDERALL XR) 10 MG 24 hr  capsule Take 1 capsule (10 mg total) by mouth daily.   FLUoxetine (PROZAC) 40 MG capsule Take 1 capsule (40 mg total) by mouth daily.   omeprazole (PRILOSEC) 20 MG capsule Take 1 capsule (20 mg total) by mouth daily.   rosuvastatin (CRESTOR) 5 MG tablet Take 1 tablet (5 mg total) by mouth daily.   [EXPIRED] Zoster Vaccine Adjuvanted [facility]) injection Inject 0.5 mLs into the muscle once for 1 dose.   No facility-administered encounter medications on file as of 08/03/2021.    Surgical History: Past Surgical History:  Procedure Laterality Date   ASD [place]     COLONOSCOPY WITH PROPOFOL N/A 04/11/2017   Procedure: COLONOSCOPY WITH PROPOFOL;  Surgeon: Jonathon Bellows, MD;  Location: [facility] ENDOSCOPY;  Service: Gastroenterology;  Laterality: N/A;   COLPOSCOPY  2012   ENDOMETRIAL ABLATION     Failed ablation x2 (Tech difficulties)   LAPAROSCOPIC VAGINAL HYSTERECTOMY WITH SALPINGO OOPHORECTOMY  2012   Adenomyosis for AUB   TONSILLECTOMY     TUBAL LIGATION      Medical History: Past Medical History:  Diagnosis Date   Anxiety    Depression    GERD (gastroesophageal reflux disease)    Psoriasis     Family History: Family History  Problem Relation Age of Onset   Breast cancer Paternal Grandmother 69   Multiple myeloma Father    Thyroid disease Paternal Aunt     Social History   Socioeconomic History  Marital status: Married    Spouse name: Not on file   Number of children: Not on file   Years of education: Not on file   Highest education level: Not on file  Occupational History   Not on file  Tobacco Use   Smoking status: Never   Smokeless tobacco: Never  Vaping Use   Vaping Use: Never used  Substance and Sexual Activity   Alcohol use: No   Drug use: No   Sexual activity: Yes    Partners: Male    Birth control/protection: None  Other Topics Concern   Not on file  Social History Narrative   Not on file   Social  Determinants of Health   Financial Resource Strain: Not on file  Food Insecurity: Not on file  Transportation Needs: Not on file  Physical Activity: Not on file  Stress: Not on file  Social Connections: Not on file  Intimate Partner Violence: Not on file      Review of Systems  Constitutional:  Positive for fatigue. Negative for chills and unexpected weight change.  HENT:  Negative for congestion, postnasal drip, rhinorrhea, sneezing and sore throat.   Eyes:  Negative for redness.  Respiratory:  Negative for cough, chest tightness and shortness of breath.   Cardiovascular:  Negative for chest pain and palpitations.  Gastrointestinal:  Negative for abdominal pain, constipation, diarrhea, nausea and vomiting.  Genitourinary:  Negative for dysuria and frequency.  Musculoskeletal:  Negative for arthralgias, back pain, joint swelling and neck pain.  Skin:  Negative for rash.  Neurological: Negative.  Negative for tremors and numbness.  Hematological:  Negative for adenopathy. Does not bruise/bleed easily.  Psychiatric/Behavioral:  Positive for behavioral problems (Depression) and decreased concentration. Negative for sleep disturbance and suicidal ideas. The patient is nervous/anxious.    Vital Signs: BP 115/77    Pulse 84    Temp 98.4 F (36.9 C)    Resp 16    Ht 5' (1.524 m)    Wt 171 lb 3.2 oz (77.7 kg)    SpO2 97%    BMI 33.44 kg/m    Physical Exam Vitals and nursing note reviewed.  Constitutional:      General: She is not in acute distress.    Appearance: She is well-developed. She is obese. She is not diaphoretic.  HENT:     Head: Normocephalic and atraumatic.     Mouth/Throat:     Pharynx: No oropharyngeal exudate.  Eyes:     Pupils: Pupils are equal, round, and reactive to light.  Neck:     Thyroid: No thyromegaly.     Vascular: No JVD.     Trachea: No tracheal deviation.  Cardiovascular:     Rate and Rhythm: Normal rate and regular rhythm.     Heart sounds:  Normal heart sounds. No murmur heard.   No friction rub. No gallop.  Pulmonary:     Effort: Pulmonary effort is normal. No respiratory distress.     Breath sounds: No wheezing or rales.  Chest:     Chest wall: No tenderness.  Abdominal:     General: Bowel sounds are normal.     Palpations: Abdomen is soft.  Musculoskeletal:        General: Normal range of motion.     Cervical back: Normal range of motion and neck supple.  Lymphadenopathy:     Cervical: No cervical adenopathy.  Skin:    General: Skin is warm and dry.  Neurological:  Mental Status: She is alert and oriented to person, place, and time.     Cranial Nerves: No cranial nerve deficit.  Psychiatric:        Behavior: Behavior normal.        Thought Content: Thought content normal.        Judgment: Judgment normal.       Assessment/Plan: 1. Attention deficit disorder (ADD) in adult May continue Adderall as needed - amphetamine-dextroamphetamine (ADDERALL XR) 10 MG 24 hr capsule; Take 1 capsule (10 mg total) by mouth daily.  Dispense: 60 capsule; Refill: 0 [place] Controlled Substance Database was reviewed by me for overdose risk score (ORS) Refilled Controlled medications today. Reviewed risks and possible side effects associated with taking Stimulants. Combination of these drugs with other psychotropic medications could cause dizziness and drowsiness. Pt needs to Monitor symptoms and exercise caution in driving and operating heavy machinery to avoid damages to oneself, to others and to the surroundings. Patient verbalized understanding in this matter. Dependence and abuse for these drugs will be monitored closely. A Controlled substance policy and procedure is on file which allows [facility] to order a urine drug screen test at any visit. Patient understands and agrees with the plan..  2. Episode of recurrent major depressive disorder, unspecified depression episode severity ([place]) Stable, continue Prozac -  FLUoxetine (PROZAC) 40 MG capsule; Take 1 capsule (40 mg total) by mouth daily.  Dispense: 90 capsule; Refill: 1  3. GAD (generalized anxiety disorder) Stable, continue Prozac  4. Mixed hyperlipidemia - rosuvastatin (CRESTOR) 5 MG tablet; Take 1 tablet (5 mg total) by mouth daily.  Dispense: 90 tablet; Refill: 1  5. Gastroesophageal reflux disease without esophagitis - omeprazole (PRILOSEC) 20 MG capsule; Take 1 capsule (20 mg total) by mouth daily.  Dispense: 90 capsule; Refill: 3  6. Need for shingles vaccine - Zoster Vaccine Adjuvanted [facility]) injection; Inject 0.5 mLs into the muscle once for 1 dose.  Dispense: 0.5 mL; Refill: 1  7. Primary ovarian failure - DG Bone Density; Future   General Counseling: eliese kerwood understanding of the findings of todays visit and agrees with plan of treatment. I have discussed any further diagnostic evaluation that may be needed or ordered today. We also reviewed her medications today. she has been encouraged to call the office with any questions or concerns that should arise related to todays visit.    Orders Placed This Encounter  Procedures   DG Bone Density    Meds ordered this encounter  Medications   amphetamine-dextroamphetamine (ADDERALL XR) 10 MG 24 hr capsule    Sig: Take 1 capsule (10 mg total) by mouth daily.    Dispense:  60 capsule    Refill:  0   FLUoxetine (PROZAC) 40 MG capsule    Sig: Take 1 capsule (40 mg total) by mouth daily.    Dispense:  90 capsule    Refill:  1   rosuvastatin (CRESTOR) 5 MG tablet    Sig: Take 1 tablet (5 mg total) by mouth daily.    Dispense:  90 tablet    Refill:  1   omeprazole (PRILOSEC) 20 MG capsule    Sig: Take 1 capsule (20 mg total) by mouth daily.    Dispense:  90 capsule    Refill:  3   Zoster Vaccine Adjuvanted [place]) injection    Sig: Inject 0.5 mLs into the muscle once for 1 dose.    Dispense:  0.5 mL    Refill:  1  This patient was seen by Drema Dallas,  PA-C in collaboration with Dr. Clayborn Bigness as a part of collaborative care agreement.   Total time spent:30 Minutes Time spent includes review of chart, medications, test results, and follow up plan with the patient.      Dr Lavera Guise Internal medicine

## 2021-08-31 ENCOUNTER — Ambulatory Visit
Admission: RE | Admit: 2021-08-31 | Discharge: 2021-08-31 | Disposition: A | Discharge status: Home / Self Care | Payer: Managed Care, Other (non HMO) | Source: Ambulatory Visit | Attending: Internal Medicine | Admitting: Internal Medicine

## 2021-08-31 ENCOUNTER — Other Ambulatory Visit: Payer: Self-pay

## 2021-08-31 DIAGNOSIS — Z1231 Encounter for screening mammogram for malignant neoplasm of breast: Secondary | ICD-10-CM | POA: Diagnosis present

## 2021-10-05 ENCOUNTER — Telehealth (INDEPENDENT_AMBULATORY_CARE_PROVIDER_SITE_OTHER): Payer: Managed Care, Other (non HMO) | Admitting: Physician Assistant

## 2021-10-05 ENCOUNTER — Encounter: Payer: Self-pay | Admitting: Physician Assistant

## 2021-10-05 DIAGNOSIS — F988 Other specified behavioral and emotional disorders with onset usually occurring in childhood and adolescence: Secondary | ICD-10-CM | POA: Diagnosis not present

## 2021-10-05 DIAGNOSIS — U071 COVID-19: Secondary | ICD-10-CM | POA: Diagnosis not present

## 2021-10-05 MED ORDER — LISDEXAMFETAMINE DIMESYLATE 20 MG PO CAPS: 20.0000 mg | ORAL_CAPSULE | Freq: Every day | ORAL | 0 refills | Status: DC

## 2021-10-05 NOTE — Progress Notes (Signed)
[facility] ?[address] ?[place], [place] [address] ? ?Internal MEDICINE  ?Telephone Visit ? ?Patient Name: Kristine Stephens ? [id number]  ?[id number] ? ?Date of Service: 10/05/2021 ? ?I connected with the patient at 3:40 by telephone and verified the patients identity using two identifiers.   ?I discussed the limitations, risks, security and privacy concerns of performing an evaluation and management service by telephone and the availability of in person appointments. I also discussed with the patient that there may be a patient responsible charge related to the service.  The patient expressed understanding and agrees to proceed.   ? ?Chief Complaint  ?Patient presents with  ? Telephone Screen  ? Telephone Assessment  ?  [phone], might be picking up granddaughter from school so is willing to just do a phone call  ? Nasal Congestion  ? Cough  ?  + Covid  ? Fatigue  ? ? ?HPI ? ?Pt is here for virtual sick visit. She was supposed to be seen for follow up to discuss medication change, but become covid positive and was switched to virtual. ?-Started having body aches, headaches, congestion on Thursday night, and then progressed some the next few days. Very fatigued. -Tested positive for covid on Friday ?-Starting to feel a little better, supposed to go back to work on [employer] ?-Took pseudofed sinus, mucinex, and ibuprofen ?-Unable to get adderall recently and is interested in trying alternative Vyvance. Discussed making this change however to hold off starting until fully recovered from covid and patient understood. ?-Also interested in discussing weight loss and will need to wait until in person after vyvance trial ? ?Current Medication: ?Outpatient Encounter Medications as of 10/05/2021  ?Medication Sig  ? amoxicillin-clavulanate (AUGMENTIN) 875-125 MG tablet Take 1 tablet by mouth 2 (two) times daily. Take with food  ? b complex vitamins capsule Take 1 capsule by mouth daily.  ? buPROPion  (WELLBUTRIN XL) 150 MG 24 hr tablet Take 1 tablet (150 mg total) by mouth daily.  ? estradiol (ESTRACE) 0.1 MG/GM vaginal cream INSERT 1 APPLICATORFUL VAGINALLY 3 TIMES A WEEK  ? etanercept (ENBREL) 50 MG/ML injection Inject 50 mg into the skin once a week.  ? etodolac (LODINE) 400 MG tablet TK 1 T PO BID  ? FLUoxetine (PROZAC) 40 MG capsule Take 1 capsule (40 mg total) by mouth daily.  ? lisdexamfetamine (VYVANSE) 20 MG capsule Take 1 capsule (20 mg total) by mouth daily.  ? omeprazole (PRILOSEC) 20 MG capsule Take 1 capsule (20 mg total) by mouth daily.  ? rosuvastatin (CRESTOR) 5 MG tablet Take 1 tablet (5 mg total) by mouth daily.  ? [DISCONTINUED] amphetamine-dextroamphetamine (ADDERALL XR) 10 MG 24 hr capsule Take 1 capsule (10 mg total) by mouth daily.  ? ?No facility-administered encounter medications on file as of 10/05/2021.  ? ? ?Surgical History: ?Past Surgical History:  ?Procedure Laterality Date  ? ASD REPAIR  1985  ? CARPAL TUNNEL RELEASE    ? CESAREAN SECTION    ? COLONOSCOPY WITH PROPOFOL N/A 04/11/2017  ? Procedure: COLONOSCOPY WITH PROPOFOL;  Surgeon: Jonathon Bellows, MD;  Location: [facility] ENDOSCOPY;  Service: Gastroenterology;  Laterality: N/A;  ? COLPOSCOPY  2012  ? ENDOMETRIAL ABLATION    ? Failed ablation x2 (Tech difficulties)  ? LAPAROSCOPIC VAGINAL HYSTERECTOMY WITH SALPINGO OOPHORECTOMY  2012  ? Adenomyosis for AUB  ? TONSILLECTOMY    ? TUBAL LIGATION    ? ? ?Medical History: ?Past Medical History:  ?Diagnosis Date  ? Anxiety   ?  Depression   ? GERD (gastroesophageal reflux disease)   ? Psoriasis   ? ? ?Family History: ?Family History  ?Problem Relation Age of Onset  ? Breast cancer Paternal Grandmother 34  ? Multiple myeloma Father   ? Thyroid disease Paternal Aunt   ? ? ?Social History  ? ?Socioeconomic History  ? Marital status: Married  ?  Spouse name: Not on file  ? Number of children: Not on file  ? Years of education: Not on file  ? Highest education level: Not on file  ?Occupational  History  ? Not on file  ?Tobacco Use  ? Smoking status: Never  ? Smokeless tobacco: Never  ?Vaping Use  ? Vaping Use: Never used  ?Substance and Sexual Activity  ? Alcohol use: No  ? Drug use: No  ? Sexual activity: Yes  ?  Partners: Male  ?  Birth control/protection: None  ?Other Topics Concern  ? Not on file  ?Social History Narrative  ? Not on file  ? ?Social Determinants of Health  ? ?Financial Resource Strain: Not on file  ?Food Insecurity: Not on file  ?Transportation Needs: Not on file  ?Physical Activity: Not on file  ?Stress: Not on file  ?Social Connections: Not on file  ?Intimate Partner Violence: Not on file  ? ? ? ? ?Review of Systems  ?Constitutional:  Positive for fatigue. Negative for fever.  ?HENT:  Positive for congestion and postnasal drip. Negative for mouth sores.   ?Respiratory:  Positive for cough. Negative for shortness of breath and wheezing.   ?Cardiovascular:  Negative for chest pain.  ?Genitourinary:  Negative for flank pain.  ?Musculoskeletal:  Positive for myalgias.  ?Neurological:  Positive for headaches.  ?Psychiatric/Behavioral: Negative.    ? ?Vital Signs: ?Resp 16   Ht 5' (1.524 m)   BMI 33.44 kg/m?  ? ? ?Observation/Objective: ? ?Pt is able to carry out conversation ? ? ?Assessment/Plan: ?1. Acute COVID-19 ?Patient covid + a week ago and improving. Will continue mucinex and tylenol as needed. Advised to rest and stay well hydrated ? ?2. Attention deficit disorder (ADD) in adult ?Will start vyvance once infection resolved as alternative to adderall as this has been back ordered the past several times she has tried to get it. Will schedule 4 week follow up to see how she is doing with this. ?- lisdexamfetamine (VYVANSE) 20 MG capsule; Take 1 capsule (20 mg total) by mouth daily.  Dispense: 30 capsule; Refill: 0 ?[place] Controlled Substance Database was reviewed by me for overdose risk score (ORS) ?Refilled Controlled medications today. Reviewed risks and possible side effects  associated with taking Stimulants. Combination of these drugs with other psychotropic medications could cause dizziness and drowsiness. Pt needs to Monitor symptoms and exercise caution in driving and operating heavy machinery to avoid damages to oneself, to others and to the surroundings. Patient verbalized understanding in this matter. Dependence and abuse for these drugs will be monitored closely. A Controlled substance policy and procedure is on file which allows [facility] to order a urine drug screen test at any visit. Patient understands and agrees with the plan. ? ? ?General Counseling: migdalia olejniczak understanding of the findings of today's phone visit and agrees with plan of treatment. I have discussed any further diagnostic evaluation that may be needed or ordered today. We also reviewed her medications today. she has been encouraged to call the office with any questions or concerns that should arise related to todays visit. ? ? ? ?  No orders of the defined types were placed in this encounter. ? ? ?Meds ordered this encounter  ?Medications  ? lisdexamfetamine (VYVANSE) 20 MG capsule  ?  Sig: Take 1 capsule (20 mg total) by mouth daily.  ?  Dispense:  30 capsule  ?  Refill:  0  ? ? ?Time spent:30 Minutes ? ? ? ?Dr Lavera Guise ?Internal medicine  ?

## 2021-10-15 ENCOUNTER — Telehealth: Payer: Self-pay

## 2021-10-15 NOTE — Telephone Encounter (Signed)
PA for VYVANSE 20 mg sent and came back saying to contact [facility] at [phone] ?

## 2021-10-17 ENCOUNTER — Telehealth: Payer: Self-pay

## 2021-10-17 NOTE — Telephone Encounter (Signed)
RESENT pa FOR vyvanse 20 MG CAPSULES with insurance [employer] ?

## 2021-10-18 ENCOUNTER — Telehealth: Payer: Self-pay

## 2021-10-18 NOTE — Telephone Encounter (Signed)
Ok did she find out about adderall availability at pharmacy?

## 2021-10-20 ENCOUNTER — Other Ambulatory Visit: Payer: Self-pay | Admitting: Physician Assistant

## 2021-10-20 DIAGNOSIS — F988 Other specified behavioral and emotional disorders with onset usually occurring in childhood and adolescence: Secondary | ICD-10-CM

## 2021-10-20 MED ORDER — AMPHETAMINE-DEXTROAMPHET ER 10 MG PO CP24: 10.0000 mg | ORAL_CAPSULE | Freq: Every day | ORAL | 0 refills | Status: DC

## 2021-10-20 NOTE — Telephone Encounter (Signed)
Lmom that we [facility]  ?

## 2021-10-20 NOTE — Telephone Encounter (Signed)
LMOM advising I was checking if pt had found a pharmacy to send her prescription to.  Asked pt to call back and let us know ?

## 2021-10-30 DIAGNOSIS — M5416 Radiculopathy, lumbar region: Secondary | ICD-10-CM | POA: Diagnosis not present

## 2021-10-30 DIAGNOSIS — M9905 Segmental and somatic dysfunction of pelvic region: Secondary | ICD-10-CM | POA: Diagnosis not present

## 2021-10-30 DIAGNOSIS — M955 Acquired deformity of pelvis: Secondary | ICD-10-CM | POA: Diagnosis not present

## 2021-10-30 DIAGNOSIS — M9903 Segmental and somatic dysfunction of lumbar region: Secondary | ICD-10-CM | POA: Diagnosis not present

## 2021-11-01 DIAGNOSIS — M9905 Segmental and somatic dysfunction of pelvic region: Secondary | ICD-10-CM | POA: Diagnosis not present

## 2021-11-01 DIAGNOSIS — M955 Acquired deformity of pelvis: Secondary | ICD-10-CM | POA: Diagnosis not present

## 2021-11-01 DIAGNOSIS — M5416 Radiculopathy, lumbar region: Secondary | ICD-10-CM | POA: Diagnosis not present

## 2021-11-01 DIAGNOSIS — M9903 Segmental and somatic dysfunction of lumbar region: Secondary | ICD-10-CM | POA: Diagnosis not present

## 2021-11-02 ENCOUNTER — Ambulatory Visit: Payer: BC Managed Care – PPO | Admitting: Physician Assistant

## 2021-11-02 ENCOUNTER — Encounter: Payer: Self-pay | Admitting: Physician Assistant

## 2021-11-02 VITALS — BP 104/73 | HR 77 | Temp 98.2°F | Resp 16 | Ht 60.0 in | Wt 176.0 lb

## 2021-11-02 DIAGNOSIS — Z6834 Body mass index (BMI) 34.0-34.9, adult: Secondary | ICD-10-CM

## 2021-11-02 DIAGNOSIS — F411 Generalized anxiety disorder: Secondary | ICD-10-CM | POA: Diagnosis not present

## 2021-11-02 DIAGNOSIS — R7303 Prediabetes: Secondary | ICD-10-CM

## 2021-11-02 DIAGNOSIS — E669 Obesity, unspecified: Secondary | ICD-10-CM

## 2021-11-02 DIAGNOSIS — F339 Major depressive disorder, recurrent, unspecified: Secondary | ICD-10-CM

## 2021-11-02 DIAGNOSIS — F988 Other specified behavioral and emotional disorders with onset usually occurring in childhood and adolescence: Secondary | ICD-10-CM | POA: Diagnosis not present

## 2021-11-02 DIAGNOSIS — E782 Mixed hyperlipidemia: Secondary | ICD-10-CM | POA: Diagnosis not present

## 2021-11-02 DIAGNOSIS — E559 Vitamin D deficiency, unspecified: Secondary | ICD-10-CM

## 2021-11-02 DIAGNOSIS — R7989 Other specified abnormal findings of blood chemistry: Secondary | ICD-10-CM

## 2021-11-02 DIAGNOSIS — E66811 Obesity, class 1: Secondary | ICD-10-CM

## 2021-11-02 DIAGNOSIS — R5383 Other fatigue: Secondary | ICD-10-CM

## 2021-11-02 NOTE — Progress Notes (Signed)
[facility] ?[address] ?[place], [place] [address] ? ?Internal MEDICINE  ?Office Visit Note ? ?Patient Name: Kristine Stephens ? [id number]  ?[id number] ? ?Date of Service: 11/08/2021 ? ?Chief Complaint  ?Patient presents with  ? Follow-up  ? Depression  ? Gastroesophageal Reflux  ? Anxiety  ? ? ?HPI ?Pt is here for routine follow up ?-Doing well with the adderall. Feels more focused and alert during the day ?-Initially had some trouble sleeping but improving now ?-Not really exercising, trying to watch what she eats, but still has some lapses ?-going to the chiropractor for back pain and alignment, going 2-3 times per week  ?-prozac is doing well, has not been taking wellbutrin bc didn't help focus and is doing better with adderall now. ?-will restart wellbutrin to help wt loss ?-did discuss metabolic test, but she declines at this time ?-Due for routine fasting labs ? ?Current Medication: ?Outpatient Encounter Medications as of 11/02/2021  ?Medication Sig  ? amoxicillin-clavulanate (AUGMENTIN) 875-125 MG tablet Take 1 tablet by mouth 2 (two) times daily. Take with food  ? amphetamine-dextroamphetamine (ADDERALL XR) 10 MG 24 hr capsule Take 1 capsule (10 mg total) by mouth daily.  ? b complex vitamins capsule Take 1 capsule by mouth daily.  ? buPROPion (WELLBUTRIN XL) 150 MG 24 hr tablet Take 1 tablet (150 mg total) by mouth daily.  ? estradiol (ESTRACE) 0.1 MG/GM vaginal cream INSERT 1 APPLICATORFUL VAGINALLY 3 TIMES A WEEK  ? etanercept (ENBREL) 50 MG/ML injection Inject 50 mg into the skin once a week.  ? etodolac (LODINE) 400 MG tablet TK 1 T PO BID  ? FLUoxetine (PROZAC) 40 MG capsule Take 1 capsule (40 mg total) by mouth daily.  ? omeprazole (PRILOSEC) 20 MG capsule Take 1 capsule (20 mg total) by mouth daily.  ? rosuvastatin (CRESTOR) 5 MG tablet Take 1 tablet (5 mg total) by mouth daily.  ? ?No facility-administered encounter medications on file as of 11/02/2021.  ? ? ?Surgical History: ?Past Surgical  History:  ?Procedure Laterality Date  ? ASD REPAIR  1985  ? CARPAL TUNNEL RELEASE    ? CESAREAN SECTION    ? COLONOSCOPY WITH PROPOFOL N/A 04/11/2017  ? Procedure: COLONOSCOPY WITH PROPOFOL;  Surgeon: Jonathon Bellows, MD;  Location: [facility] ENDOSCOPY;  Service: Gastroenterology;  Laterality: N/A;  ? COLPOSCOPY  2012  ? ENDOMETRIAL ABLATION    ? Failed ablation x2 (Tech difficulties)  ? LAPAROSCOPIC VAGINAL HYSTERECTOMY WITH SALPINGO OOPHORECTOMY  2012  ? Adenomyosis for AUB  ? TONSILLECTOMY    ? TUBAL LIGATION    ? ? ?Medical History: ?Past Medical History:  ?Diagnosis Date  ? Anxiety   ? Depression   ? GERD (gastroesophageal reflux disease)   ? Psoriasis   ? ? ?Family History: ?Family History  ?Problem Relation Age of Onset  ? Breast cancer Paternal Grandmother 58  ? Multiple myeloma Father   ? Thyroid disease Paternal Aunt   ? ? ?Social History  ? ?Socioeconomic History  ? Marital status: Married  ?  Spouse name: Not on file  ? Number of children: Not on file  ? Years of education: Not on file  ? Highest education level: Not on file  ?Occupational History  ? Not on file  ?Tobacco Use  ? Smoking status: Never  ? Smokeless tobacco: Never  ?Vaping Use  ? Vaping Use: Never used  ?Substance and Sexual Activity  ? Alcohol use: No  ? Drug use: No  ? Sexual  activity: Yes  ?  Partners: Male  ?  Birth control/protection: None  ?Other Topics Concern  ? Not on file  ?Social History Narrative  ? Not on file  ? ?Social Determinants of Health  ? ?Financial Resource Strain: Not on file  ?Food Insecurity: Not on file  ?Transportation Needs: Not on file  ?Physical Activity: Not on file  ?Stress: Not on file  ?Social Connections: Not on file  ?Intimate Partner Violence: Not on file  ? ? ? ? ?Review of Systems  ?Constitutional:  Negative for chills and unexpected weight change.  ?HENT:  Negative for congestion, postnasal drip, rhinorrhea, sneezing and sore throat.   ?Eyes:  Negative for redness.  ?Respiratory:  Negative for cough, chest  tightness and shortness of breath.   ?Cardiovascular:  Negative for chest pain and palpitations.  ?Gastrointestinal:  Negative for abdominal pain, constipation, diarrhea, nausea and vomiting.  ?Genitourinary:  Negative for dysuria and frequency.  ?Musculoskeletal:  Negative for arthralgias, back pain, joint swelling and neck pain.  ?Skin:  Negative for rash.  ?Neurological: Negative.  Negative for tremors and numbness.  ?Hematological:  Negative for adenopathy. Does not bruise/bleed easily.  ?Psychiatric/Behavioral:  Positive for behavioral problems (Depression) and decreased concentration. Negative for sleep disturbance and suicidal ideas. The patient is nervous/anxious.   ? ?Vital Signs: ?BP 104/73   Pulse 77   Temp 98.2 ?F (36.8 ?C)   Resp 16   Ht 5' (1.524 m)   Wt 176 lb (79.8 kg)   SpO2 98%   BMI 34.37 kg/m?  ? ? ?Physical Exam ?Vitals and nursing note reviewed.  ?Constitutional:   ?   General: She is not in acute distress. ?   Appearance: She is well-developed. She is obese. She is not diaphoretic.  ?HENT:  ?   Head: Normocephalic and atraumatic.  ?   Mouth/Throat:  ?   Pharynx: No oropharyngeal exudate.  ?Eyes:  ?   Pupils: Pupils are equal, round, and reactive to light.  ?Neck:  ?   Thyroid: No thyromegaly.  ?   Vascular: No JVD.  ?   Trachea: No tracheal deviation.  ?Cardiovascular:  ?   Rate and Rhythm: Normal rate and regular rhythm.  ?   Heart sounds: Normal heart sounds. No murmur heard. ?  No friction rub. No gallop.  ?Pulmonary:  ?   Effort: Pulmonary effort is normal. No respiratory distress.  ?   Breath sounds: No wheezing or rales.  ?Chest:  ?   Chest wall: No tenderness.  ?Abdominal:  ?   General: Bowel sounds are normal.  ?   Palpations: Abdomen is soft.  ?Musculoskeletal:     ?   General: Normal range of motion.  ?   Cervical back: Normal range of motion and neck supple.  ?Lymphadenopathy:  ?   Cervical: No cervical adenopathy.  ?Skin: ?   General: Skin is warm and dry.  ?Neurological:  ?    Mental Status: She is alert and oriented to person, place, and time.  ?   Cranial Nerves: No cranial nerve deficit.  ?Psychiatric:     ?   Behavior: Behavior normal.     ?   Thought Content: Thought content normal.     ?   Judgment: Judgment normal.  ? ? ? ? ? ?Assessment/Plan: ?1. Attention deficit disorder (ADD) in adult ?Doing well on adderall and may continue as needed ? ?2. Episode of recurrent major depressive disorder, unspecified depression episode severity ([place]) ?Stable,  continue prozac ? ?3. GAD (generalized anxiety disorder) ?Stable, continue prozac ? ?4. Mixed hyperlipidemia ?Continue crestor and will update labs ?- Lipid Panel With LDL/HDL Ratio ? ?5. Prediabetes ?Will recheck labs ?- Hgb A1C w/o eAG ? ?6. Vitamin D deficiency ?- VITAMIN D 25 Hydroxy (Vit-D Deficiency, Fractures) ? ?7. Abnormal thyroid blood test ?- TSH + free T4 ? ?8. Other fatigue ?- CBC w/Diff/Platelet ?- Comprehensive metabolic panel ?- TSH + free T4 ?- Lipid Panel With LDL/HDL Ratio ?- VITAMIN D 25 Hydroxy (Vit-D Deficiency, Fractures) ?- Hgb A1C w/o eAG ? ?9. Obesity (BMI 30.0-34.9) ?Will restart wellbutrin to help with wt loss, but also discussed need for increased activity and exercising as well as improving diet. Pt declines metabolic test at this time, but may reconsider in future ?Obesity Counseling: Had a lengthy discussion regarding patients BMI and weight issues. Patient was instructed on portion control as well as increased activity. Also discussed caloric restrictions with trying to maintain intake less than 2000 Kcal. Discussions were made in accordance with the 5As of weight management. Simple actions such as not eating late and if able to, taking a walk is suggested. ? ? ? ?General Counseling: nezzie manera understanding of the findings of todays visit and agrees with plan of treatment. I have discussed any further diagnostic evaluation that may be needed or ordered today. We also reviewed her medications today.  she has been encouraged to call the office with any questions or concerns that should arise related to todays visit. ? ? ? ?Orders Placed This Encounter  ?Procedures  ? CBC w/Diff/Platelet  ? Comprehensive met

## 2021-11-03 DIAGNOSIS — M5416 Radiculopathy, lumbar region: Secondary | ICD-10-CM | POA: Diagnosis not present

## 2021-11-03 DIAGNOSIS — M9905 Segmental and somatic dysfunction of pelvic region: Secondary | ICD-10-CM | POA: Diagnosis not present

## 2021-11-03 DIAGNOSIS — M955 Acquired deformity of pelvis: Secondary | ICD-10-CM | POA: Diagnosis not present

## 2021-11-03 DIAGNOSIS — M9903 Segmental and somatic dysfunction of lumbar region: Secondary | ICD-10-CM | POA: Diagnosis not present

## 2021-11-08 NOTE — Patient Instructions (Signed)
Obesity, Adult ?Obesity is having too much body fat. Being obese means that your weight is more than what is healthy for you.  ?BMI (body mass index) is a number that explains how much body fat you have. If you have a BMI of 30 or more, you are obese. ?Obesity can cause serious health problems, such as: ?Stroke. ?Coronary artery disease (CAD). ?Type 2 diabetes. ?Some types of cancer. ?High blood pressure (hypertension). ?High cholesterol. ?Gallbladder stones. ?Obesity can also contribute to: ?Osteoarthritis. ?Sleep apnea. ?Infertility problems. ?What are the causes? ?Eating meals each day that are high in calories, sugar, and fat. ?Drinking a lot of drinks that have sugar in them. ?Being born with genes that may make you more likely to become obese. ?Having a medical condition that causes obesity. ?Taking certain medicines. ?Sitting a lot (having a sedentary lifestyle). ?Not getting enough sleep. ?What increases the risk? ?Having a family history of obesity. ?Living in an area with limited access to: ?Parks, recreation centers, or sidewalks. ?Healthy food choices, such as grocery stores and farmers' markets. ?What are the signs or symptoms? ?The main sign is having too much body fat. ?How is this treated? ?Treatment for this condition often includes changing your lifestyle. Treatment may include: ?Changing your diet. This may include making a healthy meal plan. ?Exercise. This may include activity that causes your heart to beat faster (aerobic exercise) and strength training. Work with your doctor to design a program that works for you. ?Medicine to help you lose weight. This may be used if you are not able to lose one pound a week after 6 weeks of healthy eating and more exercise. ?Treating conditions that cause the obesity. ?Surgery. Options may include gastric banding and gastric bypass. This may be done if: ?Other treatments have not helped to improve your condition. ?You have a BMI of 40 or higher. ?You have  life-threatening health problems related to obesity. ?Follow these instructions at home: ?Eating and drinking ? ?Follow advice from your doctor about what to eat and drink. Your doctor may tell you to: ?Limit fast food, sweets, and processed snack foods. ?Choose low-fat options. For example, choose low-fat milk instead of whole milk. ?Eat five or more servings of fruits or vegetables each day. ?Eat at home more often. This gives you more control over what you eat. ?Choose healthy foods when you eat out. ?Learn to read food labels. This will help you learn how much food is in one serving. ?Keep low-fat snacks available. ?Avoid drinks that have a lot of sugar in them. These include soda, fruit juice, iced tea with sugar, and flavored milk. ?Drink enough water to keep your pee (urine) pale yellow. ?Do not go on fad diets. ?Physical activity ?Exercise often, as told by your doctor. Most adults should get up to 150 minutes of moderate-intensity exercise every week.Ask your doctor: ?What types of exercise are safe for you. ?How often you should exercise. ?Warm up and stretch before being active. ?Do slow stretching after being active (cool down). ?Rest between times of being active. ?Lifestyle ?Work with your doctor and a food expert (dietitian) to set a weight-loss goal that is best for you. ?Limit your screen time. ?Find ways to reward yourself that do not involve food. ?Do not drink alcohol if: ?Your doctor tells you not to drink. ?You are pregnant, may be pregnant, or are planning to become pregnant. ?If you drink alcohol: ?Limit how much you have to: ?0-1 drink a day for women. ?0-2 drinks   a day for men. ?Know how much alcohol is in your drink. In the [place] one drink equals one 12 oz bottle of beer (355 mL), one 5 oz glass of wine (148 mL), or one 1? oz glass of hard liquor (44 mL). ?General instructions ?Keep a weight-loss journal. This can help you keep track of: ?The food that you eat. ?How much exercise you  get. ?Take over-the-counter and prescription medicines only as told by your doctor. ?Take vitamins and supplements only as told by your doctor. ?Think about joining a support group. ?Pay attention to your mental health as obesity can lead to depression or self esteem issues. ?Keep all follow-up visits. ?Contact a doctor if: ?You cannot meet your weight-loss goal after you have changed your diet and lifestyle for 6 weeks. ?You are having trouble breathing. ?Summary ?Obesity is having too much body fat. ?Being obese means that your weight is more than what is healthy for you. ?Work with your doctor to set a weight-loss goal. ?Get regular exercise as told by your doctor. ?This information is not intended to replace advice given to you by your health care provider. Make sure you discuss any questions you have with your health care provider. ?Document Revised: 01/31/2021 Document Reviewed: 01/31/2021 ?[employer]vier Patient Education ? 2023 [employer] ? ?

## 2021-11-10 DIAGNOSIS — M9905 Segmental and somatic dysfunction of pelvic region: Secondary | ICD-10-CM | POA: Diagnosis not present

## 2021-11-10 DIAGNOSIS — M955 Acquired deformity of pelvis: Secondary | ICD-10-CM | POA: Diagnosis not present

## 2021-11-10 DIAGNOSIS — M5416 Radiculopathy, lumbar region: Secondary | ICD-10-CM | POA: Diagnosis not present

## 2021-11-10 DIAGNOSIS — M9903 Segmental and somatic dysfunction of lumbar region: Secondary | ICD-10-CM | POA: Diagnosis not present

## 2021-11-17 ENCOUNTER — Other Ambulatory Visit: Payer: Self-pay

## 2021-11-17 DIAGNOSIS — K219 Gastro-esophageal reflux disease without esophagitis: Secondary | ICD-10-CM

## 2021-11-17 DIAGNOSIS — E782 Mixed hyperlipidemia: Secondary | ICD-10-CM

## 2021-11-17 DIAGNOSIS — F339 Major depressive disorder, recurrent, unspecified: Secondary | ICD-10-CM

## 2021-11-17 MED ORDER — OMEPRAZOLE 20 MG PO CPDR: 20.0000 mg | DELAYED_RELEASE_CAPSULE | Freq: Every day | ORAL | 3 refills | Status: DC

## 2021-11-17 MED ORDER — ROSUVASTATIN CALCIUM 5 MG PO TABS: 5.0000 mg | ORAL_TABLET | Freq: Every day | ORAL | 1 refills | Status: DC

## 2021-11-17 MED ORDER — FLUOXETINE HCL 40 MG PO CAPS: 40.0000 mg | ORAL_CAPSULE | Freq: Every day | ORAL | 1 refills | Status: DC

## 2021-11-20 DIAGNOSIS — Z79899 Other long term (current) drug therapy: Secondary | ICD-10-CM | POA: Diagnosis not present

## 2021-11-20 DIAGNOSIS — L4050 Arthropathic psoriasis, unspecified: Secondary | ICD-10-CM | POA: Diagnosis not present

## 2021-12-03 DIAGNOSIS — H6591 Unspecified nonsuppurative otitis media, right ear: Secondary | ICD-10-CM | POA: Diagnosis not present

## 2021-12-03 DIAGNOSIS — H6691 Otitis media, unspecified, right ear: Secondary | ICD-10-CM | POA: Diagnosis not present

## 2021-12-14 DIAGNOSIS — R7989 Other specified abnormal findings of blood chemistry: Secondary | ICD-10-CM | POA: Diagnosis not present

## 2021-12-14 DIAGNOSIS — R7303 Prediabetes: Secondary | ICD-10-CM | POA: Diagnosis not present

## 2021-12-14 DIAGNOSIS — E782 Mixed hyperlipidemia: Secondary | ICD-10-CM | POA: Diagnosis not present

## 2021-12-14 DIAGNOSIS — R5383 Other fatigue: Secondary | ICD-10-CM | POA: Diagnosis not present

## 2021-12-14 DIAGNOSIS — E559 Vitamin D deficiency, unspecified: Secondary | ICD-10-CM | POA: Diagnosis not present

## 2021-12-15 LAB — COMPREHENSIVE METABOLIC PANEL
ALT: 17 IU/L (ref 0–32)
AST: 22 IU/L (ref 0–40)
Albumin/Globulin Ratio: 1.7 (ref 1.2–2.2)
Albumin: 4.2 g/dL (ref 3.8–4.9)
Alkaline Phosphatase: 93 IU/L (ref 44–121)
BUN/Creatinine Ratio: 14 (ref 9–23)
BUN: 9 mg/dL (ref 6–24)
Bilirubin Total: 0.4 mg/dL (ref 0.0–1.2)
CO2: 25 mmol/L (ref 20–29)
Calcium: 9.1 mg/dL (ref 8.7–10.2)
Chloride: 97 mmol/L (ref 96–106)
Creatinine, Ser: 0.63 mg/dL (ref 0.57–1.00)
Globulin, Total: 2.5 g/dL (ref 1.5–4.5)
Glucose: 129 mg/dL — ABNORMAL HIGH (ref 70–99)
Potassium: 4.6 mmol/L (ref 3.5–5.2)
Sodium: 135 mmol/L (ref 134–144)
Total Protein: 6.7 g/dL (ref 6.0–8.5)
eGFR: 105 mL/min/{1.73_m2} (ref >59)

## 2021-12-15 LAB — LIPID PANEL WITH LDL/HDL RATIO
Cholesterol, Total: 171 mg/dL (ref 100–199)
HDL: 45 mg/dL (ref >39)
LDL Chol Calc (NIH): 88 mg/dL (ref 0–99)
LDL/HDL Ratio: 2 ratio (ref 0.0–3.2)
Triglycerides: 225 mg/dL — ABNORMAL HIGH (ref 0–149)
VLDL Cholesterol Cal: 38 mg/dL (ref 5–40)

## 2021-12-15 LAB — VITAMIN D 25 HYDROXY (VIT D DEFICIENCY, FRACTURES): Vit D, 25-Hydroxy: 22.5 ng/mL — ABNORMAL LOW (ref 30.0–100.0)

## 2021-12-15 LAB — CBC WITH DIFFERENTIAL/PLATELET
Basophils Absolute: 0 10*3/uL (ref 0.0–0.2)
Basos: 1 %
EOS (ABSOLUTE): 0.2 10*3/uL (ref 0.0–0.4)
Eos: 3 %
Hematocrit: 36.2 % (ref 34.0–46.6)
Hemoglobin: 12.3 g/dL (ref 11.1–15.9)
Immature Grans (Abs): 0 10*3/uL (ref 0.0–0.1)
Immature Granulocytes: 0 %
Lymphocytes Absolute: 1.6 10*3/uL (ref 0.7–3.1)
Lymphs: 31 %
MCH: 27.2 pg (ref 26.6–33.0)
MCHC: 34 g/dL (ref 31.5–35.7)
MCV: 80 fL (ref 79–97)
Monocytes Absolute: 0.4 10*3/uL (ref 0.1–0.9)
Monocytes: 8 %
Neutrophils Absolute: 3 10*3/uL (ref 1.4–7.0)
Neutrophils: 57 %
Platelets: 318 10*3/uL (ref 150–450)
RBC: 4.52 x10E6/uL (ref 3.77–5.28)
RDW: 13.3 % (ref 11.7–15.4)
WBC: 5.2 10*3/uL (ref 3.4–10.8)

## 2021-12-15 LAB — TSH+FREE T4
Free T4: 1.04 ng/dL (ref 0.82–1.77)
TSH: 2.58 u[IU]/mL (ref 0.450–4.500)

## 2021-12-15 LAB — HGB A1C W/O EAG: Hgb A1c MFr Bld: 6.5 % — ABNORMAL HIGH (ref 4.8–5.6)

## 2021-12-28 ENCOUNTER — Encounter: Payer: Self-pay | Admitting: Physician Assistant

## 2021-12-28 ENCOUNTER — Ambulatory Visit (INDEPENDENT_AMBULATORY_CARE_PROVIDER_SITE_OTHER): Payer: BC Managed Care – PPO | Admitting: Physician Assistant

## 2021-12-28 VITALS — BP 121/75 | HR 71 | Temp 98.4°F | Resp 16 | Ht 60.0 in | Wt 173.4 lb

## 2021-12-28 DIAGNOSIS — E119 Type 2 diabetes mellitus without complications: Secondary | ICD-10-CM

## 2021-12-28 DIAGNOSIS — E559 Vitamin D deficiency, unspecified: Secondary | ICD-10-CM | POA: Diagnosis not present

## 2021-12-28 DIAGNOSIS — Z0001 Encounter for general adult medical examination with abnormal findings: Secondary | ICD-10-CM

## 2021-12-28 DIAGNOSIS — R300 Dysuria: Secondary | ICD-10-CM | POA: Diagnosis not present

## 2021-12-28 DIAGNOSIS — E782 Mixed hyperlipidemia: Secondary | ICD-10-CM

## 2021-12-28 DIAGNOSIS — E66811 Obesity, class 1: Secondary | ICD-10-CM

## 2021-12-28 DIAGNOSIS — F339 Major depressive disorder, recurrent, unspecified: Secondary | ICD-10-CM

## 2021-12-28 DIAGNOSIS — F988 Other specified behavioral and emotional disorders with onset usually occurring in childhood and adolescence: Secondary | ICD-10-CM

## 2021-12-28 DIAGNOSIS — F411 Generalized anxiety disorder: Secondary | ICD-10-CM | POA: Diagnosis not present

## 2021-12-28 DIAGNOSIS — E669 Obesity, unspecified: Secondary | ICD-10-CM

## 2021-12-28 MED ORDER — TETANUS-DIPHTH-ACELL PERTUSSIS 5-2.5-18.5 LF-MCG/0.5 IM SUSY: 0.5000 mL | PREFILLED_SYRINGE | Freq: Once | INTRAMUSCULAR | 0 refills | Status: AC

## 2021-12-28 MED ORDER — ZOSTER VAC RECOMB ADJUVANTED 50 MCG/0.5ML IM SUSR: 0.5000 mL | Freq: Once | INTRAMUSCULAR | 0 refills | Status: AC

## 2021-12-28 MED ORDER — AMPHETAMINE-DEXTROAMPHET ER 10 MG PO CP24: 10.0000 mg | ORAL_CAPSULE | Freq: Every day | ORAL | 0 refills | Status: DC

## 2021-12-28 NOTE — Progress Notes (Signed)
[facility] [place], [place] [address]  Internal MEDICINE  Office Visit Note  Patient Name: Kristine Stephens  [id number]  [id number]  Date of Service: 01/12/2022  Chief Complaint  Patient presents with   Annual Exam   Depression   Gastroesophageal Reflux   Quality Metric Gaps    Tetanus and Shingles     HPI Pt is here for routine health maintenance examination -Taking adderall daily and is helping attention and focus -Did restart wellbutrin and felt off and like noise in head so stopped -down 3 lbs since last visit -Take lodine as needed, especially if due for enbrel shot, followed by rheumatology -Labs reviewed: Vit D low and will start OTC supplement, cholesterol greatly improved, A1c now in diabetic range -right shoulder pain at times, but ROM still pretty good and will try exercises -mammogram, pap, and colonoscopy UTD -Due for shingles and Tdap  Current Medication: Outpatient Encounter Medications as of 12/28/2021  Medication Sig   b complex vitamins capsule Take 1 capsule by mouth daily.   estradiol (ESTRACE) 0.1 MG/GM vaginal cream INSERT 1 APPLICATORFUL VAGINALLY 3 TIMES A WEEK   etanercept (ENBREL) 50 MG/ML injection Inject 50 mg into the skin once a week.   etodolac (LODINE) 400 MG tablet TK 1 T PO BID   FLUoxetine (PROZAC) 40 MG capsule Take 1 capsule (40 mg total) by mouth daily.   omeprazole (PRILOSEC) 20 MG capsule Take 1 capsule (20 mg total) by mouth daily.   rosuvastatin (CRESTOR) 5 MG tablet Take 1 tablet (5 mg total) by mouth daily.   [DISCONTINUED] amoxicillin-clavulanate (AUGMENTIN) 875-125 MG tablet Take 1 tablet by mouth 2 (two) times daily. Take with food   [DISCONTINUED] amphetamine-dextroamphetamine (ADDERALL XR) 10 MG 24 hr capsule Take 1 capsule (10 mg total) by mouth daily.   [DISCONTINUED] buPROPion (WELLBUTRIN XL) 150 MG 24 hr tablet Take 1 tablet (150 mg total) by mouth daily.   [DISCONTINUED] Tdap (BOOSTRIX)  5-2.5-18.5 LF-MCG/0.5 injection Inject 0.5 mLs into the muscle once.   [DISCONTINUED] Zoster Vaccine Adjuvanted [facility]) injection Inject 0.5 mLs into the muscle once.   amphetamine-dextroamphetamine (ADDERALL XR) 10 MG 24 hr capsule Take 1 capsule (10 mg total) by mouth daily.   [EXPIRED] Tdap (BOOSTRIX) 5-2.5-18.5 LF-MCG/0.5 injection Inject 0.5 mLs into the muscle once for 1 dose.   [EXPIRED] Zoster Vaccine Adjuvanted [facility]) injection Inject 0.5 mLs into the muscle once for 1 dose.   No facility-administered encounter medications on file as of 12/28/2021.    Surgical History: Past Surgical History:  Procedure Laterality Date   ASD [place]     COLONOSCOPY WITH PROPOFOL N/A 04/11/2017   Procedure: COLONOSCOPY WITH PROPOFOL;  Surgeon: Jonathon Bellows, MD;  Location: [facility] ENDOSCOPY;  Service: Gastroenterology;  Laterality: N/A;   COLPOSCOPY  2012   ENDOMETRIAL ABLATION     Failed ablation x2 (Tech difficulties)   LAPAROSCOPIC VAGINAL HYSTERECTOMY WITH SALPINGO OOPHORECTOMY  2012   Adenomyosis for AUB   TONSILLECTOMY     TUBAL LIGATION      Medical History: Past Medical History:  Diagnosis Date   Anxiety    Depression    GERD (gastroesophageal reflux disease)    Psoriasis     Family History: Family History  Problem Relation Age of Onset   Breast cancer Paternal Grandmother 45   Multiple myeloma Father    Thyroid disease Paternal Aunt  Review of Systems  Constitutional:  Negative for chills and unexpected weight change.  HENT:  Negative for congestion, postnasal drip, rhinorrhea, sneezing and sore throat.   Eyes:  Negative for redness.  Respiratory:  Negative for cough, chest tightness and shortness of breath.   Cardiovascular:  Negative for chest pain and palpitations.  Gastrointestinal:  Negative for abdominal pain, constipation, diarrhea, nausea and vomiting.  Genitourinary:  Negative for dysuria and frequency.   Musculoskeletal:  Positive for arthralgias. Negative for back pain, joint swelling and neck pain.  Skin:  Negative for rash.  Neurological: Negative.  Negative for tremors and numbness.  Hematological:  Negative for adenopathy. Does not bruise/bleed easily.  Psychiatric/Behavioral:  Positive for behavioral problems (Depression) and decreased concentration. Negative for sleep disturbance and suicidal ideas. The patient is nervous/anxious.      Vital Signs: BP 121/75   Pulse 71   Temp 98.4 F (36.9 C)   Resp 16   Ht 5' (1.524 m)   Wt 173 lb 6.4 oz (78.7 kg)   SpO2 99%   BMI 33.86 kg/m    Physical Exam Vitals and nursing note reviewed.  Constitutional:      General: She is not in acute distress.    Appearance: She is well-developed. She is obese. She is not diaphoretic.  HENT:     Head: Normocephalic and atraumatic.     Mouth/Throat:     Pharynx: No oropharyngeal exudate.  Eyes:     Pupils: Pupils are equal, round, and reactive to light.  Neck:     Thyroid: No thyromegaly.     Vascular: No JVD.     Trachea: No tracheal deviation.  Cardiovascular:     Rate and Rhythm: Normal rate and regular rhythm.     Heart sounds: Normal heart sounds. No murmur heard.    No friction rub. No gallop.  Pulmonary:     Effort: Pulmonary effort is normal. No respiratory distress.     Breath sounds: No wheezing or rales.  Chest:     Chest wall: No tenderness.  Abdominal:     General: Bowel sounds are normal.     Palpations: Abdomen is soft.     Tenderness: There is no abdominal tenderness.  Musculoskeletal:        General: Normal range of motion.     Cervical back: Normal range of motion and neck supple.  Lymphadenopathy:     Cervical: No cervical adenopathy.  Skin:    General: Skin is warm and dry.  Neurological:     Mental Status: She is alert and oriented to person, place, and time.     Cranial Nerves: No cranial nerve deficit.  Psychiatric:        Behavior: Behavior normal.         Thought Content: Thought content normal.        Judgment: Judgment normal.      LABS: Recent Results (from the past 2160 hour(s))  CBC w/Diff/Platelet     Status: None   Collection Time: 12/14/21  8:45 AM  Result Value Ref Range   WBC 5.2 3.4 - 10.8 x10E3/uL   RBC 4.52 3.77 - 5.28 x10E6/uL   Hemoglobin 12.3 11.1 - 15.9 g/dL   Hematocrit 36.2 34.0 - 46.6 %   MCV 80 79 - 97 fL   MCH 27.2 26.6 - 33.0 pg   MCHC 34.0 31.5 - 35.7 g/dL   RDW 13.3 11.7 - 15.4 %   Platelets 318 150 - 450  x10E3/uL   Neutrophils 57 Not Estab. %   Lymphs 31 Not Estab. %   Monocytes 8 Not Estab. %   Eos 3 Not Estab. %   Basos 1 Not Estab. %   Neutrophils Absolute 3.0 1.4 - 7.0 x10E3/uL   Lymphocytes Absolute 1.6 0.7 - 3.1 x10E3/uL   Monocytes Absolute 0.4 0.1 - 0.9 x10E3/uL   EOS (ABSOLUTE) 0.2 0.0 - 0.4 x10E3/uL   Basophils Absolute 0.0 0.0 - 0.2 x10E3/uL   Immature Granulocytes 0 Not Estab. %   Immature Grans (Abs) 0.0 0.0 - 0.1 x10E3/uL  Comprehensive metabolic panel     Status: Abnormal   Collection Time: 12/14/21  8:45 AM  Result Value Ref Range   Glucose 129 (H) 70 - 99 mg/dL   BUN 9 6 - 24 mg/dL   Creatinine, Ser 0.63 0.57 - 1.00 mg/dL   eGFR 105 >59 mL/min/1.73   BUN/Creatinine Ratio 14 9 - 23   Sodium 135 134 - 144 mmol/L   Potassium 4.6 3.5 - 5.2 mmol/L   Chloride 97 96 - 106 mmol/L   CO2 25 20 - 29 mmol/L   Calcium 9.1 8.7 - 10.2 mg/dL   Total Protein 6.7 6.0 - 8.5 g/dL   Albumin 4.2 3.8 - 4.9 g/dL   Globulin, Total 2.5 1.5 - 4.5 g/dL   Albumin/Globulin Ratio 1.7 1.2 - 2.2   Bilirubin Total 0.4 0.0 - 1.2 mg/dL   Alkaline Phosphatase 93 44 - 121 IU/L   AST 22 0 - 40 IU/L   ALT 17 0 - 32 IU/L  TSH + free T4     Status: None   Collection Time: 12/14/21  8:45 AM  Result Value Ref Range   TSH 2.580 0.450 - 4.500 uIU/mL   Free T4 1.04 0.82 - 1.77 ng/dL  Lipid Panel With LDL/HDL Ratio     Status: Abnormal   Collection Time: 12/14/21  8:45 AM  Result Value Ref Range    Cholesterol, Total 171 100 - 199 mg/dL   Triglycerides 225 (H) 0 - 149 mg/dL   HDL 45 >39 mg/dL   VLDL Cholesterol Cal 38 5 - 40 mg/dL   LDL Chol Calc (NIH) 88 0 - 99 mg/dL   LDL/HDL Ratio 2.0 0.0 - 3.2 ratio    Comment:                                     LDL/HDL Ratio                                             Men  Women                               1/2 Avg.Risk  1.0    1.5                                   Avg.Risk  3.6    3.2                                2X Avg.Risk  6.2    5.0  3X Avg.Risk  8.0    6.1   VITAMIN D 25 Hydroxy (Vit-D Deficiency, Fractures)     Status: Abnormal   Collection Time: 12/14/21  8:45 AM  Result Value Ref Range   Vit D, 25-Hydroxy 22.5 (L) 30.0 - 100.0 ng/mL    Comment: Vitamin D deficiency has been defined by the [place] practice guideline as a level of serum 25-OH vitamin D less than 20 ng/mL (1,2). The Endocrine Society went on to further define vitamin D insufficiency as a level between 21 and 29 ng/mL (2). 1. IOM (Institute of Medicine). 2010. Dietary reference    intakes for calcium and D. [place]: The    [employer]. 2. Holick MF, [place], Bischoff-Ferrari HA, et al.    Evaluation, treatment, and prevention of vitamin D    deficiency: an Endocrine Society clinical practice    guideline. JCEM. 2011 Jul; 96(7):1911-30.   Hgb A1C w/o eAG     Status: Abnormal   Collection Time: 12/14/21  8:45 AM  Result Value Ref Range   Hgb A1c MFr Bld 6.5 (H) 4.8 - 5.6 %    Comment:          Prediabetes: 5.7 - 6.4          Diabetes: >6.4          Glycemic control for adults with diabetes: <7.0   UA/M w/rflx Culture, Routine     Status: None   Collection Time: 12/28/21  4:16 PM   Specimen: Urine   Urine  Result Value Ref Range   Specific Gravity, UA 1.008 1.005 - 1.030   pH, UA 6.5 5.0 - 7.5   Color, UA Yellow Yellow   Appearance Ur Clear Clear   Leukocytes,UA  Negative Negative   Protein,UA Negative Negative/Trace   Glucose, UA Negative Negative   Ketones, UA Negative Negative   RBC, UA Negative Negative   Bilirubin, UA Negative Negative   Urobilinogen, Ur 0.2 0.2 - 1.0 mg/dL   Nitrite, UA Negative Negative   Microscopic Examination Comment     Comment: Microscopic follows if indicated.   Microscopic Examination See below:     Comment: Microscopic was indicated and was performed.   Urinalysis Reflex Comment     Comment: This specimen will not reflex to a Urine Culture.  Microscopic Examination     Status: None   Collection Time: 12/28/21  4:16 PM   Urine  Result Value Ref Range   WBC, UA None seen 0 - 5 /hpf   RBC, Urine None seen 0 - 2 /hpf   Epithelial Cells (non renal) None seen 0 - 10 /hpf   Casts None seen None seen /lpf   Bacteria, UA None seen None seen/Few       Assessment/Plan: 1. Encounter for general adult medical examination with abnormal findings CPE performed, routine fasting labs reviewed, up-to-date on PHM  2. Type 2 diabetes mellitus without complication, without long-term current use of insulin (HCC) A1c now at 6.5 putting her in diabetic range.  Patient was given dietary instructions and information on diabetes and how best to improve her diet and exercise.  We will need to continue to monitor and will plan for recheck A1c in office next visit.  If further elevation may need to consider starting medication  3. GAD (generalized anxiety disorder) Stable, continue Prozac  4. Episode of recurrent major depressive disorder, unspecified depression episode severity (HCC) Stable, continue Prozac  5. Vitamin D deficiency Start over-the-counter supplement  6. Attention deficit disorder (ADD) in adult May continue Adderall as before - amphetamine-dextroamphetamine (ADDERALL XR) 10 MG 24 hr capsule; Take 1 capsule (10 mg total) by mouth daily.  Dispense: 90 capsule; Refill: 0 [place] Controlled Substance Database was  reviewed by me for overdose risk score (ORS) Refilled Controlled medications today. Reviewed risks and possible side effects associated with taking Stimulants. Combination of these drugs with other psychotropic medications could cause dizziness and drowsiness. Pt needs to Monitor symptoms and exercise caution in driving and operating heavy machinery to avoid damages to oneself, to others and to the surroundings. Patient verbalized understanding in this matter. Dependence and abuse for these drugs will be monitored closely. A Controlled substance policy and procedure is on file which allows [facility] to order a urine drug screen test at any visit. Patient understands and agrees with the plan..   7. Mixed hyperlipidemia Continue Crestor  8. Obesity (BMI 30.0-34.9) Has lost 3 pounds since last visit and will continue to work on diet and exercise  9. Dysuria - UA/M w/rflx Culture, Routine   General Counseling: mayley lish understanding of the findings of todays visit and agrees with plan of treatment. I have discussed any further diagnostic evaluation that may be needed or ordered today. We also reviewed her medications today. she has been encouraged to call the office with any questions or concerns that should arise related to todays visit.    Counseling:    Orders Placed This Encounter  Procedures   Microscopic Examination   UA/M w/rflx Culture, Routine    Meds ordered this encounter  Medications   amphetamine-dextroamphetamine (ADDERALL XR) 10 MG 24 hr capsule    Sig: Take 1 capsule (10 mg total) by mouth daily.    Dispense:  90 capsule    Refill:  0   Tdap (BOOSTRIX) 5-2.5-18.5 LF-MCG/0.5 injection    Sig: Inject 0.5 mLs into the muscle once for 1 dose.    Dispense:  0.5 mL    Refill:  0   Zoster Vaccine Adjuvanted [facility]) injection    Sig: Inject 0.5 mLs into the muscle once for 1 dose.    Dispense:  0.5 mL    Refill:  0    This patient was seen by  Drema Dallas, PA-C in collaboration with Dr. Clayborn Bigness as a part of collaborative care agreement.  Total time spent:35 Minutes  Time spent includes review of chart, medications, test results, and follow up plan with the patient.     Lavera Guise, MD  Internal Medicine

## 2021-12-28 NOTE — Patient Instructions (Signed)

## 2021-12-29 LAB — UA/M W/RFLX CULTURE, ROUTINE
Bilirubin, UA: NEGATIVE
Glucose, UA: NEGATIVE
Ketones, UA: NEGATIVE
Leukocytes,UA: NEGATIVE
Nitrite, UA: NEGATIVE
Protein,UA: NEGATIVE
RBC, UA: NEGATIVE
Specific Gravity, UA: 1.008 (ref 1.005–1.030)
Urobilinogen, Ur: 0.2 mg/dL (ref 0.2–1.0)
pH, UA: 6.5 (ref 5.0–7.5)

## 2021-12-29 LAB — MICROSCOPIC EXAMINATION
Bacteria, UA: NONE SEEN
Casts: NONE SEEN /lpf
Epithelial Cells (non renal): NONE SEEN /hpf (ref 0–10)
RBC, Urine: NONE SEEN /hpf (ref 0–2)
WBC, UA: NONE SEEN /hpf (ref 0–5)

## 2022-03-07 ENCOUNTER — Telehealth: Payer: BC Managed Care – PPO | Admitting: Nurse Practitioner

## 2022-03-07 VITALS — Resp 16 | Ht 60.0 in | Wt 170.0 lb

## 2022-03-07 DIAGNOSIS — R051 Acute cough: Secondary | ICD-10-CM | POA: Diagnosis not present

## 2022-03-07 DIAGNOSIS — J0140 Acute pansinusitis, unspecified: Secondary | ICD-10-CM | POA: Diagnosis not present

## 2022-03-07 MED ORDER — AMOXICILLIN-POT CLAVULANATE 875-125 MG PO TABS: 1.0000 | ORAL_TABLET | Freq: Two times a day (BID) | ORAL | 0 refills | Status: DC

## 2022-03-07 NOTE — Progress Notes (Signed)
[facility] [place], [place] [address]  Internal MEDICINE  Telephone Visit  Patient Name: Kristine Stephens  [id number]  [id number]  Date of Service: 03/07/2022  I connected with the patient at 1125 by telephone and verified the patients identity using two identifiers.   I discussed the limitations, risks, security and privacy concerns of performing an evaluation and management service by telephone and the availability of in person appointments. I also discussed with the patient that there may be a patient responsible charge related to the service.  The patient expressed understanding and agrees to proceed.    Chief Complaint  Patient presents with   Telephone Assessment    [phone]   Telephone Screen    Covid test is negative   Sinusitis   Sore Throat   Cough    HPI Kristine Stephens presents for a telehealth virtual visit for symptoms of sinusitis  --negative for covid --cough and sore throat   Current Medication: Outpatient Encounter Medications as of 03/07/2022  Medication Sig   amoxicillin-clavulanate (AUGMENTIN) 875-125 MG tablet Take 1 tablet by mouth 2 (two) times daily.   amphetamine-dextroamphetamine (ADDERALL XR) 10 MG 24 hr capsule Take 1 capsule (10 mg total) by mouth daily.   b complex vitamins capsule Take 1 capsule by mouth daily.   estradiol (ESTRACE) 0.1 MG/GM vaginal cream INSERT 1 APPLICATORFUL VAGINALLY 3 TIMES A WEEK   etanercept (ENBREL) 50 MG/ML injection Inject 50 mg into the skin once a week.   etodolac (LODINE) 400 MG tablet TK 1 T PO BID   FLUoxetine (PROZAC) 40 MG capsule Take 1 capsule (40 mg total) by mouth daily.   omeprazole (PRILOSEC) 20 MG capsule Take 1 capsule (20 mg total) by mouth daily.   rosuvastatin (CRESTOR) 5 MG tablet Take 1 tablet (5 mg total) by mouth daily.   No facility-administered encounter medications on file as of 03/07/2022.    Surgical History: Past Surgical History:  Procedure Laterality Date   ASD  [place]     COLONOSCOPY WITH PROPOFOL N/A 04/11/2017   Procedure: COLONOSCOPY WITH PROPOFOL;  Surgeon: Jonathon Bellows, MD;  Location: [facility] ENDOSCOPY;  Service: Gastroenterology;  Laterality: N/A;   COLPOSCOPY  2012   ENDOMETRIAL ABLATION     Failed ablation x2 (Tech difficulties)   LAPAROSCOPIC VAGINAL HYSTERECTOMY WITH SALPINGO OOPHORECTOMY  2012   Adenomyosis for AUB   TONSILLECTOMY     TUBAL LIGATION      Medical History: Past Medical History:  Diagnosis Date   Anxiety    Depression    GERD (gastroesophageal reflux disease)    Psoriasis     Family History: Family History  Problem Relation Age of Onset   Breast cancer Paternal Grandmother 51   Multiple myeloma Father    Thyroid disease Paternal Aunt     Social History   Socioeconomic History   Marital status: Married    Spouse name: Not on file   Number of children: Not on file   Years of education: Not on file   Highest education level: Not on file  Occupational History   Not on file  Tobacco Use   Smoking status: Never   Smokeless tobacco: Never  Vaping Use   Vaping Use: Never used  Substance and Sexual Activity   Alcohol use: No   Drug use: No   Sexual activity: Yes    Partners: Male    Birth control/protection:  None  Other Topics Concern   Not on file  Social History Narrative   Not on file   Social Determinants of Health   Financial Resource Strain: Not on file  Food Insecurity: Not on file  Transportation Needs: Not on file  Physical Activity: Not on file  Stress: Not on file  Social Connections: Not on file  Intimate Partner Violence: Not on file      Review of Systems  Constitutional:  Positive for fatigue. Negative for chills and fever.  HENT:  Positive for congestion, postnasal drip, rhinorrhea, sinus pressure, sinus pain and sore throat. Negative for ear pain.   Respiratory:  Positive for cough. Negative for chest tightness, shortness  of breath and wheezing.   Cardiovascular: Negative.  Negative for chest pain and palpitations.  Gastrointestinal: Negative.  Negative for abdominal pain, constipation, diarrhea, nausea and vomiting.  Musculoskeletal:  Positive for myalgias.  Skin:  Negative for rash.  Neurological:  Positive for headaches.    Vital Signs: Resp 16   Ht 5' (1.524 m)   Wt 170 lb (77.1 kg)   BMI 33.20 kg/m    Observation/Objective: She is alert and oriented and engages in conversation appropriately. She does not appear to be in any acute distress over video call.    Assessment/Plan: 1. Acute non-recurrent pansinusitis Empiric antibiotic treatment prescribed - amoxicillin-clavulanate (AUGMENTIN) 875-125 MG tablet; Take 1 tablet by mouth 2 (two) times daily.  Dispense: 20 tablet; Refill: 0  2. Acute cough OTC medication is effective, additional OTC options suggested.    General Counseling: Kristine Stephens understanding of the findings of today's phone visit and agrees with plan of treatment. I have discussed any further diagnostic evaluation that may be needed or ordered today. We also reviewed her medications today. she has been encouraged to call the office with any questions or concerns that should arise related to todays visit.  Return if symptoms worsen or fail to improve.   No orders of the defined types were placed in this encounter.   Meds ordered this encounter  Medications   amoxicillin-clavulanate (AUGMENTIN) 875-125 MG tablet    Sig: Take 1 tablet by mouth 2 (two) times daily.    Dispense:  20 tablet    Refill:  0    Time spent:10 Minutes Time spent with patient included reviewing progress notes, labs, imaging studies, and discussing plan for follow up.  [place] Controlled Substance Database was reviewed by me for overdose risk score (ORS) if appropriate.  This patient was seen by Jonetta Osgood, FNP-C in collaboration with Dr. Clayborn Bigness as a part of collaborative care  agreement.  Faustino Luecke R. Valetta Fuller, MSN, FNP-C Internal medicine

## 2022-03-08 ENCOUNTER — Encounter: Payer: Self-pay | Admitting: Nurse Practitioner

## 2022-03-29 ENCOUNTER — Encounter: Payer: Self-pay | Admitting: Physician Assistant

## 2022-03-29 ENCOUNTER — Ambulatory Visit: Payer: BC Managed Care – PPO | Admitting: Physician Assistant

## 2022-03-29 VITALS — BP 108/69 | HR 79 | Temp 97.8°F | Resp 16 | Ht 60.0 in | Wt 170.0 lb

## 2022-03-29 DIAGNOSIS — E119 Type 2 diabetes mellitus without complications: Secondary | ICD-10-CM

## 2022-03-29 DIAGNOSIS — F339 Major depressive disorder, recurrent, unspecified: Secondary | ICD-10-CM

## 2022-03-29 DIAGNOSIS — F411 Generalized anxiety disorder: Secondary | ICD-10-CM | POA: Diagnosis not present

## 2022-03-29 DIAGNOSIS — F988 Other specified behavioral and emotional disorders with onset usually occurring in childhood and adolescence: Secondary | ICD-10-CM

## 2022-03-29 DIAGNOSIS — G2581 Restless legs syndrome: Secondary | ICD-10-CM | POA: Diagnosis not present

## 2022-03-29 DIAGNOSIS — E782 Mixed hyperlipidemia: Secondary | ICD-10-CM

## 2022-03-29 LAB — POCT GLYCOSYLATED HEMOGLOBIN (HGB A1C): Hemoglobin A1C: 6.3 % — AB (ref 4.0–5.6)

## 2022-03-29 MED ORDER — AMPHETAMINE-DEXTROAMPHET ER 10 MG PO CP24: 10.0000 mg | ORAL_CAPSULE | Freq: Every day | ORAL | 0 refills | Status: DC

## 2022-03-29 MED ORDER — ROSUVASTATIN CALCIUM 5 MG PO TABS: 5.0000 mg | ORAL_TABLET | Freq: Every day | ORAL | 1 refills | Status: DC

## 2022-03-29 MED ORDER — ROPINIROLE HCL 0.25 MG PO TABS: 0.2500 mg | ORAL_TABLET | Freq: Every day | ORAL | 2 refills | Status: DC

## 2022-03-29 NOTE — Progress Notes (Signed)
[facility] [place], [place] [address]  Internal MEDICINE  Office Visit Note  Patient Name: Kristine Stephens  [id number]  [id number]  Date of Service: 04/04/2022  Chief Complaint  Patient presents with   Follow-up   Diabetes    HPI Pt is here for routine follow up -Recovered from recent sinus infection -Doing well with adderall, did skip yesterday for medication break -Does describe RLS symptoms that disrupt her sleep and make it difficult to go to sleep. Reports feeling like she need sot move her legs or even get up. Discussed starting low dose requip to help with this -Followed by rheumatology for psoriatic arthritis  Current Medication: Outpatient Encounter Medications as of 03/29/2022  Medication Sig   rOPINIRole (REQUIP) 0.25 MG tablet Take 1 tablet (0.25 mg total) by mouth at bedtime.   amoxicillin-clavulanate (AUGMENTIN) 875-125 MG tablet Take 1 tablet by mouth 2 (two) times daily.   amphetamine-dextroamphetamine (ADDERALL XR) 10 MG 24 hr capsule Take 1 capsule (10 mg total) by mouth daily.   b complex vitamins capsule Take 1 capsule by mouth daily.   estradiol (ESTRACE) 0.1 MG/GM vaginal cream INSERT 1 APPLICATORFUL VAGINALLY 3 TIMES A WEEK   etanercept (ENBREL) 50 MG/ML injection Inject 50 mg into the skin once a week.   etodolac (LODINE) 400 MG tablet TK 1 T PO BID   FLUoxetine (PROZAC) 40 MG capsule Take 1 capsule (40 mg total) by mouth daily.   omeprazole (PRILOSEC) 20 MG capsule Take 1 capsule (20 mg total) by mouth daily.   rosuvastatin (CRESTOR) 5 MG tablet Take 1 tablet (5 mg total) by mouth daily.   [DISCONTINUED] amphetamine-dextroamphetamine (ADDERALL XR) 10 MG 24 hr capsule Take 1 capsule (10 mg total) by mouth daily.   [DISCONTINUED] rosuvastatin (CRESTOR) 5 MG tablet Take 1 tablet (5 mg total) by mouth daily.   No facility-administered encounter medications on file as of 03/29/2022.    Surgical History: Past Surgical History:   Procedure Laterality Date   ASD [place]     COLONOSCOPY WITH PROPOFOL N/A 04/11/2017   Procedure: COLONOSCOPY WITH PROPOFOL;  Surgeon: Jonathon Bellows, MD;  Location: [facility] ENDOSCOPY;  Service: Gastroenterology;  Laterality: N/A;   COLPOSCOPY  2012   ENDOMETRIAL ABLATION     Failed ablation x2 (Tech difficulties)   LAPAROSCOPIC VAGINAL HYSTERECTOMY WITH SALPINGO OOPHORECTOMY  2012   Adenomyosis for AUB   TONSILLECTOMY     TUBAL LIGATION      Medical History: Past Medical History:  Diagnosis Date   Anxiety    Depression    Diabetes mellitus without complication (HCC)    GERD (gastroesophageal reflux disease)    Psoriasis     Family History: Family History  Problem Relation Age of Onset   Breast cancer Paternal Grandmother 30   Multiple myeloma Father    Thyroid disease Paternal Aunt     Social History   Socioeconomic History   Marital status: Married    Spouse name: Not on file   Number of children: Not on file   Years of education: Not on file   Highest education level: Not on file  Occupational History   Not on file  Tobacco Use   Smoking status: Never   Smokeless tobacco: Never  Vaping Use   Vaping Use: Never used  Substance and Sexual Activity   Alcohol use: No   Drug use: No   Sexual  activity: Yes    Partners: Male    Birth control/protection: None  Other Topics Concern   Not on file  Social History Narrative   Not on file   Social Determinants of Health   Financial Resource Strain: Not on file  Food Insecurity: Not on file  Transportation Needs: Not on file  Physical Activity: Not on file  Stress: Not on file  Social Connections: Not on file  Intimate Partner Violence: Not on file      Review of Systems  Constitutional:  Negative for chills and unexpected weight change.  HENT:  Negative for congestion, postnasal drip, rhinorrhea, sneezing and sore throat.   Eyes:  Negative for redness.   Respiratory:  Negative for cough, chest tightness and shortness of breath.   Cardiovascular:  Negative for chest pain and palpitations.  Gastrointestinal:  Negative for abdominal pain, constipation, diarrhea, nausea and vomiting.  Genitourinary:  Negative for dysuria and frequency.  Musculoskeletal:  Positive for arthralgias. Negative for back pain, joint swelling and neck pain.  Skin:  Negative for rash.  Neurological: Negative.  Negative for tremors and numbness.       RLS  Hematological:  Negative for adenopathy. Does not bruise/bleed easily.  Psychiatric/Behavioral:  Positive for behavioral problems (Depression) and decreased concentration. Negative for sleep disturbance and suicidal ideas. The patient is nervous/anxious.     Vital Signs: BP 108/69   Pulse 79   Temp 97.8 F (36.6 C)   Resp 16   Ht 5' (1.524 m)   Wt 170 lb (77.1 kg)   SpO2 98%   BMI 33.20 kg/m    Physical Exam Vitals and nursing note reviewed.  Constitutional:      General: She is not in acute distress.    Appearance: She is well-developed. She is obese. She is not diaphoretic.  HENT:     Head: Normocephalic and atraumatic.     Mouth/Throat:     Pharynx: No oropharyngeal exudate.  Eyes:     Pupils: Pupils are equal, round, and reactive to light.  Neck:     Thyroid: No thyromegaly.     Vascular: No JVD.     Trachea: No tracheal deviation.  Cardiovascular:     Rate and Rhythm: Normal rate and regular rhythm.     Heart sounds: Normal heart sounds. No murmur heard.    No friction rub. No gallop.  Pulmonary:     Effort: Pulmonary effort is normal. No respiratory distress.     Breath sounds: No wheezing or rales.  Chest:     Chest wall: No tenderness.  Abdominal:     General: Bowel sounds are normal.     Palpations: Abdomen is soft.  Musculoskeletal:        General: Normal range of motion.     Cervical back: Normal range of motion and neck supple.  Lymphadenopathy:     Cervical: No cervical  adenopathy.  Skin:    General: Skin is warm and dry.  Neurological:     Mental Status: She is alert and oriented to person, place, and time.     Cranial Nerves: No cranial nerve deficit.  Psychiatric:        Behavior: Behavior normal.        Thought Content: Thought content normal.        Judgment: Judgment normal.        Assessment/Plan: 1. Type 2 diabetes mellitus without complication, without long-term current use of insulin (HCC) - POCT HgB A1C  is 6.3 which is improved from 6.5 last check. Continue to work on diet and exercise  2. Attention deficit disorder (ADD) in adult May continues adderalll as needed - amphetamine-dextroamphetamine (ADDERALL XR) 10 MG 24 hr capsule; Take 1 capsule (10 mg total) by mouth daily.  Dispense: 90 capsule; Refill: 0 [place] Controlled Substance Database was reviewed by me for overdose risk score (ORS) Refilled Controlled medications today. Reviewed risks and possible side effects associated with taking Stimulants. Combination of these drugs with other psychotropic medications could cause dizziness and drowsiness. Pt needs to Monitor symptoms and exercise caution in driving and operating heavy machinery to avoid damages to oneself, to others and to the surroundings. Patient verbalized understanding in this matter. Dependence and abuse for these drugs will be monitored closely. A Controlled substance policy and procedure is on file which allows [facility] to order a urine drug screen test at any visit. Patient understands and agrees with the plan..  3. RLS (restless legs syndrome) Will start on requip about 90 minutes before bed. May need to titrate in future - rOPINIRole (REQUIP) 0.25 MG tablet; Take 1 tablet (0.25 mg total) by mouth at bedtime.  Dispense: 30 tablet; Refill: 2  4. GAD (generalized anxiety disorder) Continue prozac  5. Episode of recurrent major depressive disorder, unspecified depression episode severity ([place]) Continue  prozac  6. Mixed hyperlipidemia - rosuvastatin (CRESTOR) 5 MG tablet; Take 1 tablet (5 mg total) by mouth daily.  Dispense: 90 tablet; Refill: 1   General Counseling: Kristine Stephens understanding of the findings of todays visit and agrees with plan of treatment. I have discussed any further diagnostic evaluation that may be needed or ordered today. We also reviewed her medications today. she has been encouraged to call the office with any questions or concerns that should arise related to todays visit.    Orders Placed This Encounter  Procedures   POCT HgB A1C    Meds ordered this encounter  Medications   amphetamine-dextroamphetamine (ADDERALL XR) 10 MG 24 hr capsule    Sig: Take 1 capsule (10 mg total) by mouth daily.    Dispense:  90 capsule    Refill:  0   rOPINIRole (REQUIP) 0.25 MG tablet    Sig: Take 1 tablet (0.25 mg total) by mouth at bedtime.    Dispense:  30 tablet    Refill:  2   rosuvastatin (CRESTOR) 5 MG tablet    Sig: Take 1 tablet (5 mg total) by mouth daily.    Dispense:  90 tablet    Refill:  1    This patient was seen by Drema Dallas, PA-C in collaboration with Dr. Clayborn Bigness as a part of collaborative care agreement.   Total time spent:30 Minutes Time spent includes review of chart, medications, test results, and follow up plan with the patient.      Dr Lavera Guise Internal medicine

## 2022-04-02 DIAGNOSIS — M9905 Segmental and somatic dysfunction of pelvic region: Secondary | ICD-10-CM | POA: Diagnosis not present

## 2022-04-02 DIAGNOSIS — M5416 Radiculopathy, lumbar region: Secondary | ICD-10-CM | POA: Diagnosis not present

## 2022-04-02 DIAGNOSIS — M9903 Segmental and somatic dysfunction of lumbar region: Secondary | ICD-10-CM | POA: Diagnosis not present

## 2022-04-02 DIAGNOSIS — M955 Acquired deformity of pelvis: Secondary | ICD-10-CM | POA: Diagnosis not present

## 2022-04-05 DIAGNOSIS — M5416 Radiculopathy, lumbar region: Secondary | ICD-10-CM | POA: Diagnosis not present

## 2022-04-05 DIAGNOSIS — M9905 Segmental and somatic dysfunction of pelvic region: Secondary | ICD-10-CM | POA: Diagnosis not present

## 2022-04-05 DIAGNOSIS — M9903 Segmental and somatic dysfunction of lumbar region: Secondary | ICD-10-CM | POA: Diagnosis not present

## 2022-04-05 DIAGNOSIS — M955 Acquired deformity of pelvis: Secondary | ICD-10-CM | POA: Diagnosis not present

## 2022-04-12 DIAGNOSIS — M5416 Radiculopathy, lumbar region: Secondary | ICD-10-CM | POA: Diagnosis not present

## 2022-04-12 DIAGNOSIS — M9903 Segmental and somatic dysfunction of lumbar region: Secondary | ICD-10-CM | POA: Diagnosis not present

## 2022-04-12 DIAGNOSIS — M9905 Segmental and somatic dysfunction of pelvic region: Secondary | ICD-10-CM | POA: Diagnosis not present

## 2022-04-12 DIAGNOSIS — M955 Acquired deformity of pelvis: Secondary | ICD-10-CM | POA: Diagnosis not present

## 2022-04-13 DIAGNOSIS — Z23 Encounter for immunization: Secondary | ICD-10-CM | POA: Diagnosis not present

## 2022-04-14 DIAGNOSIS — M5416 Radiculopathy, lumbar region: Secondary | ICD-10-CM | POA: Diagnosis not present

## 2022-04-14 DIAGNOSIS — M9903 Segmental and somatic dysfunction of lumbar region: Secondary | ICD-10-CM | POA: Diagnosis not present

## 2022-04-14 DIAGNOSIS — M9905 Segmental and somatic dysfunction of pelvic region: Secondary | ICD-10-CM | POA: Diagnosis not present

## 2022-04-14 DIAGNOSIS — M955 Acquired deformity of pelvis: Secondary | ICD-10-CM | POA: Diagnosis not present

## 2022-04-16 DIAGNOSIS — M955 Acquired deformity of pelvis: Secondary | ICD-10-CM | POA: Diagnosis not present

## 2022-04-16 DIAGNOSIS — M9903 Segmental and somatic dysfunction of lumbar region: Secondary | ICD-10-CM | POA: Diagnosis not present

## 2022-04-16 DIAGNOSIS — M5416 Radiculopathy, lumbar region: Secondary | ICD-10-CM | POA: Diagnosis not present

## 2022-04-16 DIAGNOSIS — M9905 Segmental and somatic dysfunction of pelvic region: Secondary | ICD-10-CM | POA: Diagnosis not present

## 2022-04-27 DIAGNOSIS — M9903 Segmental and somatic dysfunction of lumbar region: Secondary | ICD-10-CM | POA: Diagnosis not present

## 2022-04-27 DIAGNOSIS — M9905 Segmental and somatic dysfunction of pelvic region: Secondary | ICD-10-CM | POA: Diagnosis not present

## 2022-04-27 DIAGNOSIS — M5416 Radiculopathy, lumbar region: Secondary | ICD-10-CM | POA: Diagnosis not present

## 2022-04-27 DIAGNOSIS — M955 Acquired deformity of pelvis: Secondary | ICD-10-CM | POA: Diagnosis not present

## 2022-04-30 ENCOUNTER — Ambulatory Visit: Payer: BC Managed Care – PPO | Admitting: Physician Assistant

## 2022-05-14 ENCOUNTER — Other Ambulatory Visit: Payer: Self-pay | Admitting: Physician Assistant

## 2022-05-14 DIAGNOSIS — F339 Major depressive disorder, recurrent, unspecified: Secondary | ICD-10-CM

## 2022-07-26 ENCOUNTER — Other Ambulatory Visit: Payer: Self-pay | Admitting: Physician Assistant

## 2022-07-26 ENCOUNTER — Telehealth: Payer: Self-pay | Admitting: Physician Assistant

## 2022-07-26 ENCOUNTER — Ambulatory Visit: Payer: BC Managed Care – PPO | Admitting: Nurse Practitioner

## 2022-07-26 DIAGNOSIS — F988 Other specified behavioral and emotional disorders with onset usually occurring in childhood and adolescence: Secondary | ICD-10-CM

## 2022-07-26 MED ORDER — AMPHETAMINE-DEXTROAMPHET ER 10 MG PO CP24: 10.0000 mg | ORAL_CAPSULE | Freq: Every day | ORAL | 0 refills | Status: DC

## 2022-07-26 NOTE — Telephone Encounter (Signed)
Sent 5 caps for her

## 2022-07-30 ENCOUNTER — Encounter: Payer: Self-pay | Admitting: Nurse Practitioner

## 2022-07-30 ENCOUNTER — Ambulatory Visit (INDEPENDENT_AMBULATORY_CARE_PROVIDER_SITE_OTHER): Payer: BC Managed Care – PPO | Admitting: Nurse Practitioner

## 2022-07-30 VITALS — BP 135/80 | HR 79 | Temp 97.3°F | Resp 16 | Ht 60.0 in | Wt 172.6 lb

## 2022-07-30 DIAGNOSIS — G2581 Restless legs syndrome: Secondary | ICD-10-CM | POA: Diagnosis not present

## 2022-07-30 DIAGNOSIS — E119 Type 2 diabetes mellitus without complications: Secondary | ICD-10-CM | POA: Diagnosis not present

## 2022-07-30 DIAGNOSIS — Z1231 Encounter for screening mammogram for malignant neoplasm of breast: Secondary | ICD-10-CM

## 2022-07-30 DIAGNOSIS — F988 Other specified behavioral and emotional disorders with onset usually occurring in childhood and adolescence: Secondary | ICD-10-CM

## 2022-07-30 DIAGNOSIS — E782 Mixed hyperlipidemia: Secondary | ICD-10-CM

## 2022-07-30 LAB — POCT GLYCOSYLATED HEMOGLOBIN (HGB A1C): Hemoglobin A1C: 6.3 % — AB (ref 4.0–5.6)

## 2022-07-30 MED ORDER — AMPHETAMINE-DEXTROAMPHET ER 10 MG PO CP24: 10.0000 mg | ORAL_CAPSULE | Freq: Every day | ORAL | 0 refills | Status: DC

## 2022-07-30 MED ORDER — ROSUVASTATIN CALCIUM 5 MG PO TABS: 5.0000 mg | ORAL_TABLET | Freq: Every day | ORAL | 1 refills | Status: DC

## 2022-07-30 NOTE — Progress Notes (Signed)
[facility] [place], [place] [address]  Internal MEDICINE  Office Visit Note  Patient Name: Kristine Stephens  [id number]  [id number]  Date of Service: 07/30/2022  Chief Complaint  Patient presents with   Follow-up   Depression   Diabetes   Gastroesophageal Reflux    HPI Kristine Stephens presents for a follow-up visit for  Diabetes -- A1c 6.3 no change, stable Ropinirole did not help  [place] for menopause    Current Medication: Outpatient Encounter Medications as of 07/30/2022  Medication Sig   amoxicillin-clavulanate (AUGMENTIN) 875-125 MG tablet Take 1 tablet by mouth 2 (two) times daily.   b complex vitamins capsule Take 1 capsule by mouth daily.   estradiol (ESTRACE) 0.1 MG/GM vaginal cream INSERT 1 APPLICATORFUL VAGINALLY 3 TIMES A WEEK   etanercept (ENBREL) 50 MG/ML injection Inject 50 mg into the skin once a week.   etodolac (LODINE) 400 MG tablet TK 1 T PO BID   FLUoxetine (PROZAC) 40 MG capsule TAKE 1 CAPSULE(40 MG) BY MOUTH DAILY   magnesium oxide (MAG-OX) 400 (240 Mg) MG tablet Take 400 mg by mouth daily.   omeprazole (PRILOSEC) 20 MG capsule Take 1 capsule (20 mg total) by mouth daily.   [DISCONTINUED] amphetamine-dextroamphetamine (ADDERALL XR) 10 MG 24 hr capsule Take 1 capsule (10 mg total) by mouth daily.   [DISCONTINUED] rOPINIRole (REQUIP) 0.25 MG tablet Take 1 tablet (0.25 mg total) by mouth at bedtime.   [DISCONTINUED] rosuvastatin (CRESTOR) 5 MG tablet Take 1 tablet (5 mg total) by mouth daily.   amphetamine-dextroamphetamine (ADDERALL XR) 10 MG 24 hr capsule Take 1 capsule (10 mg total) by mouth daily.   [START ON 08/27/2022] amphetamine-dextroamphetamine (ADDERALL XR) 10 MG 24 hr capsule Take 1 capsule (10 mg total) by mouth daily.   [START ON 09/24/2022] amphetamine-dextroamphetamine (ADDERALL XR) 10 MG 24 hr capsule Take 1 capsule (10 mg total) by mouth daily.   rosuvastatin (CRESTOR) 5 MG tablet Take 1 tablet (5 mg total) by mouth daily.    No facility-administered encounter medications on file as of 07/30/2022.    Surgical History: Past Surgical History:  Procedure Laterality Date   ASD [place]     COLONOSCOPY WITH PROPOFOL N/A 04/11/2017   Procedure: COLONOSCOPY WITH PROPOFOL;  Surgeon: Jonathon Bellows, MD;  Location: [facility] ENDOSCOPY;  Service: Gastroenterology;  Laterality: N/A;   COLPOSCOPY  2012   ENDOMETRIAL ABLATION     Failed ablation x2 (Tech difficulties)   LAPAROSCOPIC VAGINAL HYSTERECTOMY WITH SALPINGO OOPHORECTOMY  2012   Adenomyosis for AUB   TONSILLECTOMY     TUBAL LIGATION      Medical History: Past Medical History:  Diagnosis Date   Anxiety    Depression    Diabetes mellitus without complication (HCC)    GERD (gastroesophageal reflux disease)    Psoriasis     Family History: Family History  Problem Relation Age of Onset   Breast cancer Paternal Grandmother 78   Multiple myeloma Father    Thyroid disease Paternal Aunt     Social History   Socioeconomic History   Marital status: Married    Spouse name: Not on file   Number of children: Not on file   Years of education: Not on file   Highest education level: Not on file  Occupational History   Not on file  Tobacco Use   Smoking status: Never   Smokeless tobacco: Never  Vaping  Use   Vaping Use: Never used  Substance and Sexual Activity   Alcohol use: No   Drug use: No   Sexual activity: Yes    Partners: Male    Birth control/protection: None  Other Topics Concern   Not on file  Social History Narrative   Not on file   Social Determinants of Health   Financial Resource Strain: Not on file  Food Insecurity: Not on file  Transportation Needs: Not on file  Physical Activity: Not on file  Stress: Not on file  Social Connections: Not on file  Intimate Partner Violence: Not on file      Review of Systems  Vital Signs: BP (!) 143/84   Pulse 79   Temp (!) 97.3 F (36.3  C)   Resp 16   Ht 5' (1.524 m)   Wt 172 lb 9.6 oz (78.3 kg)   SpO2 98%   BMI 33.71 kg/m    Physical Exam     Assessment/Plan:   General Counseling: Kristine Stephens verbalizes understanding of the findings of todays visit and agrees with plan of treatment. I have discussed any further diagnostic evaluation that may be needed or ordered today. We also reviewed her medications today. she has been encouraged to call the office with any questions or concerns that should arise related to todays visit.    Orders Placed This Encounter  Procedures   MM 3D SCREEN BREAST BILATERAL   POCT glycosylated hemoglobin (Hb A1C)    Meds ordered this encounter  Medications   amphetamine-dextroamphetamine (ADDERALL XR) 10 MG 24 hr capsule    Sig: Take 1 capsule (10 mg total) by mouth daily.    Dispense:  30 capsule    Refill:  0    Fill for 07/30/22   amphetamine-dextroamphetamine (ADDERALL XR) 10 MG 24 hr capsule    Sig: Take 1 capsule (10 mg total) by mouth daily.    Dispense:  30 capsule    Refill:  0    Fill for 08/27/22   amphetamine-dextroamphetamine (ADDERALL XR) 10 MG 24 hr capsule    Sig: Take 1 capsule (10 mg total) by mouth daily.    Dispense:  30 capsule    Refill:  0    Fill for 09/24/22   rosuvastatin (CRESTOR) 5 MG tablet    Sig: Take 1 tablet (5 mg total) by mouth daily.    Dispense:  90 tablet    Refill:  1    Return in about 3 months (around 10/23/2022) for F/U, ADHD med check with lauren.   Total time spent:*** Minutes Time spent includes review of chart, medications, test results, and follow up plan with the patient.   [place] Controlled Substance Database was reviewed by me.  This patient was seen by Jonetta Osgood, FNP-C in collaboration with Dr. Clayborn Bigness as a part of collaborative care agreement.   Rodrigo Mcgranahan R. Valetta Fuller, MSN, FNP-C Internal medicine

## 2022-08-01 ENCOUNTER — Encounter: Payer: Self-pay | Admitting: Nurse Practitioner

## 2022-09-03 ENCOUNTER — Ambulatory Visit
Admission: RE | Admit: 2022-09-03 | Discharge: 2022-09-03 | Disposition: A | Discharge status: Home / Self Care | Payer: BC Managed Care – PPO | Source: Ambulatory Visit | Attending: Nurse Practitioner | Admitting: Nurse Practitioner

## 2022-09-03 DIAGNOSIS — Z1231 Encounter for screening mammogram for malignant neoplasm of breast: Secondary | ICD-10-CM | POA: Insufficient documentation

## 2022-10-15 DIAGNOSIS — S6992XA Unspecified injury of left wrist, hand and finger(s), initial encounter: Secondary | ICD-10-CM | POA: Diagnosis not present

## 2022-10-15 DIAGNOSIS — L4050 Arthropathic psoriasis, unspecified: Secondary | ICD-10-CM | POA: Diagnosis not present

## 2022-10-15 DIAGNOSIS — Z79899 Other long term (current) drug therapy: Secondary | ICD-10-CM | POA: Diagnosis not present

## 2022-10-29 ENCOUNTER — Ambulatory Visit (INDEPENDENT_AMBULATORY_CARE_PROVIDER_SITE_OTHER): Payer: BC Managed Care – PPO | Admitting: Physician Assistant

## 2022-10-29 VITALS — BP 127/78 | HR 80 | Temp 98.2°F | Resp 16 | Ht 60.0 in | Wt 178.0 lb

## 2022-10-29 DIAGNOSIS — E119 Type 2 diabetes mellitus without complications: Secondary | ICD-10-CM | POA: Diagnosis not present

## 2022-10-29 DIAGNOSIS — F411 Generalized anxiety disorder: Secondary | ICD-10-CM | POA: Diagnosis not present

## 2022-10-29 DIAGNOSIS — F988 Other specified behavioral and emotional disorders with onset usually occurring in childhood and adolescence: Secondary | ICD-10-CM

## 2022-10-29 DIAGNOSIS — F339 Major depressive disorder, recurrent, unspecified: Secondary | ICD-10-CM | POA: Diagnosis not present

## 2022-10-29 DIAGNOSIS — E782 Mixed hyperlipidemia: Secondary | ICD-10-CM

## 2022-10-29 MED ORDER — LISDEXAMFETAMINE DIMESYLATE 20 MG PO CAPS: 20.0000 mg | ORAL_CAPSULE | Freq: Every day | ORAL | 0 refills | Status: DC

## 2022-10-29 MED ORDER — ROSUVASTATIN CALCIUM 5 MG PO TABS: 5.0000 mg | ORAL_TABLET | Freq: Every day | ORAL | 1 refills | Status: DC

## 2022-10-29 MED ORDER — ESTRADIOL 0.1 MG/GM VA CREA: TOPICAL_CREAM | VAGINAL | 3 refills | Status: DC

## 2022-10-29 MED ORDER — FLUOXETINE HCL 40 MG PO CAPS: ORAL_CAPSULE | ORAL | 1 refills | Status: DC

## 2022-10-29 NOTE — Progress Notes (Signed)
[facility] [address] [place], [place] [address]  Internal MEDICINE  Office Visit Note  Patient Name: Kristine Stephens  [id number]  [id number]  Date of Service: 11/07/2022  Chief Complaint  Patient presents with   Follow-up   Depression   Diabetes   Gastroesophageal Reflux    HPI Pt is here for routine follow up -Adderall started causing nervous ticks and so she stopped this about 3-4 weeks ago. Open to trying alternative -Sore throat for last week, a little congestion. Over the weekend the throat got better, but sneezing now. A little nose bleed. May try antihistamine and will try saline spray as needed for nose bleed   Current Medication: Outpatient Encounter Medications as of 10/29/2022  Medication Sig   b complex vitamins capsule Take 1 capsule by mouth daily.   etanercept (ENBREL) 50 MG/ML injection Inject 50 mg into the skin once a week.   etodolac (LODINE) 400 MG tablet TK 1 T PO BID   lisdexamfetamine (VYVANSE) 20 MG capsule Take 1 capsule (20 mg total) by mouth daily.   magnesium oxide (MAG-OX) 400 (240 Mg) MG tablet Take 400 mg by mouth daily.   omeprazole (PRILOSEC) 20 MG capsule Take 1 capsule (20 mg total) by mouth daily.   [DISCONTINUED] amoxicillin-clavulanate (AUGMENTIN) 875-125 MG tablet Take 1 tablet by mouth 2 (two) times daily.   [DISCONTINUED] amphetamine-dextroamphetamine (ADDERALL XR) 10 MG 24 hr capsule Take 1 capsule (10 mg total) by mouth daily.   [DISCONTINUED] amphetamine-dextroamphetamine (ADDERALL XR) 10 MG 24 hr capsule Take 1 capsule (10 mg total) by mouth daily.   [DISCONTINUED] amphetamine-dextroamphetamine (ADDERALL XR) 10 MG 24 hr capsule Take 1 capsule (10 mg total) by mouth daily.   [DISCONTINUED] estradiol (ESTRACE) 0.1 MG/GM vaginal cream INSERT 1 APPLICATORFUL VAGINALLY 3 TIMES A WEEK   [DISCONTINUED] FLUoxetine (PROZAC) 40 MG capsule TAKE 1 CAPSULE(40 MG) BY MOUTH DAILY   [DISCONTINUED] rosuvastatin (CRESTOR) 5 MG tablet Take  1 tablet (5 mg total) by mouth daily.   estradiol (ESTRACE) 0.1 MG/GM vaginal cream INSERT 1 APPLICATORFUL VAGINALLY 3 TIMES A WEEK   FLUoxetine (PROZAC) 40 MG capsule TAKE 1 CAPSULE(40 MG) BY MOUTH DAILY   rosuvastatin (CRESTOR) 5 MG tablet Take 1 tablet (5 mg total) by mouth daily.   No facility-administered encounter medications on file as of 10/29/2022.    Surgical History: Past Surgical History:  Procedure Laterality Date   ASD [place]  1985   CARPAL TUNNEL RELEASE     CESAREAN SECTION     COLONOSCOPY WITH PROPOFOL N/A 04/11/2017   Procedure: COLONOSCOPY WITH PROPOFOL;  Surgeon: Wyline Mood, MD;  Location: [facility] ENDOSCOPY;  Service: Gastroenterology;  Laterality: N/A;   COLPOSCOPY  2012   ENDOMETRIAL ABLATION     Failed ablation x2 (Tech difficulties)   LAPAROSCOPIC VAGINAL HYSTERECTOMY WITH SALPINGO OOPHORECTOMY  2012   Adenomyosis for AUB   TONSILLECTOMY     TUBAL LIGATION      Medical History: Past Medical History:  Diagnosis Date   Anxiety    Depression    Diabetes mellitus without complication (HCC)    GERD (gastroesophageal reflux disease)    Psoriasis     Family History: Family History  Problem Relation Age of Onset   Breast cancer Paternal Grandmother 69   Multiple myeloma Father    Thyroid disease Paternal Aunt     Social History   Socioeconomic History   Marital status: Married    Spouse name: Not on file   Number  of children: Not on file   Years of education: Not on file   Highest education level: Not on file  Occupational History   Not on file  Tobacco Use   Smoking status: Never   Smokeless tobacco: Never  Vaping Use   Vaping Use: Never used  Substance and Sexual Activity   Alcohol use: No   Drug use: No   Sexual activity: Yes    Partners: Male    Birth control/protection: None  Other Topics Concern   Not on file  Social History Narrative   Not on file   Social Determinants of Health   Financial Resource Strain: Not on file  Food  Insecurity: Not on file  Transportation Needs: Not on file  Physical Activity: Not on file  Stress: Not on file  Social Connections: Not on file  Intimate Partner Violence: Not on file      Review of Systems  Constitutional:  Negative for chills, fatigue and unexpected weight change.  HENT:  Positive for congestion, rhinorrhea, sneezing and sore throat.   Respiratory: Negative.  Negative for cough, chest tightness and shortness of breath.   Cardiovascular: Negative.  Negative for chest pain and palpitations.  Gastrointestinal:  Negative for abdominal pain, constipation, diarrhea, nausea and vomiting.  Musculoskeletal:  Negative for arthralgias, back pain, joint swelling and neck pain.  Skin:  Negative for rash.  Allergic/Immunologic: Positive for environmental allergies.  Neurological: Negative.  Negative for tremors and numbness.  Psychiatric/Behavioral:  Positive for decreased concentration. Negative for behavioral problems (Depression), sleep disturbance and suicidal ideas. The patient is not nervous/anxious.     Vital Signs: BP 127/78   Pulse 80   Temp 98.2 F (36.8 C)   Resp 16   Ht 5' (1.524 m)   Wt 178 lb (80.7 kg)   SpO2 99%   BMI 34.76 kg/m    Physical Exam Vitals and nursing note reviewed.  Constitutional:      General: She is not in acute distress.    Appearance: She is well-developed. She is obese. She is not diaphoretic.  HENT:     Head: Normocephalic and atraumatic.     Mouth/Throat:     Pharynx: No oropharyngeal exudate.  Eyes:     Pupils: Pupils are equal, round, and reactive to light.  Neck:     Thyroid: No thyromegaly.     Vascular: No JVD.     Trachea: No tracheal deviation.  Cardiovascular:     Rate and Rhythm: Normal rate and regular rhythm.     Heart sounds: Normal heart sounds. No murmur heard.    No friction rub. No gallop.  Pulmonary:     Effort: Pulmonary effort is normal. No respiratory distress.     Breath sounds: No wheezing or  rales.  Chest:     Chest wall: No tenderness.  Abdominal:     General: Bowel sounds are normal.     Palpations: Abdomen is soft.  Musculoskeletal:        General: Normal range of motion.     Cervical back: Normal range of motion and neck supple.  Lymphadenopathy:     Cervical: No cervical adenopathy.  Skin:    General: Skin is warm and dry.  Neurological:     Mental Status: She is alert and oriented to person, place, and time.     Cranial Nerves: No cranial nerve deficit.  Psychiatric:        Behavior: Behavior normal.  Thought Content: Thought content normal.        Judgment: Judgment normal.        Assessment/Plan: 1. Attention deficit disorder (ADD) in adult D/c adderal due to S/E and will try switch to vyvanse - lisdexamfetamine (VYVANSE) 20 MG capsule; Take 1 capsule (20 mg total) by mouth daily.  Dispense: 30 capsule; Refill: 0  2. GAD (generalized anxiety disorder) Continue prozac as before  3. Episode of recurrent major depressive disorder, unspecified depression episode severity (HCC) - FLUoxetine (PROZAC) 40 MG capsule; TAKE 1 CAPSULE(40 MG) BY MOUTH DAILY  Dispense: 90 capsule; Refill: 1  4. Type 2 diabetes mellitus without complication, without long-term current use of insulin (HCC) Continue to work on diet and exercise - Urine Microalbumin w/creat. ratio  5. Mixed hyperlipidemia - rosuvastatin (CRESTOR) 5 MG tablet; Take 1 tablet (5 mg total) by mouth daily.  Dispense: 90 tablet; Refill: 1   General Counseling: rosaland shiffman understanding of the findings of todays visit and agrees with plan of treatment. I have discussed any further diagnostic evaluation that may be needed or ordered today. We also reviewed her medications today. she has been encouraged to call the office with any questions or concerns that should arise related to todays visit.    Orders Placed This Encounter  Procedures   Urine Microalbumin w/creat. ratio    Meds ordered  this encounter  Medications   lisdexamfetamine (VYVANSE) 20 MG capsule    Sig: Take 1 capsule (20 mg total) by mouth daily.    Dispense:  30 capsule    Refill:  0   FLUoxetine (PROZAC) 40 MG capsule    Sig: TAKE 1 CAPSULE(40 MG) BY MOUTH DAILY    Dispense:  90 capsule    Refill:  1   rosuvastatin (CRESTOR) 5 MG tablet    Sig: Take 1 tablet (5 mg total) by mouth daily.    Dispense:  90 tablet    Refill:  1   estradiol (ESTRACE) 0.1 MG/GM vaginal cream    Sig: INSERT 1 APPLICATORFUL VAGINALLY 3 TIMES A WEEK    Dispense:  42.5 g    Refill:  3    This patient was seen by Lynn Ito, PA-C in collaboration with Dr. Beverely Risen as a part of collaborative care agreement.   Total time spent:30 Minutes Time spent includes review of chart, medications, test results, and follow up plan with the patient.      Dr Lyndon Code Internal medicine

## 2022-11-22 ENCOUNTER — Encounter: Payer: Self-pay | Admitting: Nurse Practitioner

## 2022-11-22 ENCOUNTER — Ambulatory Visit (INDEPENDENT_AMBULATORY_CARE_PROVIDER_SITE_OTHER): Payer: BC Managed Care – PPO | Admitting: Nurse Practitioner

## 2022-11-22 VITALS — BP 130/84 | HR 93 | Temp 98.0°F | Resp 16 | Ht 60.0 in | Wt 170.0 lb

## 2022-11-22 DIAGNOSIS — J028 Acute pharyngitis due to other specified organisms: Secondary | ICD-10-CM | POA: Diagnosis not present

## 2022-11-22 DIAGNOSIS — J029 Acute pharyngitis, unspecified: Secondary | ICD-10-CM | POA: Diagnosis not present

## 2022-11-22 DIAGNOSIS — K219 Gastro-esophageal reflux disease without esophagitis: Secondary | ICD-10-CM

## 2022-11-22 DIAGNOSIS — F988 Other specified behavioral and emotional disorders with onset usually occurring in childhood and adolescence: Secondary | ICD-10-CM | POA: Diagnosis not present

## 2022-11-22 DIAGNOSIS — B9689 Other specified bacterial agents as the cause of diseases classified elsewhere: Secondary | ICD-10-CM

## 2022-11-22 LAB — POCT RAPID STREP A (OFFICE): Rapid Strep A Screen: NEGATIVE

## 2022-11-22 MED ORDER — LISDEXAMFETAMINE DIMESYLATE 30 MG PO CAPS
30.0000 mg | ORAL_CAPSULE | Freq: Every day | ORAL | 0 refills | Status: DC
Start: 2022-11-22 — End: 2023-01-03

## 2022-11-22 MED ORDER — AMOXICILLIN-POT CLAVULANATE 875-125 MG PO TABS
1.0000 | ORAL_TABLET | Freq: Two times a day (BID) | ORAL | 0 refills | Status: AC
Start: 2022-11-22 — End: 2022-12-02

## 2022-11-22 MED ORDER — OMEPRAZOLE 40 MG PO CPDR
40.0000 mg | DELAYED_RELEASE_CAPSULE | Freq: Every day | ORAL | 3 refills | Status: AC
Start: 2022-11-22 — End: ?

## 2022-11-22 NOTE — Progress Notes (Signed)
[facility] [address] [place], [place] [address]  Internal MEDICINE  Office Visit Note  Patient Name: Kristine Stephens  [id number]  [id number]  Date of Service: 11/22/2022  Chief Complaint  Patient presents with   Acute Visit    Sore throat, congestion. headache     HPI Kristine Stephens presents for an acute sick visit for sore throat Reports nasal congestion, sore throat, trouble swallowing, hurts to eat, sinus pressure  Negative for strep throat  --GERD -- current dose of omeprazole is not helping, heartburn has been pretty bad.  --ADHD -- vyvanse has been helping but wears off in the afternoon, wants to increase dose. --Leflunomide is causing diarrhea -- she plans to call rheumatology and ask to switch to something else.     Current Medication:  Outpatient Encounter Medications as of 11/22/2022  Medication Sig   amoxicillin-clavulanate (AUGMENTIN) 875-125 MG tablet Take 1 tablet by mouth 2 (two) times daily for 10 days. Take with food   b complex vitamins capsule Take 1 capsule by mouth daily.   estradiol (ESTRACE) 0.1 MG/GM vaginal cream INSERT 1 APPLICATORFUL VAGINALLY 3 TIMES A WEEK   etanercept (ENBREL) 50 MG/ML injection Inject 50 mg into the skin once a week.   etodolac (LODINE) 400 MG tablet TK 1 T PO BID   FLUoxetine (PROZAC) 40 MG capsule TAKE 1 CAPSULE(40 MG) BY MOUTH DAILY   lisdexamfetamine (VYVANSE) 30 MG capsule Take 1 capsule (30 mg total) by mouth daily.   magnesium oxide (MAG-OX) 400 (240 Mg) MG tablet Take 400 mg by mouth daily.   omeprazole (PRILOSEC) 40 MG capsule Take 1 capsule (40 mg total) by mouth daily.   rosuvastatin (CRESTOR) 5 MG tablet Take 1 tablet (5 mg total) by mouth daily.   [DISCONTINUED] lisdexamfetamine (VYVANSE) 20 MG capsule Take 1 capsule (20 mg total) by mouth daily.   [DISCONTINUED] omeprazole (PRILOSEC) 20 MG capsule Take 1 capsule (20 mg total) by mouth daily.   No facility-administered encounter medications on file as of  11/22/2022.      Medical History: Past Medical History:  Diagnosis Date   Anxiety    Depression    Diabetes mellitus without complication (HCC)    GERD (gastroesophageal reflux disease)    Psoriasis      Vital Signs: BP 130/84   Pulse 93   Temp 98 F (36.7 C)   Resp 16   Ht 5' (1.524 m)   Wt 170 lb (77.1 kg)   SpO2 97%   BMI 33.20 kg/m    Review of Systems  Constitutional:  Positive for fatigue. Negative for chills and fever.  HENT:  Positive for congestion, postnasal drip, sinus pressure, sore throat and trouble swallowing.   Respiratory:  Positive for cough. Negative for chest tightness, shortness of breath and wheezing.   Cardiovascular:  Negative for chest pain and palpitations.  Neurological:  Positive for headaches.    Physical Exam Vitals reviewed.  Constitutional:      General: She is not in acute distress.    Appearance: Normal appearance. She is obese. She is ill-appearing.  HENT:     Head: Normocephalic and atraumatic.     Right Ear: Tympanic membrane, ear canal and external ear normal.     Left Ear: Tympanic membrane, ear canal and external ear normal.     Nose: Mucosal edema, congestion and rhinorrhea present.     Right Turbinates: Swollen and pale.     Left Turbinates: Swollen and pale.  Right Sinus: Maxillary sinus tenderness present.     Left Sinus: Maxillary sinus tenderness present.     Mouth/Throat:     Lips: Pink.     Mouth: Mucous membranes are moist.     Pharynx: Posterior oropharyngeal erythema present.  Eyes:     Pupils: Pupils are equal, round, and reactive to light.  Cardiovascular:     Rate and Rhythm: Normal rate and regular rhythm.     Heart sounds: Normal heart sounds. No murmur heard. Pulmonary:     Effort: Pulmonary effort is normal. No respiratory distress.     Breath sounds: Normal breath sounds. No wheezing.  Neurological:     Mental Status: She is alert and oriented to person, place, and time.  Psychiatric:         Mood and Affect: Mood normal.        Behavior: Behavior normal.       Assessment/Plan: 1. Acute bacterial pharyngitis Empiric treatment with augmentin, take until gone as prescribed.  - amoxicillin-clavulanate (AUGMENTIN) 875-125 MG tablet; Take 1 tablet by mouth 2 (two) times daily for 10 days. Take with food  Dispense: 20 tablet; Refill: 0  2. Sore throat Negative for strep - POCT rapid strep A  3. Gastroesophageal reflux disease without esophagitis Increase dose to 40 mg daily, take as prescribed.  - omeprazole (PRILOSEC) 40 MG capsule; Take 1 capsule (40 mg total) by mouth daily.  Dispense: 30 capsule; Refill: 3  4. Attention deficit disorder (ADD) in adult Increased vyvanse dose to 30 mg daily. Follow up in 4 weeks  - lisdexamfetamine (VYVANSE) 30 MG capsule; Take 1 capsule (30 mg total) by mouth daily.  Dispense: 30 capsule; Refill: 0   General Counseling: Kristine Stephens understanding of the findings of todays visit and agrees with plan of treatment. I have discussed any further diagnostic evaluation that may be needed or ordered today. We also reviewed her medications today. she has been encouraged to call the office with any questions or concerns that should arise related to todays visit.    Counseling:    Orders Placed This Encounter  Procedures   POCT rapid strep A    Meds ordered this encounter  Medications   omeprazole (PRILOSEC) 40 MG capsule    Sig: Take 1 capsule (40 mg total) by mouth daily.    Dispense:  30 capsule    Refill:  3    Note increased dose, please fill through insurance if covered.   lisdexamfetamine (VYVANSE) 30 MG capsule    Sig: Take 1 capsule (30 mg total) by mouth daily.    Dispense:  30 capsule    Refill:  0    Note increased dose, please fill new script and discontinue 20 mg dose.   amoxicillin-clavulanate (AUGMENTIN) 875-125 MG tablet    Sig: Take 1 tablet by mouth 2 (two) times daily for 10 days. Take with food    Dispense:   20 tablet    Refill:  0    Return if symptoms worsen or fail to improve, for make sure regular follow up is scheduled .  [place] Controlled Substance Database was reviewed by me for overdose risk score (ORS)  Time spent:30 Minutes Time spent with patient included reviewing progress notes, labs, imaging studies, and discussing plan for follow up.   This patient was seen by Sallyanne Kuster, FNP-C in collaboration with Dr. Beverely Risen as a part of collaborative care agreement.  Chrisa Hassan R. Tedd Sias, MSN, FNP-C Internal Medicine

## 2022-11-26 ENCOUNTER — Ambulatory Visit: Payer: BC Managed Care – PPO | Admitting: Physician Assistant

## 2022-11-29 DIAGNOSIS — Z79899 Other long term (current) drug therapy: Secondary | ICD-10-CM | POA: Diagnosis not present

## 2022-11-29 DIAGNOSIS — L4050 Arthropathic psoriasis, unspecified: Secondary | ICD-10-CM | POA: Diagnosis not present

## 2023-01-03 ENCOUNTER — Ambulatory Visit (INDEPENDENT_AMBULATORY_CARE_PROVIDER_SITE_OTHER): Payer: BC Managed Care – PPO | Admitting: Physician Assistant

## 2023-01-03 ENCOUNTER — Encounter: Payer: Self-pay | Admitting: Physician Assistant

## 2023-01-03 VITALS — BP 115/80 | HR 85 | Temp 98.2°F | Resp 16 | Ht 60.0 in | Wt 173.4 lb

## 2023-01-03 DIAGNOSIS — E119 Type 2 diabetes mellitus without complications: Secondary | ICD-10-CM | POA: Diagnosis not present

## 2023-01-03 DIAGNOSIS — E782 Mixed hyperlipidemia: Secondary | ICD-10-CM

## 2023-01-03 DIAGNOSIS — Z0001 Encounter for general adult medical examination with abnormal findings: Secondary | ICD-10-CM

## 2023-01-03 DIAGNOSIS — R300 Dysuria: Secondary | ICD-10-CM | POA: Diagnosis not present

## 2023-01-03 DIAGNOSIS — R5383 Other fatigue: Secondary | ICD-10-CM

## 2023-01-03 DIAGNOSIS — R7989 Other specified abnormal findings of blood chemistry: Secondary | ICD-10-CM

## 2023-01-03 DIAGNOSIS — F988 Other specified behavioral and emotional disorders with onset usually occurring in childhood and adolescence: Secondary | ICD-10-CM | POA: Diagnosis not present

## 2023-01-03 DIAGNOSIS — E559 Vitamin D deficiency, unspecified: Secondary | ICD-10-CM

## 2023-01-03 MED ORDER — LISDEXAMFETAMINE DIMESYLATE 30 MG PO CAPS
30.0000 mg | ORAL_CAPSULE | Freq: Every day | ORAL | 0 refills | Status: DC
Start: 2023-01-03 — End: 2023-02-05

## 2023-01-03 NOTE — Progress Notes (Signed)
[facility] [address] [place], [place] [address]  Internal MEDICINE  Office Visit Note  Patient Name: Kristine Stephens  [id number]  [id number]  Date of Service: 01/16/2023  Chief Complaint  Patient presents with   Annual Exam   Depression   Diabetes   Gastroesophageal Reflux     HPI Pt is here for routine health maintenance examination -will look into shingles vaccines -Doing well with vyvanse, some sleep difficulty some days when she has caffeine in afternoon, but otherwise no problems with this -a little forgetful at times with people's names -continues to follow with rheumatology and meds being adjusted -BP stable -labs due -mammogram done  Current Medication: Outpatient Encounter Medications as of 01/03/2023  Medication Sig   b complex vitamins capsule Take 1 capsule by mouth daily.   estradiol (ESTRACE) 0.1 MG/GM vaginal cream INSERT 1 APPLICATORFUL VAGINALLY 3 TIMES A WEEK   etanercept (ENBREL) 50 MG/ML injection Inject 50 mg into the skin once a week.   etodolac (LODINE) 400 MG tablet TK 1 T PO BID   FLUoxetine (PROZAC) 40 MG capsule TAKE 1 CAPSULE(40 MG) BY MOUTH DAILY   leflunomide (ARAVA) 10 MG tablet Take 10 mg by mouth daily.   magnesium oxide (MAG-OX) 400 (240 Mg) MG tablet Take 400 mg by mouth daily.   omeprazole (PRILOSEC) 40 MG capsule Take 1 capsule (40 mg total) by mouth daily.   rosuvastatin (CRESTOR) 5 MG tablet Take 1 tablet (5 mg total) by mouth daily.   [DISCONTINUED] lisdexamfetamine (VYVANSE) 30 MG capsule Take 1 capsule (30 mg total) by mouth daily.   lisdexamfetamine (VYVANSE) 30 MG capsule Take 1 capsule (30 mg total) by mouth daily.   No facility-administered encounter medications on file as of 01/03/2023.    Surgical History: Past Surgical History:  Procedure Laterality Date   ASD REPAIR  1985   CARPAL TUNNEL RELEASE     CESAREAN SECTION     COLONOSCOPY WITH PROPOFOL N/A 04/11/2017   Procedure: COLONOSCOPY WITH PROPOFOL;   Surgeon: Wyline Mood, MD;  Location: [facility] ENDOSCOPY;  Service: Gastroenterology;  Laterality: N/A;   COLPOSCOPY  2012   ENDOMETRIAL ABLATION     Failed ablation x2 (Tech difficulties)   LAPAROSCOPIC VAGINAL HYSTERECTOMY WITH SALPINGO OOPHORECTOMY  2012   Adenomyosis for AUB   TONSILLECTOMY     TUBAL LIGATION      Medical History: Past Medical History:  Diagnosis Date   Anxiety    Depression    Diabetes mellitus without complication (HCC)    GERD (gastroesophageal reflux disease)    Psoriasis     Family History: Family History  Problem Relation Age of Onset   Breast cancer Paternal Grandmother 76   Multiple myeloma Father    Thyroid disease Paternal Aunt       Review of Systems  Constitutional:  Negative for chills, fatigue and unexpected weight change.  HENT:  Negative for congestion, rhinorrhea, sneezing and sore throat.   Respiratory: Negative.  Negative for cough, chest tightness and shortness of breath.   Cardiovascular: Negative.  Negative for chest pain and palpitations.  Gastrointestinal:  Negative for abdominal pain, constipation, diarrhea, nausea and vomiting.  Musculoskeletal:  Positive for arthralgias. Negative for back pain, joint swelling and neck pain.  Skin:  Negative for rash.  Neurological: Negative.  Negative for tremors and numbness.  Psychiatric/Behavioral:  Positive for decreased concentration. Negative for behavioral problems (Depression), sleep disturbance and suicidal ideas. The patient is not nervous/anxious.  Vital Signs: BP 115/80   Pulse 85   Temp 98.2 F (36.8 C)   Resp 16   Ht 5' (1.524 m)   Wt 173 lb 6.4 oz (78.7 kg)   SpO2 98%   BMI 33.86 kg/m    Physical Exam Vitals and nursing note reviewed.  Constitutional:      General: She is not in acute distress.    Appearance: She is well-developed. She is obese. She is not diaphoretic.  HENT:     Head: Normocephalic and atraumatic.     Mouth/Throat:     Pharynx: No  oropharyngeal exudate.  Eyes:     Pupils: Pupils are equal, round, and reactive to light.  Neck:     Thyroid: No thyromegaly.     Vascular: No JVD.     Trachea: No tracheal deviation.  Cardiovascular:     Rate and Rhythm: Normal rate and regular rhythm.     Heart sounds: Normal heart sounds. No murmur heard.    No friction rub. No gallop.  Pulmonary:     Effort: Pulmonary effort is normal. No respiratory distress.     Breath sounds: No wheezing or rales.  Chest:     Chest wall: No tenderness.  Abdominal:     General: Bowel sounds are normal.     Palpations: Abdomen is soft.     Tenderness: There is no abdominal tenderness.  Musculoskeletal:        General: Normal range of motion.     Cervical back: Normal range of motion and neck supple.  Lymphadenopathy:     Cervical: No cervical adenopathy.  Skin:    General: Skin is warm and dry.  Neurological:     Mental Status: She is alert and oriented to person, place, and time.     Cranial Nerves: No cranial nerve deficit.  Psychiatric:        Behavior: Behavior normal.        Thought Content: Thought content normal.        Judgment: Judgment normal.      LABS: Recent Results (from the past 2160 hour(s))  POCT rapid strep A     Status: None   Collection Time: 11/22/22  3:03 PM  Result Value Ref Range   Rapid Strep A Screen Negative Negative  UA/M w/rflx Culture, Routine     Status: Abnormal   Collection Time: 01/03/23  3:53 PM   Specimen: Urine   Urine  Result Value Ref Range   Specific Gravity, UA 1.017 1.005 - 1.030   pH, UA 5.5 5.0 - 7.5   Color, UA Yellow Yellow   Appearance Ur Clear Clear   Leukocytes,UA Negative Negative   Protein,UA Negative Negative/Trace   Glucose, UA Trace (A) Negative   Ketones, UA Negative Negative   RBC, UA Negative Negative   Bilirubin, UA Negative Negative   Urobilinogen, Ur 0.2 0.2 - 1.0 mg/dL   Nitrite, UA Negative Negative   Microscopic Examination Comment     Comment:  Microscopic follows if indicated.   Microscopic Examination See below:     Comment: Microscopic was indicated and was performed.   Urinalysis Reflex Comment     Comment: This specimen will not reflex to a Urine Culture.  Microscopic Examination     Status: Abnormal   Collection Time: 01/03/23  3:53 PM   Urine  Result Value Ref Range   WBC, UA None seen 0 - 5 /hpf   RBC, Urine 3-10 (A) 0 -  2 /hpf   Epithelial Cells (non renal) 0-10 0 - 10 /hpf   Casts None seen None seen /lpf   Bacteria, UA None seen None seen/Few  UA/M w/rflx Culture, Routine     Status: None   Collection Time: 01/11/23  3:47 PM   Specimen: Urine  Result Value Ref Range   Specific Gravity, UA 1.018 1.005 - 1.030   pH, UA 6.5 5.0 - 7.5   Color, UA Yellow Yellow   Appearance Ur Clear Clear   Leukocytes,UA Negative Negative   Protein,UA Negative Negative/Trace   Glucose, UA Negative Negative   Ketones, UA Negative Negative   RBC, UA Negative Negative   Bilirubin, UA Negative Negative   Urobilinogen, Ur 0.2 0.2 - 1.0 mg/dL   Nitrite, UA Negative Negative   Microscopic Examination Comment     Comment: Microscopic follows if indicated.   Microscopic Examination See below:     Comment: Microscopic was indicated and was performed.   Urinalysis Reflex Comment     Comment: This specimen will not reflex to a Urine Culture.  Microscopic Examination     Status: None   Collection Time: 01/11/23  3:47 PM  Result Value Ref Range   WBC, UA None seen 0 - 5 /hpf   RBC, Urine 0-2 0 - 2 /hpf   Epithelial Cells (non renal) 0-10 0 - 10 /hpf   Casts None seen None seen /lpf   Bacteria, UA None seen None seen/Few        Assessment/Plan: 1. Encounter for general adult medical examination with abnormal findings CPE performed, labs ordered, mammogram UTD, will schedule shingles vaccines at pharmacy  2. Type 2 diabetes mellitus without complication, without long-term current use of insulin (HCC) - Hgb A1C w/o eAG  3.  Attention deficit disorder (ADD) in adult - lisdexamfetamine (VYVANSE) 30 MG capsule; Take 1 capsule (30 mg total) by mouth daily.  Dispense: 30 capsule; Refill: 0  4. Mixed hyperlipidemia Continue crestor and will update labs - Lipid Panel With LDL/HDL Ratio  5. Abnormal thyroid blood test - TSH + free T4  6. Vitamin D deficiency - VITAMIN D 25 Hydroxy (Vit-D Deficiency, Fractures)  7. Other fatigue - Comprehensive metabolic panel - Lipid Panel With LDL/HDL Ratio - TSH + free T4 - Hgb A1C w/o eAG - VITAMIN D 25 Hydroxy (Vit-D Deficiency, Fractures)  8. Dysuria - UA/M w/rflx Culture, Routine - UA/M w/rflx Culture, Routine   General Counseling: brennan ostrem understanding of the findings of todays visit and agrees with plan of treatment. I have discussed any further diagnostic evaluation that may be needed or ordered today. We also reviewed her medications today. she has been encouraged to call the office with any questions or concerns that should arise related to todays visit.    Counseling:    Orders Placed This Encounter  Procedures   Microscopic Examination   Microscopic Examination   UA/M w/rflx Culture, Routine   Comprehensive metabolic panel   Lipid Panel With LDL/HDL Ratio   TSH + free T4   Hgb A1C w/o eAG   VITAMIN D 25 Hydroxy (Vit-D Deficiency, Fractures)   UA/M w/rflx Culture, Routine    Meds ordered this encounter  Medications   lisdexamfetamine (VYVANSE) 30 MG capsule    Sig: Take 1 capsule (30 mg total) by mouth daily.    Dispense:  30 capsule    Refill:  0    Note increased dose, please fill new script and discontinue 20 mg dose.  This patient was seen by Lynn Ito, PA-C in collaboration with Dr. Beverely Risen as a part of collaborative care agreement.  Total time spent:35 Minutes  Time spent includes review of chart, medications, test results, and follow up plan with the patient.     Lyndon Code, MD  Internal Medicine

## 2023-01-04 LAB — UA/M W/RFLX CULTURE, ROUTINE
Bilirubin, UA: NEGATIVE
Ketones, UA: NEGATIVE
Leukocytes,UA: NEGATIVE
Nitrite, UA: NEGATIVE
Protein,UA: NEGATIVE
RBC, UA: NEGATIVE
Specific Gravity, UA: 1.017 (ref 1.005–1.030)
Urobilinogen, Ur: 0.2 mg/dL (ref 0.2–1.0)
pH, UA: 5.5 (ref 5.0–7.5)

## 2023-01-04 LAB — MICROSCOPIC EXAMINATION
Bacteria, UA: NONE SEEN
Casts: NONE SEEN /LPF
WBC, UA: NONE SEEN /HPF (ref 0–5)

## 2023-01-11 ENCOUNTER — Telehealth: Payer: Self-pay

## 2023-01-11 DIAGNOSIS — R300 Dysuria: Secondary | ICD-10-CM | POA: Diagnosis not present

## 2023-01-11 NOTE — Telephone Encounter (Signed)
-----   Message from Carlean Jews, PA-C sent at 01/09/2023  3:01 PM EDT ----- Please let pt know she has some RBC in her urine which if she was not having cycle would like to recheck

## 2023-01-11 NOTE — Telephone Encounter (Signed)
Spoke with patient regarding RBC in urine, patient will repeat urine sample through [facility]. Order placed.

## 2023-01-12 LAB — UA/M W/RFLX CULTURE, ROUTINE
Bilirubin, UA: NEGATIVE
Glucose, UA: NEGATIVE
Ketones, UA: NEGATIVE
Leukocytes,UA: NEGATIVE
Nitrite, UA: NEGATIVE
Protein,UA: NEGATIVE
RBC, UA: NEGATIVE
Specific Gravity, UA: 1.018 (ref 1.005–1.030)
Urobilinogen, Ur: 0.2 mg/dL (ref 0.2–1.0)
pH, UA: 6.5 (ref 5.0–7.5)

## 2023-01-12 LAB — MICROSCOPIC EXAMINATION
Bacteria, UA: NONE SEEN
Casts: NONE SEEN /lpf
WBC, UA: NONE SEEN /hpf (ref 0–5)

## 2023-01-15 DIAGNOSIS — L4050 Arthropathic psoriasis, unspecified: Secondary | ICD-10-CM | POA: Diagnosis not present

## 2023-01-15 DIAGNOSIS — M5440 Lumbago with sciatica, unspecified side: Secondary | ICD-10-CM | POA: Diagnosis not present

## 2023-01-15 DIAGNOSIS — Z79899 Other long term (current) drug therapy: Secondary | ICD-10-CM | POA: Diagnosis not present

## 2023-01-15 DIAGNOSIS — G8929 Other chronic pain: Secondary | ICD-10-CM | POA: Diagnosis not present

## 2023-02-05 ENCOUNTER — Other Ambulatory Visit: Payer: Self-pay

## 2023-02-05 DIAGNOSIS — F988 Other specified behavioral and emotional disorders with onset usually occurring in childhood and adolescence: Secondary | ICD-10-CM

## 2023-02-05 MED ORDER — LISDEXAMFETAMINE DIMESYLATE 30 MG PO CAPS
30.0000 mg | ORAL_CAPSULE | Freq: Every day | ORAL | 0 refills | Status: DC
Start: 2023-02-05 — End: 2023-03-12

## 2023-03-12 ENCOUNTER — Telehealth: Payer: Self-pay

## 2023-03-12 ENCOUNTER — Other Ambulatory Visit: Payer: Self-pay | Admitting: Physician Assistant

## 2023-03-12 DIAGNOSIS — F988 Other specified behavioral and emotional disorders with onset usually occurring in childhood and adolescence: Secondary | ICD-10-CM

## 2023-03-12 MED ORDER — LISDEXAMFETAMINE DIMESYLATE 30 MG PO CAPS
30.0000 mg | ORAL_CAPSULE | Freq: Every day | ORAL | 0 refills | Status: DC
Start: 2023-03-12 — End: 2023-03-29

## 2023-03-12 NOTE — Telephone Encounter (Signed)
Done

## 2023-03-28 ENCOUNTER — Ambulatory Visit: Payer: BC Managed Care – PPO | Admitting: Physician Assistant

## 2023-03-29 ENCOUNTER — Encounter: Payer: Self-pay | Admitting: Physician Assistant

## 2023-03-29 ENCOUNTER — Ambulatory Visit: Payer: BC Managed Care – PPO | Admitting: Physician Assistant

## 2023-03-29 VITALS — BP 140/90 | HR 93 | Temp 98.3°F | Resp 16 | Ht 60.0 in | Wt 170.4 lb

## 2023-03-29 DIAGNOSIS — F411 Generalized anxiety disorder: Secondary | ICD-10-CM

## 2023-03-29 DIAGNOSIS — F988 Other specified behavioral and emotional disorders with onset usually occurring in childhood and adolescence: Secondary | ICD-10-CM | POA: Diagnosis not present

## 2023-03-29 DIAGNOSIS — E119 Type 2 diabetes mellitus without complications: Secondary | ICD-10-CM

## 2023-03-29 LAB — POCT GLYCOSYLATED HEMOGLOBIN (HGB A1C): Hemoglobin A1C: 6.2 % — AB (ref 4.0–5.6)

## 2023-03-29 MED ORDER — HYDROXYZINE PAMOATE 25 MG PO CAPS: 25.0000 mg | ORAL_CAPSULE | Freq: Three times a day (TID) | ORAL | 2 refills | Status: AC | PRN

## 2023-03-29 MED ORDER — LISDEXAMFETAMINE DIMESYLATE 30 MG PO CAPS
30.0000 mg | ORAL_CAPSULE | Freq: Every day | ORAL | 0 refills | Status: DC
Start: 2023-04-09 — End: 2023-05-15

## 2023-04-01 ENCOUNTER — Ambulatory Visit: Payer: BC Managed Care – PPO | Admitting: Physician Assistant

## 2023-04-25 DIAGNOSIS — L4050 Arthropathic psoriasis, unspecified: Secondary | ICD-10-CM | POA: Diagnosis not present

## 2023-04-25 DIAGNOSIS — Z79899 Other long term (current) drug therapy: Secondary | ICD-10-CM | POA: Diagnosis not present

## 2023-05-14 ENCOUNTER — Other Ambulatory Visit: Payer: Self-pay | Admitting: Physician Assistant

## 2023-05-14 DIAGNOSIS — F339 Major depressive disorder, recurrent, unspecified: Secondary | ICD-10-CM

## 2023-05-15 ENCOUNTER — Telehealth: Payer: Self-pay

## 2023-05-15 ENCOUNTER — Other Ambulatory Visit: Payer: Self-pay | Admitting: Physician Assistant

## 2023-05-15 DIAGNOSIS — F988 Other specified behavioral and emotional disorders with onset usually occurring in childhood and adolescence: Secondary | ICD-10-CM

## 2023-05-15 MED ORDER — LISDEXAMFETAMINE DIMESYLATE 30 MG PO CAPS: 30.0000 mg | ORAL_CAPSULE | Freq: Every day | ORAL | 0 refills | Status: DC

## 2023-05-15 NOTE — Telephone Encounter (Signed)
Lmom we sent med 

## 2023-06-12 DIAGNOSIS — R7989 Other specified abnormal findings of blood chemistry: Secondary | ICD-10-CM | POA: Diagnosis not present

## 2023-06-12 DIAGNOSIS — E119 Type 2 diabetes mellitus without complications: Secondary | ICD-10-CM | POA: Diagnosis not present

## 2023-06-12 DIAGNOSIS — E559 Vitamin D deficiency, unspecified: Secondary | ICD-10-CM | POA: Diagnosis not present

## 2023-06-12 DIAGNOSIS — E782 Mixed hyperlipidemia: Secondary | ICD-10-CM | POA: Diagnosis not present

## 2023-06-12 DIAGNOSIS — R5383 Other fatigue: Secondary | ICD-10-CM | POA: Diagnosis not present

## 2023-06-13 ENCOUNTER — Other Ambulatory Visit: Payer: Self-pay | Admitting: Physician Assistant

## 2023-06-13 DIAGNOSIS — F988 Other specified behavioral and emotional disorders with onset usually occurring in childhood and adolescence: Secondary | ICD-10-CM

## 2023-06-13 LAB — COMPREHENSIVE METABOLIC PANEL
ALT: 25 [IU]/L (ref 0–32)
AST: 38 [IU]/L (ref 0–40)
Albumin: 4.4 g/dL (ref 3.8–4.9)
Alkaline Phosphatase: 105 [IU]/L (ref 44–121)
BUN/Creatinine Ratio: 25 — ABNORMAL HIGH (ref 9–23)
BUN: 15 mg/dL (ref 6–24)
Bilirubin Total: 0.4 mg/dL (ref 0.0–1.2)
CO2: 25 mmol/L (ref 20–29)
Calcium: 9.2 mg/dL (ref 8.7–10.2)
Chloride: 99 mmol/L (ref 96–106)
Creatinine, Ser: 0.59 mg/dL (ref 0.57–1.00)
Globulin, Total: 2.6 g/dL (ref 1.5–4.5)
Glucose: 132 mg/dL — ABNORMAL HIGH (ref 70–99)
Potassium: 4.5 mmol/L (ref 3.5–5.2)
Sodium: 137 mmol/L (ref 134–144)
Total Protein: 7 g/dL (ref 6.0–8.5)
eGFR: 105 mL/min/{1.73_m2} (ref >59)

## 2023-06-13 LAB — LIPID PANEL WITH LDL/HDL RATIO
Cholesterol, Total: 166 mg/dL (ref 100–199)
HDL: 52 mg/dL (ref >39)
LDL Chol Calc (NIH): 91 mg/dL (ref 0–99)
LDL/HDL Ratio: 1.8 {ratio} (ref 0.0–3.2)
Triglycerides: 131 mg/dL (ref 0–149)
VLDL Cholesterol Cal: 23 mg/dL (ref 5–40)

## 2023-06-13 LAB — TSH+FREE T4
Free T4: 1.12 ng/dL (ref 0.82–1.77)
TSH: 2.85 u[IU]/mL (ref 0.450–4.500)

## 2023-06-13 LAB — VITAMIN D 25 HYDROXY (VIT D DEFICIENCY, FRACTURES): Vit D, 25-Hydroxy: 45.9 ng/mL (ref 30.0–100.0)

## 2023-06-13 LAB — HGB A1C W/O EAG: Hgb A1c MFr Bld: 6.6 % — ABNORMAL HIGH (ref 4.8–5.6)

## 2023-06-13 MED ORDER — LISDEXAMFETAMINE DIMESYLATE 30 MG PO CAPS: 30.0000 mg | ORAL_CAPSULE | Freq: Every day | ORAL | 0 refills | Status: DC

## 2023-06-24 ENCOUNTER — Ambulatory Visit (INDEPENDENT_AMBULATORY_CARE_PROVIDER_SITE_OTHER): Payer: BC Managed Care – PPO | Admitting: Physician Assistant

## 2023-06-24 ENCOUNTER — Encounter: Payer: Self-pay | Admitting: Physician Assistant

## 2023-06-24 VITALS — BP 140/80 | HR 89 | Temp 98.0°F | Resp 16 | Ht 60.0 in | Wt 167.4 lb

## 2023-06-24 DIAGNOSIS — E119 Type 2 diabetes mellitus without complications: Secondary | ICD-10-CM

## 2023-06-24 DIAGNOSIS — F988 Other specified behavioral and emotional disorders with onset usually occurring in childhood and adolescence: Secondary | ICD-10-CM | POA: Diagnosis not present

## 2023-06-24 DIAGNOSIS — F339 Major depressive disorder, recurrent, unspecified: Secondary | ICD-10-CM

## 2023-06-24 DIAGNOSIS — F411 Generalized anxiety disorder: Secondary | ICD-10-CM

## 2023-06-24 DIAGNOSIS — R300 Dysuria: Secondary | ICD-10-CM

## 2023-06-24 DIAGNOSIS — E66811 Obesity, class 1: Secondary | ICD-10-CM

## 2023-06-24 DIAGNOSIS — Z79899 Other long term (current) drug therapy: Secondary | ICD-10-CM

## 2023-06-24 LAB — POCT URINE DRUG SCREEN
Methylenedioxyamphetamine: NOT DETECTED
POC Amphetamine UR: POSITIVE — AB
POC BENZODIAZEPINES UR: NOT DETECTED
POC Barbiturate UR: NOT DETECTED
POC Cocaine UR: NOT DETECTED
POC Ecstasy UR: NOT DETECTED
POC Marijuana UR: NOT DETECTED
POC Methadone UR: NOT DETECTED
POC Methamphetamine UR: NOT DETECTED
POC Opiate Ur: NOT DETECTED
POC Oxycodone UR: NOT DETECTED
POC PHENCYCLIDINE UR: NOT DETECTED
POC TRICYCLICS UR: NOT DETECTED

## 2023-06-24 MED ORDER — ESTRADIOL 0.1 MG/GM VA CREA: TOPICAL_CREAM | VAGINAL | 3 refills | Status: DC

## 2023-06-24 MED ORDER — SEMAGLUTIDE(0.25 OR 0.5MG/DOS) 2 MG/3ML ~~LOC~~ SOPN: 0.2500 mg | PEN_INJECTOR | SUBCUTANEOUS | 2 refills | Status: DC

## 2023-06-24 NOTE — Progress Notes (Signed)
[facility] [address] [place], [place] [address]  Internal MEDICINE  Office Visit Note  Patient Name: Kristine Stephens  [id number]  [id number]  Date of Service: 06/24/2023  Chief Complaint  Patient presents with   Follow-up   Depression   Diabetes   Gastroesophageal Reflux   Quality Metric Gaps    Shingles Vaccine    HPI Pt is here for routine follow up -sweating some and thinks it is due to her prozac but doesn't want to change right now. Having more stress recently. Daughter is living with her and states her daughter is bipolar and will say things to her that are hurtful/blaming her mom for things. Discussed considering starting therapy and pt is interested in referral to psych to address possible med change and start therapy -Labs reviewed: Labs improving overall, but A1c worse. -Would like to try ozempic, no hx of thyroid cancer. States her sister is on wegovy and has done well with this and would like something to help weight and sugars. Discussed possible S/E -does see rheumatology  Current Medication: Outpatient Encounter Medications as of 06/24/2023  Medication Sig   b complex vitamins capsule Take 1 capsule by mouth daily.   etanercept (ENBREL) 50 MG/ML injection Inject 50 mg into the skin once a week.   etodolac (LODINE) 400 MG tablet TK 1 T PO BID   FLUoxetine (PROZAC) 40 MG capsule TAKE 1 CAPSULE(40 MG) BY MOUTH DAILY   hydrOXYzine (VISTARIL) 25 MG capsule Take 1 capsule (25 mg total) by mouth every 8 (eight) hours as needed. For anxiety   leflunomide (ARAVA) 10 MG tablet Take 10 mg by mouth daily.   lisdexamfetamine (VYVANSE) 30 MG capsule Take 1 capsule (30 mg total) by mouth daily.   magnesium oxide (MAG-OX) 400 (240 Mg) MG tablet Take 400 mg by mouth daily.   omeprazole (PRILOSEC) 40 MG capsule Take 1 capsule (40 mg total) by mouth daily.   rosuvastatin (CRESTOR) 5 MG tablet Take 1 tablet (5 mg total) by mouth daily.   Semaglutide,0.25 or  0.5MG /DOS, 2 MG/3ML SOPN Inject 0.25 mg into the skin once a week.   [DISCONTINUED] estradiol (ESTRACE) 0.1 MG/GM vaginal cream INSERT 1 APPLICATORFUL VAGINALLY 3 TIMES A WEEK   estradiol (ESTRACE) 0.1 MG/GM vaginal cream INSERT 1 APPLICATORFUL VAGINALLY 3 TIMES A WEEK   No facility-administered encounter medications on file as of 06/24/2023.    Surgical History: Past Surgical History:  Procedure Laterality Date   ASD REPAIR  1985   CARPAL TUNNEL RELEASE     CESAREAN SECTION     COLONOSCOPY WITH PROPOFOL N/A 04/11/2017   Procedure: COLONOSCOPY WITH PROPOFOL;  Surgeon: Wyline Mood, MD;  Location: [facility] ENDOSCOPY;  Service: Gastroenterology;  Laterality: N/A;   COLPOSCOPY  2012   ENDOMETRIAL ABLATION     Failed ablation x2 (Tech difficulties)   LAPAROSCOPIC VAGINAL HYSTERECTOMY WITH SALPINGO OOPHORECTOMY  2012   Adenomyosis for AUB   TONSILLECTOMY     TUBAL LIGATION      Medical History: Past Medical History:  Diagnosis Date   Anxiety    Depression    Diabetes mellitus without complication (HCC)    GERD (gastroesophageal reflux disease)    Psoriasis     Family History: Family History  Problem Relation Age of Onset   Breast cancer Paternal Grandmother 73   Multiple myeloma Father    Thyroid disease Paternal Aunt     Social History   Socioeconomic History   Marital status: Married  Spouse name: Not on file   Number of children: Not on file   Years of education: Not on file   Highest education level: Not on file  Occupational History   Not on file  Tobacco Use   Smoking status: Never   Smokeless tobacco: Never  Vaping Use   Vaping status: Never Used  Substance and Sexual Activity   Alcohol use: No   Drug use: No   Sexual activity: Yes    Partners: Male    Birth control/protection: None  Other Topics Concern   Not on file  Social History Narrative   Not on file   Social Drivers of Health   Financial Resource Strain: Not on file  Food Insecurity: Not on  file  Transportation Needs: Not on file  Physical Activity: Not on file  Stress: Not on file  Social Connections: Not on file  Intimate Partner Violence: Not on file      Review of Systems  Constitutional:  Negative for chills, fatigue and unexpected weight change.  HENT:  Negative for congestion, rhinorrhea, sneezing and sore throat.   Respiratory: Negative.  Negative for cough, chest tightness and shortness of breath.   Cardiovascular: Negative.  Negative for chest pain and palpitations.  Gastrointestinal:  Negative for abdominal pain, constipation, diarrhea, nausea and vomiting.  Musculoskeletal:  Positive for arthralgias. Negative for back pain, joint swelling and neck pain.  Skin:  Negative for rash.  Neurological: Negative.  Negative for tremors and numbness.  Psychiatric/Behavioral:  Positive for decreased concentration and dysphoric mood. Negative for behavioral problems (Depression), sleep disturbance and suicidal ideas. The patient is nervous/anxious.     Vital Signs: BP (!) 140/80   Pulse 89   Temp 98 F (36.7 C)   Resp 16   Ht 5' (1.524 m)   Wt 167 lb 6.4 oz (75.9 kg)   SpO2 98%   BMI 32.69 kg/m    Physical Exam Vitals and nursing note reviewed.  Constitutional:      General: She is not in acute distress.    Appearance: She is well-developed. She is obese. She is not diaphoretic.  HENT:     Head: Normocephalic and atraumatic.     Mouth/Throat:     Pharynx: No oropharyngeal exudate.  Eyes:     Pupils: Pupils are equal, round, and reactive to light.  Neck:     Thyroid: No thyromegaly.     Vascular: No JVD.     Trachea: No tracheal deviation.  Cardiovascular:     Rate and Rhythm: Normal rate and regular rhythm.     Heart sounds: Normal heart sounds. No murmur heard.    No friction rub. No gallop.  Pulmonary:     Effort: Pulmonary effort is normal. No respiratory distress.     Breath sounds: No wheezing or rales.  Chest:     Chest wall: No tenderness.   Abdominal:     General: Bowel sounds are normal.     Palpations: Abdomen is soft.  Musculoskeletal:        General: Normal range of motion.     Cervical back: Normal range of motion and neck supple.  Lymphadenopathy:     Cervical: No cervical adenopathy.  Skin:    General: Skin is warm and dry.  Neurological:     Mental Status: She is alert and oriented to person, place, and time.     Cranial Nerves: No cranial nerve deficit.  Psychiatric:  Behavior: Behavior normal.        Thought Content: Thought content normal.        Judgment: Judgment normal.        Assessment/Plan: 1. Type 2 diabetes mellitus without complication, without long-term current use of insulin (HCC) (Primary) Will start on ozempic weekly while working on diet and exercise - Urine Microalbumin w/creat. ratio - Semaglutide,0.25 or 0.5MG /DOS, 2 MG/3ML SOPN; Inject 0.25 mg into the skin once a week.  Dispense: 3 mL; Refill: 2  2. GAD (generalized anxiety disorder) Will refer to psych to address possible med changes and start on therapy as well - Ambulatory referral to Psychiatry  3. Attention deficit disorder (ADD) in adult - Ambulatory referral to Psychiatry  4. Episode of recurrent major depressive disorder, unspecified depression episode severity (HCC) - Ambulatory referral to Psychiatry  5. Dysuria Some blood on last urine and will recheck on this - UA/M w/rflx Culture, Routine  6. Encounter for long-term (current) use of medications - POCT Urine Drug Screen  7. Obesity (BMI 30.0-34.9) - Semaglutide,0.25 or 0.5MG /DOS, 2 MG/3ML SOPN; Inject 0.25 mg into the skin once a week.  Dispense: 3 mL; Refill: 2   General Counseling: Kristine Stephens verbalizes understanding of the findings of todays visit and agrees with plan of treatment. I have discussed any further diagnostic evaluation that may be needed or ordered today. We also reviewed her medications today. she has been encouraged to call the office with  any questions or concerns that should arise related to todays visit.    Orders Placed This Encounter  Procedures   Urine Microalbumin w/creat. ratio   UA/M w/rflx Culture, Routine   Ambulatory referral to Psychiatry   POCT Urine Drug Screen    Meds ordered this encounter  Medications   Semaglutide,0.25 or 0.5MG /DOS, 2 MG/3ML SOPN    Sig: Inject 0.25 mg into the skin once a week.    Dispense:  3 mL    Refill:  2   estradiol (ESTRACE) 0.1 MG/GM vaginal cream    Sig: INSERT 1 APPLICATORFUL VAGINALLY 3 TIMES A WEEK    Dispense:  42.5 g    Refill:  3    This patient was seen by Lynn Ito, PA-C in collaboration with Dr. Beverely Risen as a part of collaborative care agreement.   Total time spent:35 Minutes Time spent includes review of chart, medications, test results, and follow up plan with the patient.      Dr Lyndon Code Internal medicine

## 2023-06-25 ENCOUNTER — Telehealth: Payer: Self-pay | Admitting: Physician Assistant

## 2023-06-25 LAB — MICROALBUMIN / CREATININE URINE RATIO
Creatinine, Urine: 55 mg/dL
Microalb/Creat Ratio: 13 mg/g{creat} (ref 0–29)
Microalbumin, Urine: 7.4 ug/mL

## 2023-06-25 LAB — UA/M W/RFLX CULTURE, ROUTINE

## 2023-06-25 NOTE — Telephone Encounter (Signed)
 Psychiatry referral faxed to Dr. Maryruth Bun; [phone] Notified patient. Gave pt telephone [phone]

## 2023-06-26 ENCOUNTER — Telehealth: Payer: Self-pay

## 2023-06-26 NOTE — Telephone Encounter (Addendum)
Completed P.A. for patient's Ozempic. Patient has been denied even after appeal.

## 2023-06-27 LAB — UA/M W/RFLX CULTURE, ROUTINE
Specific Gravity, UA: 1.015 (ref 1.005–1.030)
Urobilinogen, Ur: 0.2 mg/dL (ref 0.2–1.0)
pH, UA: 8 — ABNORMAL HIGH (ref 5.0–7.5)

## 2023-06-27 LAB — MICROSCOPIC EXAMINATION
Casts: NONE SEEN /LPF
RBC, Urine: NONE SEEN /HPF (ref 0–2)
WBC, UA: NONE SEEN /HPF (ref 0–5)

## 2023-06-27 LAB — SPECIMEN STATUS REPORT

## 2023-06-27 LAB — URINE CULTURE, REFLEX

## 2023-07-01 ENCOUNTER — Telehealth: Payer: Self-pay

## 2023-07-01 ENCOUNTER — Other Ambulatory Visit: Payer: Self-pay

## 2023-07-01 MED ORDER — ESTRADIOL 0.1 MG/GM VA CREA: TOPICAL_CREAM | VAGINAL | 3 refills | Status: AC

## 2023-07-01 NOTE — Telephone Encounter (Signed)
Spoke with patient regarding urine culture results. 

## 2023-07-01 NOTE — Telephone Encounter (Signed)
-----   Message from Carlean Jews sent at 06/28/2023  1:17 PM EST ----- Trace leukocytes on urine but lab unable to complete culture. If any symptoms have pt redo urine

## 2023-07-15 ENCOUNTER — Other Ambulatory Visit: Payer: Self-pay | Admitting: Physician Assistant

## 2023-07-15 DIAGNOSIS — F988 Other specified behavioral and emotional disorders with onset usually occurring in childhood and adolescence: Secondary | ICD-10-CM

## 2023-07-15 MED ORDER — LISDEXAMFETAMINE DIMESYLATE 30 MG PO CAPS: 30.0000 mg | ORAL_CAPSULE | Freq: Every day | ORAL | 0 refills | Status: DC

## 2023-07-15 NOTE — Telephone Encounter (Signed)
 Please send

## 2023-07-29 ENCOUNTER — Ambulatory Visit (INDEPENDENT_AMBULATORY_CARE_PROVIDER_SITE_OTHER): Payer: BC Managed Care – PPO | Admitting: Physician Assistant

## 2023-07-29 ENCOUNTER — Encounter: Payer: Self-pay | Admitting: Physician Assistant

## 2023-07-29 VITALS — BP 120/80 | HR 93 | Temp 97.8°F | Resp 16 | Ht 60.0 in | Wt 167.0 lb

## 2023-07-29 DIAGNOSIS — F339 Major depressive disorder, recurrent, unspecified: Secondary | ICD-10-CM | POA: Diagnosis not present

## 2023-07-29 DIAGNOSIS — Z79899 Other long term (current) drug therapy: Secondary | ICD-10-CM

## 2023-07-29 DIAGNOSIS — F411 Generalized anxiety disorder: Secondary | ICD-10-CM | POA: Diagnosis not present

## 2023-07-29 DIAGNOSIS — E782 Mixed hyperlipidemia: Secondary | ICD-10-CM | POA: Diagnosis not present

## 2023-07-29 DIAGNOSIS — E119 Type 2 diabetes mellitus without complications: Secondary | ICD-10-CM | POA: Diagnosis not present

## 2023-07-29 DIAGNOSIS — F988 Other specified behavioral and emotional disorders with onset usually occurring in childhood and adolescence: Secondary | ICD-10-CM

## 2023-07-29 LAB — POCT URINE DRUG SCREEN
POC Amphetamine UR: POSITIVE — AB
POC BENZODIAZEPINES UR: NOT DETECTED
POC Barbiturate UR: NOT DETECTED
POC Cocaine UR: NOT DETECTED
POC Ecstasy UR: NOT DETECTED
POC Marijuana UR: NOT DETECTED
POC Methadone UR: NOT DETECTED
POC Methamphetamine UR: NOT DETECTED
POC Opiate Ur: NOT DETECTED
POC Oxycodone UR: NOT DETECTED
POC PHENCYCLIDINE UR: NOT DETECTED
POC TRICYCLICS UR: NOT DETECTED

## 2023-07-29 MED ORDER — TIRZEPATIDE 2.5 MG/0.5ML ~~LOC~~ SOAJ: 2.5000 mg | SUBCUTANEOUS | 2 refills | Status: DC

## 2023-07-29 MED ORDER — ROSUVASTATIN CALCIUM 5 MG PO TABS: 5.0000 mg | ORAL_TABLET | Freq: Every day | ORAL | 1 refills | Status: DC

## 2023-07-29 NOTE — Progress Notes (Signed)
[facility] [address] [place], [place] [address]  Internal MEDICINE  Office Visit Note  Patient Name: Kristine Stephens  [id number]  [id number]  Date of Service: 07/29/2023  Chief Complaint  Patient presents with   Follow-up   Depression   Anxiety    HPI Pt is here for routine follow up -Had tried for ozempic but this was denied. Will try for mounjaro instead as she is diabetic and is hoping to lose weight as well -Will be starting with psych, though appt not until April currently. Is awaiting the paperwork and is going to follow up on this and see if maybe she can get on waitlist to be seen sooner if available -continues to have stress with daughter living with her. States her daughter is in a manic phase and off of her medications and will blame her mom for things. She is managing, but is hoping she may be able to get her to see someone as well -doing well with vyvanse, not due for refill yet and will call when needed  Current Medication: Outpatient Encounter Medications as of 07/29/2023  Medication Sig   b complex vitamins capsule Take 1 capsule by mouth daily.   estradiol (ESTRACE) 0.1 MG/GM vaginal cream INSERT 1 APPLICATORFUL VAGINALLY 3 TIMES A WEEK   etanercept (ENBREL) 50 MG/ML injection Inject 50 mg into the skin once a week.   FLUoxetine (PROZAC) 40 MG capsule TAKE 1 CAPSULE(40 MG) BY MOUTH DAILY   folic acid (FOLVITE) 1 MG tablet Take 1 mg by mouth daily.   hydrOXYzine (VISTARIL) 25 MG capsule Take 1 capsule (25 mg total) by mouth every 8 (eight) hours as needed. For anxiety   leflunomide (ARAVA) 10 MG tablet Take 10 mg by mouth daily.   lisdexamfetamine (VYVANSE) 30 MG capsule Take 1 capsule (30 mg total) by mouth daily.   magnesium oxide (MAG-OX) 400 (240 Mg) MG tablet Take 400 mg by mouth daily.   methotrexate (RHEUMATREX) 2.5 MG tablet Take 2.5 mg by mouth once a week. Caution:Chemotherapy. Protect from light.   omeprazole (PRILOSEC) 40 MG capsule Take  1 capsule (40 mg total) by mouth daily.   tirzepatide [facility]) 2.5 MG/0.5ML Pen Inject 2.5 mg into the skin once a week.   [DISCONTINUED] etodolac (LODINE) 400 MG tablet TK 1 T PO BID   [DISCONTINUED] rosuvastatin (CRESTOR) 5 MG tablet Take 1 tablet (5 mg total) by mouth daily.   [DISCONTINUED] Semaglutide,0.25 or 0.5MG /DOS, 2 MG/3ML SOPN Inject 0.25 mg into the skin once a week.   rosuvastatin (CRESTOR) 5 MG tablet Take 1 tablet (5 mg total) by mouth daily.   No facility-administered encounter medications on file as of 07/29/2023.    Surgical History: Past Surgical History:  Procedure Laterality Date   ASD REPAIR  1985   CARPAL TUNNEL RELEASE     CESAREAN SECTION     COLONOSCOPY WITH PROPOFOL N/A 04/11/2017   Procedure: COLONOSCOPY WITH PROPOFOL;  Surgeon: Wyline Mood, MD;  Location: [facility] ENDOSCOPY;  Service: Gastroenterology;  Laterality: N/A;   COLPOSCOPY  2012   ENDOMETRIAL ABLATION     Failed ablation x2 (Tech difficulties)   LAPAROSCOPIC VAGINAL HYSTERECTOMY WITH SALPINGO OOPHORECTOMY  2012   Adenomyosis for AUB   TONSILLECTOMY     TUBAL LIGATION      Medical History: Past Medical History:  Diagnosis Date   Anxiety    Depression    Diabetes mellitus without complication (HCC)    GERD (gastroesophageal reflux disease)  Psoriasis     Family History: Family History  Problem Relation Age of Onset   Breast cancer Paternal Grandmother 89   Multiple myeloma Father    Thyroid disease Paternal Aunt     Social History   Socioeconomic History   Marital status: Married    Spouse name: Not on file   Number of children: Not on file   Years of education: Not on file   Highest education level: Not on file  Occupational History   Not on file  Tobacco Use   Smoking status: Never   Smokeless tobacco: Never  Vaping Use   Vaping status: Never Used  Substance and Sexual Activity   Alcohol use: No   Drug use: No   Sexual activity: Yes    Partners: Male    Birth  control/protection: None  Other Topics Concern   Not on file  Social History Narrative   Not on file   Social Drivers of Health   Financial Resource Strain: Not on file  Food Insecurity: Not on file  Transportation Needs: Not on file  Physical Activity: Not on file  Stress: Not on file  Social Connections: Not on file  Intimate Partner Violence: Not on file      Review of Systems  Constitutional:  Negative for chills, fatigue and unexpected weight change.  HENT:  Negative for congestion, rhinorrhea, sneezing and sore throat.   Respiratory: Negative.  Negative for cough, chest tightness and shortness of breath.   Cardiovascular: Negative.  Negative for chest pain and palpitations.  Gastrointestinal:  Negative for abdominal pain, constipation, diarrhea, nausea and vomiting.  Musculoskeletal:  Positive for arthralgias. Negative for back pain, joint swelling and neck pain.  Skin:  Negative for rash.  Neurological: Negative.  Negative for tremors and numbness.  Psychiatric/Behavioral:  Positive for decreased concentration and dysphoric mood. Negative for behavioral problems (Depression), sleep disturbance and suicidal ideas. The patient is nervous/anxious.     Vital Signs: BP 120/80   Pulse 93   Temp 97.8 F (36.6 C)   Resp 16   Ht 5' (1.524 m)   Wt 167 lb (75.8 kg)   SpO2 95%   BMI 32.61 kg/m    Physical Exam Vitals and nursing note reviewed.  Constitutional:      General: She is not in acute distress.    Appearance: She is well-developed. She is obese. She is not diaphoretic.  HENT:     Head: Normocephalic and atraumatic.     Mouth/Throat:     Pharynx: No oropharyngeal exudate.  Eyes:     Pupils: Pupils are equal, round, and reactive to light.  Neck:     Thyroid: No thyromegaly.     Vascular: No JVD.     Trachea: No tracheal deviation.  Cardiovascular:     Rate and Rhythm: Normal rate and regular rhythm.     Heart sounds: Normal heart sounds. No murmur  heard.    No friction rub. No gallop.  Pulmonary:     Effort: Pulmonary effort is normal. No respiratory distress.     Breath sounds: No wheezing or rales.  Chest:     Chest wall: No tenderness.  Abdominal:     General: Bowel sounds are normal.     Palpations: Abdomen is soft.  Musculoskeletal:        General: Normal range of motion.     Cervical back: Normal range of motion and neck supple.  Lymphadenopathy:     Cervical: No  cervical adenopathy.  Skin:    General: Skin is warm and dry.  Neurological:     Mental Status: She is alert and oriented to person, place, and time.     Cranial Nerves: No cranial nerve deficit.  Psychiatric:        Behavior: Behavior normal.        Thought Content: Thought content normal.        Judgment: Judgment normal.        Assessment/Plan: 1. Type 2 diabetes mellitus without complication, without long-term current use of insulin (HCC) (Primary) Will try for mounjaro, pt will call for sooner appt if approved to monitor response - tirzepatide [facility]) 2.5 MG/0.5ML Pen; Inject 2.5 mg into the skin once a week.  Dispense: 2 mL; Refill: 2  2. GAD (generalized anxiety disorder) Continue current medication and will be establishing with psych soon  3. Episode of recurrent major depressive disorder, unspecified depression episode severity (HCC) Continue current medication and will be establishing with psych soon  4. Mixed hyperlipidemia Continue crestor as before - rosuvastatin (CRESTOR) 5 MG tablet; Take 1 tablet (5 mg total) by mouth daily.  Dispense: 90 tablet; Refill: 1  5. Attention deficit disorder (ADD) in adult May continues vyvanse, establishing with psych soon  6. Encounter for long-term (current) use of medications - POCT Urine Drug Screen   General Counseling: Chaela verbalizes understanding of the findings of todays visit and agrees with plan of treatment. I have discussed any further diagnostic evaluation that may be needed or  ordered today. We also reviewed her medications today. she has been encouraged to call the office with any questions or concerns that should arise related to todays visit.    Orders Placed This Encounter  Procedures   POCT Urine Drug Screen    Meds ordered this encounter  Medications   tirzepatide (MOUNJARO) 2.5 MG/0.5ML Pen    Sig: Inject 2.5 mg into the skin once a week.    Dispense:  2 mL    Refill:  2   rosuvastatin (CRESTOR) 5 MG tablet    Sig: Take 1 tablet (5 mg total) by mouth daily.    Dispense:  90 tablet    Refill:  1    This patient was seen by Lynn Ito, PA-C in collaboration with Dr. Beverely Risen as a part of collaborative care agreement.   Total time spent:30 Minutes Time spent includes review of chart, medications, test results, and follow up plan with the patient.      Dr Lyndon Code Internal medicine

## 2023-08-05 DIAGNOSIS — L4050 Arthropathic psoriasis, unspecified: Secondary | ICD-10-CM | POA: Diagnosis not present

## 2023-08-05 DIAGNOSIS — Z79899 Other long term (current) drug therapy: Secondary | ICD-10-CM | POA: Diagnosis not present

## 2023-08-14 ENCOUNTER — Ambulatory Visit: Payer: BC Managed Care – PPO | Admitting: Obstetrics

## 2023-08-14 NOTE — Progress Notes (Deleted)
    GYNECOLOGY PROGRESS NOTE  Subjective:  PCP: Kristina Tinnie POUR, PA-C  Patient ID: Sari VEAR [place], female    DOB: 1966/05/23, 58 y.o.   MRN: [id number]  HPI  Patient is a 58 y.o. G10P3003 female who presents for refill on her Estradiol  cream, last annual was 05/16/2020, Last pap was NILM HPV negative  {Common ambulatory SmartLinks:19316}  Review of Systems {ros; complete:30496}   Objective:   There were no vitals taken for this visit. There is no height or weight on file to calculate BMI.  General appearance: {general exam:16600} Abdomen: {abdominal exam:16834} Pelvic: {pelvic exam:16852::cervix normal in appearance,external genitalia normal,no adnexal masses or tenderness,no cervical motion tenderness,rectovaginal septum normal,uterus normal size, shape, and consistency,vagina normal without discharge} Extremities: {extremity exam:5109} Neurologic: {neuro exam:17854}   Assessment/Plan:   No diagnosis found.   There are no diagnoses linked to this encounter.     Estil Mangle, DO [facility]

## 2023-08-17 ENCOUNTER — Other Ambulatory Visit: Payer: Self-pay | Admitting: Physician Assistant

## 2023-08-17 DIAGNOSIS — F988 Other specified behavioral and emotional disorders with onset usually occurring in childhood and adolescence: Secondary | ICD-10-CM

## 2023-08-19 MED ORDER — LISDEXAMFETAMINE DIMESYLATE 30 MG PO CAPS: 30.0000 mg | ORAL_CAPSULE | Freq: Every day | ORAL | 0 refills | Status: DC

## 2023-08-19 NOTE — Telephone Encounter (Signed)
 Next4/25

## 2023-09-04 ENCOUNTER — Encounter: Payer: Self-pay | Admitting: Nurse Practitioner

## 2023-09-04 DIAGNOSIS — Z1231 Encounter for screening mammogram for malignant neoplasm of breast: Secondary | ICD-10-CM

## 2023-09-10 ENCOUNTER — Other Ambulatory Visit: Payer: Self-pay | Admitting: Internal Medicine

## 2023-09-10 DIAGNOSIS — Z1231 Encounter for screening mammogram for malignant neoplasm of breast: Secondary | ICD-10-CM

## 2023-09-18 ENCOUNTER — Other Ambulatory Visit: Payer: Self-pay | Admitting: Physician Assistant

## 2023-09-18 DIAGNOSIS — F988 Other specified behavioral and emotional disorders with onset usually occurring in childhood and adolescence: Secondary | ICD-10-CM

## 2023-09-19 MED ORDER — LISDEXAMFETAMINE DIMESYLATE 30 MG PO CAPS: 30.0000 mg | ORAL_CAPSULE | Freq: Every day | ORAL | 0 refills | Status: DC

## 2023-09-19 NOTE — Telephone Encounter (Signed)
 Please review and send

## 2023-09-25 ENCOUNTER — Ambulatory Visit
Admission: RE | Admit: 2023-09-25 | Discharge: 2023-09-25 | Disposition: A | Discharge status: Home / Self Care | Source: Ambulatory Visit | Attending: Internal Medicine | Admitting: Internal Medicine

## 2023-09-25 DIAGNOSIS — Z1231 Encounter for screening mammogram for malignant neoplasm of breast: Secondary | ICD-10-CM | POA: Diagnosis not present

## 2023-10-22 ENCOUNTER — Other Ambulatory Visit: Payer: Self-pay | Admitting: Physician Assistant

## 2023-10-22 DIAGNOSIS — F988 Other specified behavioral and emotional disorders with onset usually occurring in childhood and adolescence: Secondary | ICD-10-CM

## 2023-10-22 MED ORDER — LISDEXAMFETAMINE DIMESYLATE 30 MG PO CAPS
30.0000 mg | ORAL_CAPSULE | Freq: Every day | ORAL | 0 refills | Status: DC
Start: 2023-10-22 — End: 2023-11-25

## 2023-10-22 NOTE — Telephone Encounter (Signed)
 Next 10/24/23

## 2023-10-24 ENCOUNTER — Encounter: Payer: Self-pay | Admitting: Physician Assistant

## 2023-10-24 ENCOUNTER — Ambulatory Visit (INDEPENDENT_AMBULATORY_CARE_PROVIDER_SITE_OTHER): Payer: BC Managed Care – PPO | Admitting: Physician Assistant

## 2023-10-24 VITALS — BP 128/80 | HR 86 | Temp 98.2°F | Resp 16 | Ht 60.0 in | Wt 167.0 lb

## 2023-10-24 DIAGNOSIS — F411 Generalized anxiety disorder: Secondary | ICD-10-CM

## 2023-10-24 DIAGNOSIS — E119 Type 2 diabetes mellitus without complications: Secondary | ICD-10-CM | POA: Diagnosis not present

## 2023-10-24 DIAGNOSIS — F339 Major depressive disorder, recurrent, unspecified: Secondary | ICD-10-CM

## 2023-10-24 DIAGNOSIS — F988 Other specified behavioral and emotional disorders with onset usually occurring in childhood and adolescence: Secondary | ICD-10-CM | POA: Diagnosis not present

## 2023-10-24 MED ORDER — TIRZEPATIDE 2.5 MG/0.5ML ~~LOC~~ SOAJ: 2.5000 mg | SUBCUTANEOUS | 2 refills | Status: DC

## 2023-10-24 NOTE — Progress Notes (Signed)
 [facility] [address] [place], [place] [address]  Internal MEDICINE  Office Visit Note  Patient Name: Kristine Stephens  [id number]  [id number]  Date of Service: 10/24/2023  Chief Complaint  Patient presents with   Follow-up   Depression   Diabetes   Gastroesophageal Reflux    HPI Pt is here for routine follow up -Mammogram done -Doing well with vyvanse -Will be seeing Dr. Bradford Cadet this month for psychiatry. Pt continues to struggle with stress from her daughter. She states her daughter is bipolar, is not on medications, and is currently living with her and putting her down. She states her daughter is also recently pregnant -pollen bothering her some -had A1c done with work, will look for result. States it was under 7 -never got mounjaro--will resend  Current Medication: Outpatient Encounter Medications as of 10/24/2023  Medication Sig   b complex vitamins capsule Take 1 capsule by mouth daily.   estradiol (ESTRACE) 0.1 MG/GM vaginal cream INSERT 1 APPLICATORFUL VAGINALLY 3 TIMES A WEEK   etanercept (ENBREL) 50 MG/ML injection Inject 50 mg into the skin once a week.   FLUoxetine (PROZAC) 40 MG capsule TAKE 1 CAPSULE(40 MG) BY MOUTH DAILY   folic acid (FOLVITE) 1 MG tablet Take 1 mg by mouth daily.   hydrOXYzine (VISTARIL) 25 MG capsule Take 1 capsule (25 mg total) by mouth every 8 (eight) hours as needed. For anxiety   leflunomide (ARAVA) 10 MG tablet Take 10 mg by mouth daily.   lisdexamfetamine (VYVANSE) 30 MG capsule Take 1 capsule (30 mg total) by mouth daily.   magnesium oxide (MAG-OX) 400 (240 Mg) MG tablet Take 400 mg by mouth daily.   methotrexate (RHEUMATREX) 2.5 MG tablet Take 2.5 mg by mouth once a week. Caution:Chemotherapy. Protect from light.   omeprazole (PRILOSEC) 40 MG capsule Take 1 capsule (40 mg total) by mouth daily.   rosuvastatin (CRESTOR) 5 MG tablet Take 1 tablet (5 mg total) by mouth daily.   [DISCONTINUED] tirzepatide (MOUNJARO) 2.5  MG/0.5ML Pen Inject 2.5 mg into the skin once a week.   tirzepatide (MOUNJARO) 2.5 MG/0.5ML Pen Inject 2.5 mg into the skin once a week.   No facility-administered encounter medications on file as of 10/24/2023.    Surgical History: Past Surgical History:  Procedure Laterality Date   ASD REPAIR  1985   CARPAL TUNNEL RELEASE     CESAREAN SECTION     COLONOSCOPY WITH PROPOFOL N/A 04/11/2017   Procedure: COLONOSCOPY WITH PROPOFOL;  Surgeon: Luke Salaam, MD;  Location: [facility] ENDOSCOPY;  Service: Gastroenterology;  Laterality: N/A;   COLPOSCOPY  2012   ENDOMETRIAL ABLATION     Failed ablation x2 (Tech difficulties)   LAPAROSCOPIC VAGINAL HYSTERECTOMY WITH SALPINGO OOPHORECTOMY  2012   Adenomyosis for AUB   TONSILLECTOMY     TUBAL LIGATION      Medical History: Past Medical History:  Diagnosis Date   Anxiety    Depression    Diabetes mellitus without complication (HCC)    GERD (gastroesophageal reflux disease)    Psoriasis     Family History: Family History  Problem Relation Age of Onset   Breast cancer Paternal Grandmother 107   Multiple myeloma Father    Thyroid disease Paternal Aunt     Social History   Socioeconomic History   Marital status: Married    Spouse name: Not on file   Number of children: Not on file   Years of education: Not on file   Highest  education level: Not on file  Occupational History   Not on file  Tobacco Use   Smoking status: Never   Smokeless tobacco: Never  Vaping Use   Vaping status: Never Used  Substance and Sexual Activity   Alcohol use: No   Drug use: No   Sexual activity: Yes    Partners: Male    Birth control/protection: None  Other Topics Concern   Not on file  Social History Narrative   Not on file   Social Drivers of Health   Financial Resource Strain: Not on file  Food Insecurity: Not on file  Transportation Needs: Not on file  Physical Activity: Not on file  Stress: Not on file  Social Connections: Not on file   Intimate Partner Violence: Not on file      Review of Systems  Constitutional:  Negative for chills, fatigue and unexpected weight change.  HENT:  Positive for postnasal drip. Negative for congestion, rhinorrhea, sneezing and sore throat.   Eyes:  Positive for itching.  Respiratory: Negative.  Negative for cough, chest tightness and shortness of breath.   Cardiovascular: Negative.  Negative for chest pain and palpitations.  Gastrointestinal:  Negative for abdominal pain, constipation, diarrhea, nausea and vomiting.  Musculoskeletal:  Positive for arthralgias. Negative for back pain, joint swelling and neck pain.  Skin:  Negative for rash.  Allergic/Immunologic: Positive for environmental allergies.  Neurological: Negative.  Negative for tremors and numbness.  Psychiatric/Behavioral:  Positive for decreased concentration and dysphoric mood. Negative for behavioral problems (Depression), sleep disturbance and suicidal ideas. The patient is nervous/anxious.     Vital Signs: BP 128/80   Pulse 86   Temp 98.2 F (36.8 C)   Resp 16   Ht 5' (1.524 m)   Wt 167 lb (75.8 kg)   SpO2 96%   BMI 32.61 kg/m    Physical Exam Vitals and nursing note reviewed.  Constitutional:      General: She is not in acute distress.    Appearance: She is well-developed. She is obese. She is not diaphoretic.  HENT:     Head: Normocephalic and atraumatic.  Neck:     Thyroid: No thyromegaly.     Vascular: No JVD.     Trachea: No tracheal deviation.  Cardiovascular:     Rate and Rhythm: Normal rate and regular rhythm.     Heart sounds: Normal heart sounds. No murmur heard.    No friction rub. No gallop.  Pulmonary:     Effort: Pulmonary effort is normal. No respiratory distress.     Breath sounds: No wheezing or rales.  Chest:     Chest wall: No tenderness.  Musculoskeletal:        General: Normal range of motion.  Skin:    General: Skin is warm and dry.  Neurological:     Mental Status: She  is alert.  Psychiatric:        Behavior: Behavior normal.        Thought Content: Thought content normal.        Judgment: Judgment normal.        Assessment/Plan: 1. Type 2 diabetes mellitus without complication, without long-term current use of insulin (HCC) (Primary) A1c done by outside facility. Will resend mounjaro - tirzepatide (MOUNJARO) 2.5 MG/0.5ML Pen; Inject 2.5 mg into the skin once a week.  Dispense: 2 mL; Refill: 2  2. GAD (generalized anxiety disorder) Will be establishing with psych later this month  3. Episode of recurrent major  depressive disorder, unspecified depression episode severity (HCC) Will be establishing with psych later this month  4. Attention deficit disorder (ADD) in adult Will be establishing with psych later this month   General Counseling: kenia teagarden understanding of the findings of todays visit and agrees with plan of treatment. I have discussed any further diagnostic evaluation that may be needed or ordered today. We also reviewed her medications today. she has been encouraged to call the office with any questions or concerns that should arise related to todays visit.    No orders of the defined types were placed in this encounter.   Meds ordered this encounter  Medications   tirzepatide (MOUNJARO) 2.5 MG/0.5ML Pen    Sig: Inject 2.5 mg into the skin once a week.    Dispense:  2 mL    Refill:  2    This patient was seen by Kraemer Favia, PA-C in collaboration with Dr. Verneta Gone as a part of collaborative care agreement.   Total time spent:30 Minutes Time spent includes review of chart, medications, test results, and follow up plan with the patient.      Dr Fozia M Khan Internal medicine

## 2023-11-06 DIAGNOSIS — Z91199 Patient's noncompliance with other medical treatment and regimen due to unspecified reason: Secondary | ICD-10-CM | POA: Diagnosis not present

## 2023-11-06 DIAGNOSIS — L4050 Arthropathic psoriasis, unspecified: Secondary | ICD-10-CM | POA: Diagnosis not present

## 2023-11-06 DIAGNOSIS — Z79899 Other long term (current) drug therapy: Secondary | ICD-10-CM | POA: Diagnosis not present

## 2023-11-12 ENCOUNTER — Other Ambulatory Visit: Payer: Self-pay | Admitting: Physician Assistant

## 2023-11-12 DIAGNOSIS — F339 Major depressive disorder, recurrent, unspecified: Secondary | ICD-10-CM

## 2023-11-25 ENCOUNTER — Telehealth: Payer: Self-pay

## 2023-11-25 ENCOUNTER — Other Ambulatory Visit: Payer: Self-pay | Admitting: Physician Assistant

## 2023-11-25 DIAGNOSIS — F988 Other specified behavioral and emotional disorders with onset usually occurring in childhood and adolescence: Secondary | ICD-10-CM

## 2023-11-25 MED ORDER — LISDEXAMFETAMINE DIMESYLATE 30 MG PO CAPS: 30.0000 mg | ORAL_CAPSULE | Freq: Every day | ORAL | 0 refills | Status: DC

## 2023-11-25 NOTE — Telephone Encounter (Signed)
Lmom that we sent med 

## 2023-11-25 NOTE — Telephone Encounter (Signed)
 sent

## 2023-12-05 ENCOUNTER — Ambulatory Visit: Admitting: Physician Assistant

## 2023-12-05 ENCOUNTER — Telehealth: Payer: Self-pay

## 2023-12-05 ENCOUNTER — Telehealth: Payer: Self-pay | Admitting: Physician Assistant

## 2023-12-05 ENCOUNTER — Encounter: Payer: Self-pay | Admitting: Physician Assistant

## 2023-12-05 VITALS — BP 115/80 | HR 87 | Temp 97.6°F | Resp 16 | Ht 60.0 in | Wt 161.6 lb

## 2023-12-05 DIAGNOSIS — E119 Type 2 diabetes mellitus without complications: Secondary | ICD-10-CM | POA: Diagnosis not present

## 2023-12-05 DIAGNOSIS — Z1283 Encounter for screening for malignant neoplasm of skin: Secondary | ICD-10-CM | POA: Diagnosis not present

## 2023-12-05 DIAGNOSIS — F988 Other specified behavioral and emotional disorders with onset usually occurring in childhood and adolescence: Secondary | ICD-10-CM

## 2023-12-05 DIAGNOSIS — F411 Generalized anxiety disorder: Secondary | ICD-10-CM | POA: Diagnosis not present

## 2023-12-05 LAB — POCT GLYCOSYLATED HEMOGLOBIN (HGB A1C): Hemoglobin A1C: 5.9 % — AB (ref 4.0–5.6)

## 2023-12-05 NOTE — Telephone Encounter (Signed)
 Referral to [facility] cancelled per patient since they are not taking new patients at this time. She stated she will go to previous dermatologist-[facility]

## 2023-12-05 NOTE — Progress Notes (Signed)
 [facility] [address] [place], [place] [address]  Internal MEDICINE  Office Visit Note  Patient Name: Kristine Stephens  [id number]  [id number]  Date of Service: 12/25/2023  Chief Complaint  Patient presents with   Follow-up   Depression   Diabetes   Gastroesophageal Reflux    HPI Pt is here for routine follow up -down 6lbs since last visit, was able to get mounjaro  and would like to continue at current dose -Will be seeing Dr. Bradford Cadet sometime soon for anxiety and adhd -doing well with vyvanse , no side effects -reports daughter is going to be having a baby on her own. Reports still a difficult situation with her daughter in her home and is looking forward with speaking with psych about this further -would like refer to dermatology for full skin check and a few moles as well as some itchy skin.  Current Medication: Outpatient Encounter Medications as of 12/05/2023  Medication Sig   b complex vitamins capsule Take 1 capsule by mouth daily.   estradiol  (ESTRACE ) 0.1 MG/GM vaginal cream INSERT 1 APPLICATORFUL VAGINALLY 3 TIMES A WEEK   etanercept (ENBREL) 50 MG/ML injection Inject 50 mg into the skin once a week.   FLUoxetine  (PROZAC ) 40 MG capsule TAKE 1 CAPSULE(40 MG) BY MOUTH DAILY   folic acid (FOLVITE) 1 MG tablet Take 1 mg by mouth daily.   hydrOXYzine  (VISTARIL ) 25 MG capsule Take 1 capsule (25 mg total) by mouth every 8 (eight) hours as needed. For anxiety   leflunomide (ARAVA) 10 MG tablet Take 10 mg by mouth daily.   magnesium oxide (MAG-OX) 400 (240 Mg) MG tablet Take 400 mg by mouth daily.   methotrexate (RHEUMATREX) 2.5 MG tablet Take 2.5 mg by mouth once a week. Caution:Chemotherapy. Protect from light.   omeprazole  (PRILOSEC) 40 MG capsule Take 1 capsule (40 mg total) by mouth daily.   rosuvastatin  (CRESTOR ) 5 MG tablet Take 1 tablet (5 mg total) by mouth daily.   [DISCONTINUED] lisdexamfetamine (VYVANSE ) 30 MG capsule Take 1 capsule (30 mg total) by mouth  daily.   [DISCONTINUED] tirzepatide  (MOUNJARO ) 2.5 MG/0.5ML Pen Inject 2.5 mg into the skin once a week.   No facility-administered encounter medications on file as of 12/05/2023.    Surgical History: Past Surgical History:  Procedure Laterality Date   ASD REPAIR  1985   CARPAL TUNNEL RELEASE     CESAREAN SECTION     COLONOSCOPY WITH PROPOFOL  N/A 04/11/2017   Procedure: COLONOSCOPY WITH PROPOFOL ;  Surgeon: Luke Salaam, MD;  Location: [facility] ENDOSCOPY;  Service: Gastroenterology;  Laterality: N/A;   COLPOSCOPY  2012   ENDOMETRIAL ABLATION     Failed ablation x2 (Tech difficulties)   LAPAROSCOPIC VAGINAL HYSTERECTOMY WITH SALPINGO OOPHORECTOMY  2012   Adenomyosis for AUB   TONSILLECTOMY     TUBAL LIGATION      Medical History: Past Medical History:  Diagnosis Date   Anxiety    Depression    Diabetes mellitus without complication (HCC)    GERD (gastroesophageal reflux disease)    Psoriasis     Family History: Family History  Problem Relation Age of Onset   Breast cancer Paternal Grandmother 4   Multiple myeloma Father    Thyroid  disease Paternal Aunt     Social History   Socioeconomic History   Marital status: Married    Spouse name: Not on file   Number of children: Not on file   Years of education: Not on file   Highest  education level: Not on file  Occupational History   Not on file  Tobacco Use   Smoking status: Never   Smokeless tobacco: Never  Vaping Use   Vaping status: Never Used  Substance and Sexual Activity   Alcohol use: No   Drug use: No   Sexual activity: Yes    Partners: Male    Birth control/protection: None  Other Topics Concern   Not on file  Social History Narrative   Not on file   Social Drivers of Health   Financial Resource Strain: Not on file  Food Insecurity: Not on file  Transportation Needs: Not on file  Physical Activity: Not on file  Stress: Not on file  Social Connections: Not on file  Intimate Partner Violence: Not on  file      Review of Systems  Constitutional:  Negative for chills, fatigue and unexpected weight change.  HENT:  Positive for postnasal drip. Negative for congestion, rhinorrhea, sneezing and sore throat.   Respiratory: Negative.  Negative for cough, chest tightness and shortness of breath.   Cardiovascular: Negative.  Negative for chest pain and palpitations.  Gastrointestinal:  Negative for abdominal pain, constipation, diarrhea, nausea and vomiting.  Musculoskeletal:  Positive for arthralgias. Negative for back pain, joint swelling and neck pain.  Skin:  Negative for rash.  Neurological: Negative.  Negative for tremors and numbness.  Psychiatric/Behavioral:  Positive for decreased concentration and dysphoric mood. Negative for behavioral problems (Depression), sleep disturbance and suicidal ideas. The patient is nervous/anxious.     Vital Signs: BP 115/80   Pulse 87   Temp 97.6 F (36.4 C)   Resp 16   Ht 5' (1.524 m)   Wt 161 lb 9.6 oz (73.3 kg)   SpO2 97%   BMI 31.56 kg/m    Physical Exam Vitals and nursing note reviewed.  Constitutional:      General: She is not in acute distress.    Appearance: She is well-developed. She is obese. She is not diaphoretic.  HENT:     Head: Normocephalic and atraumatic.  Neck:     Thyroid : No thyromegaly.     Vascular: No JVD.     Trachea: No tracheal deviation.   Cardiovascular:     Rate and Rhythm: Normal rate and regular rhythm.     Heart sounds: Normal heart sounds. No murmur heard.    No friction rub. No gallop.  Pulmonary:     Effort: Pulmonary effort is normal. No respiratory distress.     Breath sounds: No wheezing or rales.  Chest:     Chest wall: No tenderness.   Musculoskeletal:        General: Normal range of motion.   Skin:    General: Skin is warm and dry.   Neurological:     Mental Status: She is alert.   Psychiatric:        Behavior: Behavior normal.        Thought Content: Thought content normal.         Judgment: Judgment normal.        Assessment/Plan: 1. Type 2 diabetes mellitus without complication, without long-term current use of insulin (HCC) (Primary) - POCT HgB A1C is 5.9 which is improved from 6.6 last visit. Doing well with mounjaro  and will continue  2. Screening exam for skin cancer - Ambulatory referral to Dermatology  3. Attention deficit disorder (ADD) in adult May continue vyvyanse, awaiting psych appt still  4. GAD (generalized anxiety disorder) May  continue prozac , awaiting psych appt still   General Counseling: eunique balik understanding of the findings of todays visit and agrees with plan of treatment. I have discussed any further diagnostic evaluation that may be needed or ordered today. We also reviewed her medications today. she has been encouraged to call the office with any questions or concerns that should arise related to todays visit.    Orders Placed This Encounter  Procedures   Ambulatory referral to Dermatology   POCT HgB A1C    No orders of the defined types were placed in this encounter.   This patient was seen by Jimerson Favia, PA-C in collaboration with Dr. Verneta Gone as a part of collaborative care agreement.   Total time spent:30 Minutes Time spent includes review of chart, medications, test results, and follow up plan with the patient.      Dr Fozia M Khan Internal medicine

## 2023-12-05 NOTE — Telephone Encounter (Signed)
 Completed P.A. for patient's Mounjaro.

## 2023-12-23 ENCOUNTER — Other Ambulatory Visit: Payer: Self-pay

## 2023-12-23 DIAGNOSIS — E119 Type 2 diabetes mellitus without complications: Secondary | ICD-10-CM

## 2023-12-23 MED ORDER — TIRZEPATIDE 2.5 MG/0.5ML ~~LOC~~ SOAJ: 2.5000 mg | SUBCUTANEOUS | 2 refills | Status: DC

## 2023-12-24 ENCOUNTER — Telehealth: Payer: Self-pay

## 2023-12-24 ENCOUNTER — Other Ambulatory Visit: Payer: Self-pay | Admitting: Physician Assistant

## 2023-12-24 DIAGNOSIS — F988 Other specified behavioral and emotional disorders with onset usually occurring in childhood and adolescence: Secondary | ICD-10-CM

## 2023-12-24 MED ORDER — LISDEXAMFETAMINE DIMESYLATE 30 MG PO CAPS: 30.0000 mg | ORAL_CAPSULE | Freq: Every day | ORAL | 0 refills | Status: DC

## 2023-12-24 NOTE — Telephone Encounter (Signed)
 Patient notified

## 2023-12-31 ENCOUNTER — Telehealth: Payer: Self-pay | Admitting: Physician Assistant

## 2023-12-31 DIAGNOSIS — F330 Major depressive disorder, recurrent, mild: Secondary | ICD-10-CM | POA: Diagnosis not present

## 2023-12-31 DIAGNOSIS — F411 Generalized anxiety disorder: Secondary | ICD-10-CM | POA: Diagnosis not present

## 2023-12-31 NOTE — Telephone Encounter (Signed)
 Left vm and sent mychart message to confirm 01/06/24 appointment-[facility]

## 2024-01-06 ENCOUNTER — Ambulatory Visit (INDEPENDENT_AMBULATORY_CARE_PROVIDER_SITE_OTHER): Payer: BC Managed Care – PPO | Admitting: Physician Assistant

## 2024-01-06 ENCOUNTER — Encounter: Payer: Self-pay | Admitting: Physician Assistant

## 2024-01-06 VITALS — BP 110/70 | HR 86 | Temp 98.4°F | Resp 16 | Ht 60.0 in | Wt 164.4 lb

## 2024-01-06 DIAGNOSIS — F411 Generalized anxiety disorder: Secondary | ICD-10-CM

## 2024-01-06 DIAGNOSIS — F988 Other specified behavioral and emotional disorders with onset usually occurring in childhood and adolescence: Secondary | ICD-10-CM | POA: Diagnosis not present

## 2024-01-06 DIAGNOSIS — E119 Type 2 diabetes mellitus without complications: Secondary | ICD-10-CM

## 2024-01-06 DIAGNOSIS — F339 Major depressive disorder, recurrent, unspecified: Secondary | ICD-10-CM

## 2024-01-06 DIAGNOSIS — E66811 Obesity, class 1: Secondary | ICD-10-CM

## 2024-01-06 DIAGNOSIS — Z0001 Encounter for general adult medical examination with abnormal findings: Secondary | ICD-10-CM

## 2024-01-06 MED ORDER — TIRZEPATIDE 5 MG/0.5ML ~~LOC~~ SOAJ: 5.0000 mg | SUBCUTANEOUS | 1 refills | Status: DC

## 2024-01-06 NOTE — Progress Notes (Signed)
 [facility] [address] [place], [place] [address]  Internal MEDICINE  Office Visit Note  Patient Name: Kristine Stephens  [id number]  [id number]  Date of Service: 01/06/2024  Chief Complaint  Patient presents with   Annual Exam   Diabetes   Depression   Gastroesophageal Reflux     HPI Pt is here for routine health maintenance examination -has been out of mounjaro  for almost 1 month, states she was told she would have to increase dose for it to be covered. Will send 5mg  weekly dose and give pt sample of 2.5 to restart prior to avoid GI upset since being without injection for a few weeks -Bp stable -did stop vyvanse  because of concerns related to eye hemorrhage and warning by eye doctor. BP stable every office visit -did see psychiatry, weaning off of prozac  and starting to titrate cymbalta . Will also be having adhd testing as well-[facility] attention specialist in [place] -foot exam normal -will be getting shingles second dose -mammogram, pap, and colonoscopy UTD  Current Medication: Outpatient Encounter Medications as of 01/06/2024  Medication Sig   b complex vitamins capsule Take 1 capsule by mouth daily.   estradiol  (ESTRACE ) 0.1 MG/GM vaginal cream INSERT 1 APPLICATORFUL VAGINALLY 3 TIMES A WEEK   etanercept (ENBREL) 50 MG/ML injection Inject 50 mg into the skin once a week.   FLUoxetine  (PROZAC ) 40 MG capsule TAKE 1 CAPSULE(40 MG) BY MOUTH DAILY   folic acid (FOLVITE) 1 MG tablet Take 1 mg by mouth daily.   hydrOXYzine  (VISTARIL ) 25 MG capsule Take 1 capsule (25 mg total) by mouth every 8 (eight) hours as needed. For anxiety   leflunomide (ARAVA) 10 MG tablet Take 10 mg by mouth daily.   lisdexamfetamine (VYVANSE ) 30 MG capsule Take 1 capsule (30 mg total) by mouth daily.   magnesium oxide (MAG-OX) 400 (240 Mg) MG tablet Take 400 mg by mouth daily.   methotrexate (RHEUMATREX) 2.5 MG tablet Take 2.5 mg by mouth once a week. Caution:Chemotherapy. Protect from  light.   omeprazole  (PRILOSEC) 40 MG capsule Take 1 capsule (40 mg total) by mouth daily.   rosuvastatin  (CRESTOR ) 5 MG tablet Take 1 tablet (5 mg total) by mouth daily.   tirzepatide  (MOUNJARO ) 5 MG/0.5ML Pen Inject 5 mg into the skin once a week.   [DISCONTINUED] tirzepatide  (MOUNJARO ) 2.5 MG/0.5ML Pen Inject 2.5 mg into the skin once a week.   No facility-administered encounter medications on file as of 01/06/2024.    Surgical History: Past Surgical History:  Procedure Laterality Date   ASD REPAIR  1985   CARPAL TUNNEL RELEASE     CESAREAN SECTION     COLONOSCOPY WITH PROPOFOL  N/A 04/11/2017   Procedure: COLONOSCOPY WITH PROPOFOL ;  Surgeon: Therisa Bi, MD;  Location: [facility] ENDOSCOPY;  Service: Gastroenterology;  Laterality: N/A;   COLPOSCOPY  2012   ENDOMETRIAL ABLATION     Failed ablation x2 (Tech difficulties)   LAPAROSCOPIC VAGINAL HYSTERECTOMY WITH SALPINGO OOPHORECTOMY  2012   Adenomyosis for AUB   TONSILLECTOMY     TUBAL LIGATION      Medical History: Past Medical History:  Diagnosis Date   Anxiety    Depression    Diabetes mellitus without complication (HCC)    GERD (gastroesophageal reflux disease)    Psoriasis     Family History: Family History  Problem Relation Age of Onset   Breast cancer Paternal Grandmother 8   Multiple myeloma Father    Thyroid  disease Paternal Aunt  Review of Systems  Constitutional:  Negative for chills, fatigue and unexpected weight change.  HENT:  Negative for congestion, rhinorrhea, sneezing and sore throat.   Respiratory: Negative.  Negative for cough, chest tightness and shortness of breath.   Cardiovascular: Negative.  Negative for chest pain and palpitations.  Gastrointestinal:  Negative for abdominal pain, constipation, diarrhea, nausea and vomiting.  Musculoskeletal:  Positive for arthralgias. Negative for back pain, joint swelling and neck pain.  Skin:  Negative for rash.  Neurological: Negative.  Negative for  tremors and numbness.  Psychiatric/Behavioral:  Positive for decreased concentration and dysphoric mood. Negative for behavioral problems (Depression), sleep disturbance and suicidal ideas. The patient is nervous/anxious.      Vital Signs: BP 110/70   Pulse 86   Temp 98.4 F (36.9 C)   Resp 16   Ht 5' (1.524 m)   Wt 164 lb 6.4 oz (74.6 kg)   SpO2 98%   BMI 32.11 kg/m    Physical Exam Vitals and nursing note reviewed.  Constitutional:      General: She is not in acute distress.    Appearance: She is well-developed. She is obese. She is not diaphoretic.  HENT:     Head: Normocephalic and atraumatic.     Mouth/Throat:     Pharynx: No posterior oropharyngeal erythema.   Eyes:     Extraocular Movements: Extraocular movements intact.   Neck:     Thyroid : No thyromegaly.     Vascular: No JVD.     Trachea: No tracheal deviation.   Cardiovascular:     Rate and Rhythm: Normal rate and regular rhythm.     Pulses:          Dorsalis pedis pulses are 3+ on the right side and 3+ on the left side.       Posterior tibial pulses are 3+ on the right side and 3+ on the left side.     Heart sounds: Normal heart sounds. No murmur heard.    No friction rub. No gallop.  Pulmonary:     Effort: Pulmonary effort is normal. No respiratory distress.     Breath sounds: No wheezing or rales.  Chest:     Chest wall: No tenderness.  Abdominal:     Palpations: Abdomen is soft.     Tenderness: There is no abdominal tenderness.   Musculoskeletal:        General: Normal range of motion.     Cervical back: Normal range of motion and neck supple.     Right foot: Normal range of motion.     Left foot: Normal range of motion.  Feet:     Right foot:     Protective Sensation: 2 sites tested.  2 sites sensed.     Skin integrity: Skin integrity normal.     Toenail Condition: Right toenails are normal.     Left foot:     Protective Sensation: 2 sites tested.  2 sites sensed.     Skin integrity:  Skin integrity normal.     Toenail Condition: Left toenails are normal.   Skin:    General: Skin is warm and dry.   Neurological:     Mental Status: She is alert and oriented to person, place, and time.   Psychiatric:        Behavior: Behavior normal.        Thought Content: Thought content normal.        Judgment: Judgment normal.  LABS: Recent Results (from the past 2160 hours)  POCT HgB A1C     Status: Abnormal   Collection Time: 12/05/23  4:04 PM  Result Value Ref Range   Hemoglobin A1C 5.9 (A) 4.0 - 5.6 %   HbA1c POC (<> result, manual entry)     HbA1c, POC (prediabetic range)     HbA1c, POC (controlled diabetic range)          Assessment/Plan: 1. Encounter for general adult medical examination with abnormal findings (Primary) CPE performed, UTD on pap, mammogram and colonoscopy  2. Type 2 diabetes mellitus without complication, without long-term current use of insulin (HCC) Will give sample of 2.5 mg mounjaro  to restart use prior to increasing to 5mg  per insurance limitation on lowest dose. - tirzepatide  (MOUNJARO ) 5 MG/0.5ML Pen; Inject 5 mg into the skin once a week.  Dispense: 6 mL; Refill: 1  3. Attention deficit disorder (ADD) in adult Holding vyvanse  and will be having formal testing with psychiatry  4. GAD (generalized anxiety disorder) Followed by psychiatry now and will be establishing with therapist as well  5. Episode of recurrent major depressive disorder, unspecified depression episode severity (HCC) Followed by psychiatry now and will be establishing with therapist as well  6. Obesity (BMI 30.0-34.9) Will restart mounjaro  for BG and wt loss benefits   General Counseling: cynithia hakimi understanding of the findings of todays visit and agrees with plan of treatment. I have discussed any further diagnostic evaluation that may be needed or ordered today. We also reviewed her medications today. she has been encouraged to call the office with  any questions or concerns that should arise related to todays visit.    Counseling:    No orders of the defined types were placed in this encounter.   Meds ordered this encounter  Medications   tirzepatide  (MOUNJARO ) 5 MG/0.5ML Pen    Sig: Inject 5 mg into the skin once a week.    Dispense:  6 mL    Refill:  1    This patient was seen by Tinnie Pro, PA-C in collaboration with Dr. Sigrid Bathe as a part of collaborative care agreement.  Total time spent:35 Minutes  Time spent includes review of chart, medications, test results, and follow up plan with the patient.     Sigrid CHRISTELLA Bathe, MD  Internal Medicine

## 2024-01-15 DIAGNOSIS — F330 Major depressive disorder, recurrent, mild: Secondary | ICD-10-CM | POA: Diagnosis not present

## 2024-01-15 DIAGNOSIS — F411 Generalized anxiety disorder: Secondary | ICD-10-CM | POA: Diagnosis not present

## 2024-01-16 DIAGNOSIS — F411 Generalized anxiety disorder: Secondary | ICD-10-CM | POA: Diagnosis not present

## 2024-01-16 DIAGNOSIS — F330 Major depressive disorder, recurrent, mild: Secondary | ICD-10-CM | POA: Diagnosis not present

## 2024-01-21 ENCOUNTER — Telehealth: Payer: Self-pay | Admitting: Physician Assistant

## 2024-01-21 NOTE — Telephone Encounter (Signed)
 Patient called requesting visit for possible uti. No available appointments until Thursday 07/17 @ 3:00. She will go to an urgent care-[facility]

## 2024-01-28 ENCOUNTER — Other Ambulatory Visit: Payer: Self-pay | Admitting: Physician Assistant

## 2024-01-28 DIAGNOSIS — E782 Mixed hyperlipidemia: Secondary | ICD-10-CM

## 2024-02-05 DIAGNOSIS — L4050 Arthropathic psoriasis, unspecified: Secondary | ICD-10-CM | POA: Diagnosis not present

## 2024-02-05 DIAGNOSIS — Z79899 Other long term (current) drug therapy: Secondary | ICD-10-CM | POA: Diagnosis not present

## 2024-02-17 ENCOUNTER — Ambulatory Visit: Admitting: Physician Assistant

## 2024-02-25 DIAGNOSIS — F330 Major depressive disorder, recurrent, mild: Secondary | ICD-10-CM | POA: Diagnosis not present

## 2024-02-25 DIAGNOSIS — F411 Generalized anxiety disorder: Secondary | ICD-10-CM | POA: Diagnosis not present

## 2024-03-17 DIAGNOSIS — D2262 Melanocytic nevi of left upper limb, including shoulder: Secondary | ICD-10-CM | POA: Diagnosis not present

## 2024-03-17 DIAGNOSIS — D2261 Melanocytic nevi of right upper limb, including shoulder: Secondary | ICD-10-CM | POA: Diagnosis not present

## 2024-03-17 DIAGNOSIS — D225 Melanocytic nevi of trunk: Secondary | ICD-10-CM | POA: Diagnosis not present

## 2024-03-17 DIAGNOSIS — L400 Psoriasis vulgaris: Secondary | ICD-10-CM | POA: Diagnosis not present

## 2024-03-23 DIAGNOSIS — F331 Major depressive disorder, recurrent, moderate: Secondary | ICD-10-CM | POA: Diagnosis not present

## 2024-03-23 DIAGNOSIS — F411 Generalized anxiety disorder: Secondary | ICD-10-CM | POA: Diagnosis not present

## 2024-03-24 DIAGNOSIS — F330 Major depressive disorder, recurrent, mild: Secondary | ICD-10-CM | POA: Diagnosis not present

## 2024-03-24 DIAGNOSIS — F411 Generalized anxiety disorder: Secondary | ICD-10-CM | POA: Diagnosis not present

## 2024-03-31 DIAGNOSIS — F331 Major depressive disorder, recurrent, moderate: Secondary | ICD-10-CM | POA: Diagnosis not present

## 2024-03-31 DIAGNOSIS — F411 Generalized anxiety disorder: Secondary | ICD-10-CM | POA: Diagnosis not present

## 2024-04-02 DIAGNOSIS — H3562 Retinal hemorrhage, left eye: Secondary | ICD-10-CM | POA: Diagnosis not present

## 2024-04-03 ENCOUNTER — Ambulatory Visit (INDEPENDENT_AMBULATORY_CARE_PROVIDER_SITE_OTHER): Admitting: Nurse Practitioner

## 2024-04-03 ENCOUNTER — Encounter: Payer: Self-pay | Admitting: Nurse Practitioner

## 2024-04-03 ENCOUNTER — Telehealth: Payer: Self-pay | Admitting: Nurse Practitioner

## 2024-04-03 VITALS — BP 130/88 | HR 90 | Temp 97.4°F | Resp 16 | Ht 60.0 in | Wt 160.0 lb

## 2024-04-03 DIAGNOSIS — Z23 Encounter for immunization: Secondary | ICD-10-CM | POA: Diagnosis not present

## 2024-04-03 DIAGNOSIS — H3562 Retinal hemorrhage, left eye: Secondary | ICD-10-CM

## 2024-04-03 DIAGNOSIS — I6522 Occlusion and stenosis of left carotid artery: Secondary | ICD-10-CM | POA: Diagnosis not present

## 2024-04-03 NOTE — Progress Notes (Signed)
 [facility] [address] [place], [place] [address]  Internal MEDICINE  Office Visit Note  Patient Name: Kristine Stephens  [id number]  [id number]  Date of Service: 04/03/2024  Chief Complaint  Patient presents with   Acute Visit    Eye hemorrhage     HPI Kristine Stephens presents for an acute sick visit for  She went to her eye doctor and they found an abnormality and said she needs to have some labs and tests done. Reports that they found hemorrhages in the retina of the left eye. Recommended labs, ultrasound of the carotid and further testing but waiting for them to fax the report. Possible left carotid stenosis per optometry, needs carotid ultrasound.  Requesting flu vaccine     Current Medication:  Outpatient Encounter Medications as of 04/03/2024  Medication Sig   b complex vitamins capsule Take 1 capsule by mouth daily.   estradiol  (ESTRACE ) 0.1 MG/GM vaginal cream INSERT 1 APPLICATORFUL VAGINALLY 3 TIMES A WEEK   etanercept (ENBREL) 50 MG/ML injection Inject 50 mg into the skin once a week.   FLUoxetine  (PROZAC ) 40 MG capsule TAKE 1 CAPSULE(40 MG) BY MOUTH DAILY   folic acid  (FOLVITE ) 1 MG tablet Take 1 mg by mouth daily.   hydrOXYzine  (VISTARIL ) 25 MG capsule Take 1 capsule (25 mg total) by mouth every 8 (eight) hours as needed. For anxiety   leflunomide (ARAVA) 10 MG tablet Take 10 mg by mouth daily.   lisdexamfetamine (VYVANSE ) 30 MG capsule Take 1 capsule (30 mg total) by mouth daily.   magnesium oxide (MAG-OX) 400 (240 Mg) MG tablet Take 400 mg by mouth daily.   methotrexate (RHEUMATREX) 2.5 MG tablet Take 2.5 mg by mouth once a week. Caution:Chemotherapy. Protect from light.   omeprazole  (PRILOSEC) 40 MG capsule Take 1 capsule (40 mg total) by mouth daily.   rosuvastatin  (CRESTOR ) 5 MG tablet TAKE 1 TABLET(5 MG) BY MOUTH DAILY   tirzepatide  (MOUNJARO ) 5 MG/0.5ML Pen Inject 5 mg into the skin once a week.   No facility-administered encounter medications on file as  of 04/03/2024.      Medical History: Past Medical History:  Diagnosis Date   Anxiety    Depression    Diabetes mellitus without complication (HCC)    GERD (gastroesophageal reflux disease)    Psoriasis      Vital Signs: BP 130/88   Pulse 90   Temp (!) 97.4 F (36.3 C)   Resp 16   Ht 5' (1.524 m)   Wt 160 lb (72.6 kg)   SpO2 97%   BMI 31.25 kg/m    Review of Systems  Constitutional:  Negative for chills, fatigue and unexpected weight change.  HENT:  Negative for congestion, postnasal drip, rhinorrhea, sneezing and sore throat.   Eyes:  Negative for redness.  Respiratory: Negative.  Negative for cough, chest tightness, shortness of breath and wheezing.   Cardiovascular: Negative.  Negative for chest pain and palpitations.  Gastrointestinal:  Negative for abdominal pain, constipation, diarrhea, nausea and vomiting.  Genitourinary:  Negative for dysuria and frequency.  Musculoskeletal:  Negative for arthralgias, back pain, joint swelling and neck pain.  Skin:  Negative for rash.  Neurological: Negative.  Negative for tremors and numbness.  Hematological:  Negative for adenopathy. Does not bruise/bleed easily.  Psychiatric/Behavioral:  Negative for behavioral problems (Depression), sleep disturbance and suicidal ideas. The patient is not nervous/anxious.     Physical Exam Vitals reviewed.  Constitutional:      General: She  is not in acute distress.    Appearance: Normal appearance. She is obese. She is not ill-appearing.  HENT:     Head: Normocephalic and atraumatic.  Eyes:     General: Lids are normal. Vision grossly intact. Gaze aligned appropriately.  Cardiovascular:     Rate and Rhythm: Normal rate and regular rhythm.  Pulmonary:     Effort: Pulmonary effort is normal. No respiratory distress.  Neurological:     Mental Status: She is alert and oriented to person, place, and time.  Psychiatric:        Mood and Affect: Mood normal.        Behavior: Behavior  normal.       Assessment/Plan: 1. Retinal hemorrhage of left eye (Primary) CT ordered, carotid ultrasound ordered and referred to ophthalmology - CT ORBITS W WO CONTRAST; Future - US  Carotid Duplex Bilateral; Future - Ambulatory referral to Ophthalmology  2. Left carotid stenosis CT ordered, carotid ultrasound ordered and referred to ophthalmology - CT ORBITS W WO CONTRAST; Future - US  Carotid Duplex Bilateral; Future - Ambulatory referral to Ophthalmology  3. Flu vaccine need Flu vaccine administered in office today.  - Influenza, MDCK, trivalent, PF(Flucelvax egg-free)   General Counseling: Kristine Stephens verbalizes understanding of the findings of todays visit and agrees with plan of treatment. I have discussed any further diagnostic evaluation that may be needed or ordered today. We also reviewed her medications today. she has been encouraged to call the office with any questions or concerns that should arise related to todays visit.    Counseling:    Orders Placed This Encounter  Procedures   CT ORBITS W WO CONTRAST   US  Carotid Duplex Bilateral   Influenza, MDCK, trivalent, PF(Flucelvax egg-free)   Ambulatory referral to Ophthalmology    No orders of the defined types were placed in this encounter.   Return for previously scheduled, F/U with Lauren for follow up for  tests .  [place] Controlled Substance Database was reviewed by me for overdose risk score (ORS)  Time spent:30 Minutes Time spent with patient included reviewing progress notes, labs, imaging studies, and discussing plan for follow up.   This patient was seen by Mardy Maxin, FNP-C in collaboration with Dr. Sigrid Bathe as a part of collaborative care agreement.  Keghan Mcfarren R. Maxin, MSN, FNP-C Internal Medicine

## 2024-04-03 NOTE — Telephone Encounter (Signed)
 Urgent Ophthalmology referral sent via [facility] to [facility]. Notified patient. Gave telephone # [phone]

## 2024-04-06 DIAGNOSIS — H3500 Unspecified background retinopathy: Secondary | ICD-10-CM | POA: Diagnosis not present

## 2024-04-06 DIAGNOSIS — H2513 Age-related nuclear cataract, bilateral: Secondary | ICD-10-CM | POA: Diagnosis not present

## 2024-04-06 DIAGNOSIS — F331 Major depressive disorder, recurrent, moderate: Secondary | ICD-10-CM | POA: Diagnosis not present

## 2024-04-06 DIAGNOSIS — H16229 Keratoconjunctivitis sicca, not specified as Sjogren's, unspecified eye: Secondary | ICD-10-CM | POA: Diagnosis not present

## 2024-04-06 DIAGNOSIS — F411 Generalized anxiety disorder: Secondary | ICD-10-CM | POA: Diagnosis not present

## 2024-04-06 LAB — HM DIABETES EYE EXAM

## 2024-04-07 ENCOUNTER — Telehealth: Payer: Self-pay | Admitting: Nurse Practitioner

## 2024-04-07 NOTE — Telephone Encounter (Signed)
 Ophthalmology appointment 04/06/2024 with [facility]

## 2024-04-08 ENCOUNTER — Other Ambulatory Visit

## 2024-04-08 ENCOUNTER — Ambulatory Visit
Admission: RE | Admit: 2024-04-08 | Discharge: 2024-04-08 | Disposition: A | Discharge status: Home / Self Care | Source: Ambulatory Visit | Attending: Nurse Practitioner | Admitting: Nurse Practitioner

## 2024-04-08 DIAGNOSIS — H359 Unspecified retinal disorder: Secondary | ICD-10-CM | POA: Diagnosis not present

## 2024-04-08 DIAGNOSIS — H3562 Retinal hemorrhage, left eye: Secondary | ICD-10-CM

## 2024-04-08 DIAGNOSIS — I6522 Occlusion and stenosis of left carotid artery: Secondary | ICD-10-CM

## 2024-04-08 MED ORDER — IOPAMIDOL (ISOVUE-300) INJECTION 61%
75.0000 mL | Freq: Once | INTRAVENOUS | Status: AC | PRN
Start: 2024-04-08 — End: 2024-04-08
  Administered 2024-04-08: 75 mL via INTRAVENOUS

## 2024-04-10 ENCOUNTER — Ambulatory Visit
Admission: RE | Admit: 2024-04-10 | Discharge: 2024-04-10 | Disposition: A | Discharge status: Home / Self Care | Source: Ambulatory Visit | Attending: Nurse Practitioner | Admitting: Nurse Practitioner

## 2024-04-10 DIAGNOSIS — I6522 Occlusion and stenosis of left carotid artery: Secondary | ICD-10-CM

## 2024-04-10 DIAGNOSIS — H3562 Retinal hemorrhage, left eye: Secondary | ICD-10-CM

## 2024-04-14 DIAGNOSIS — F411 Generalized anxiety disorder: Secondary | ICD-10-CM | POA: Diagnosis not present

## 2024-04-14 DIAGNOSIS — F331 Major depressive disorder, recurrent, moderate: Secondary | ICD-10-CM | POA: Diagnosis not present

## 2024-04-16 ENCOUNTER — Encounter: Payer: Self-pay | Admitting: Physician Assistant

## 2024-04-16 ENCOUNTER — Ambulatory Visit: Admitting: Physician Assistant

## 2024-04-16 VITALS — BP 130/80 | HR 88 | Temp 97.8°F | Resp 16 | Ht 60.0 in | Wt 160.0 lb

## 2024-04-16 DIAGNOSIS — E1165 Type 2 diabetes mellitus with hyperglycemia: Secondary | ICD-10-CM

## 2024-04-16 DIAGNOSIS — E13319 Other specified diabetes mellitus with unspecified diabetic retinopathy without macular edema: Secondary | ICD-10-CM | POA: Diagnosis not present

## 2024-04-16 DIAGNOSIS — E66811 Obesity, class 1: Secondary | ICD-10-CM

## 2024-04-16 DIAGNOSIS — E119 Type 2 diabetes mellitus without complications: Secondary | ICD-10-CM

## 2024-04-16 DIAGNOSIS — H3562 Retinal hemorrhage, left eye: Secondary | ICD-10-CM | POA: Diagnosis not present

## 2024-04-16 NOTE — Progress Notes (Signed)
 [facility] [address] [place], [place] [address]  Internal MEDICINE  Office Visit Note  Patient Name: Kristine Stephens  [id number]  [id number]  Date of Service: 04/16/2024  Chief Complaint  Patient presents with   Follow-up   Diabetes   Depression   Anxiety    HPI Pt is here for routine follow up -Mounjaro  causing stomach upset, worse around time of shot. But also states she will sometimes miss a week. -down 4lbs since June visit -went to her eye doctor due to retinal hemorrhages and was told to have CT orbits and carotid US  which was ordered at visit on 04/03/24 with Mardy Maxin, FNP-C -She was then seen by [facility] and had additional testing, will be following up with them. Per document sent over it does appear diabetic retinopathy was seen on the left and they will be following up on this.   -will hold off on mounjaro  for now due to potential concern for retinopathy impact -She has already been holding vyvanse  and will continue to do so  -Also verified that patient informed ophthalmology of her current health conditions and medications including her rheumatologic hx -reviewed CT Orbits w contrast as below: IMPRESSION:  1. No acute orbital abnormality.  2. No visible hemorrhage within the left globe.  -Reviewed carotid US  as below: IMPRESSION: No significant stenosis in the extracranial carotid arteries bilaterally. -US  did mention smooth soft plaque on left carotid but no significant stenosis. Patient is on crestor   Current Medication: Outpatient Encounter Medications as of 04/16/2024  Medication Sig   b complex vitamins capsule Take 1 capsule by mouth daily.   estradiol  (ESTRACE ) 0.1 MG/GM vaginal cream INSERT 1 APPLICATORFUL VAGINALLY 3 TIMES A WEEK   etanercept (ENBREL) 50 MG/ML injection Inject 50 mg into the skin once a week.   FLUoxetine  (PROZAC ) 40 MG capsule TAKE 1 CAPSULE(40 MG) BY MOUTH DAILY   folic acid  (FOLVITE ) 1 MG tablet Take 1 mg by  mouth daily.   hydrOXYzine  (VISTARIL ) 25 MG capsule Take 1 capsule (25 mg total) by mouth every 8 (eight) hours as needed. For anxiety   magnesium oxide (MAG-OX) 400 (240 Mg) MG tablet Take 400 mg by mouth daily.   methotrexate (RHEUMATREX) 2.5 MG tablet Take 2.5 mg by mouth once a week. Caution:Chemotherapy. Protect from light.   omeprazole  (PRILOSEC) 40 MG capsule Take 1 capsule (40 mg total) by mouth daily.   rosuvastatin  (CRESTOR ) 5 MG tablet TAKE 1 TABLET(5 MG) BY MOUTH DAILY   [DISCONTINUED] lisdexamfetamine (VYVANSE ) 30 MG capsule Take 1 capsule (30 mg total) by mouth daily.   [DISCONTINUED] tirzepatide  (MOUNJARO ) 5 MG/0.5ML Pen Inject 5 mg into the skin once a week.   [DISCONTINUED] leflunomide (ARAVA) 10 MG tablet Take 10 mg by mouth daily.   No facility-administered encounter medications on file as of 04/16/2024.    Surgical History: Past Surgical History:  Procedure Laterality Date   ASD REPAIR  1985   CARPAL TUNNEL RELEASE     CESAREAN SECTION     COLONOSCOPY WITH PROPOFOL  N/A 04/11/2017   Procedure: COLONOSCOPY WITH PROPOFOL ;  Surgeon: Therisa Bi, MD;  Location: [facility] ENDOSCOPY;  Service: Gastroenterology;  Laterality: N/A;   COLPOSCOPY  2012   ENDOMETRIAL ABLATION     Failed ablation x2 (Tech difficulties)   LAPAROSCOPIC VAGINAL HYSTERECTOMY WITH SALPINGO OOPHORECTOMY  2012   Adenomyosis for AUB   TONSILLECTOMY     TUBAL LIGATION      Medical History: Past Medical History:  Diagnosis Date   Anxiety    Depression    Diabetes mellitus without complication (HCC)    GERD (gastroesophageal reflux disease)    Psoriasis     Family History: Family History  Problem Relation Age of Onset   Breast cancer Paternal Grandmother 45   Multiple myeloma Father    Thyroid  disease Paternal Aunt     Social History   Socioeconomic History   Marital status: Married    Spouse name: Not on file   Number of children: Not on file   Years of education: Not on file   Highest  education level: Not on file  Occupational History   Not on file  Tobacco Use   Smoking status: Never   Smokeless tobacco: Never  Vaping Use   Vaping status: Never Used  Substance and Sexual Activity   Alcohol use: No   Drug use: No   Sexual activity: Yes    Partners: Male    Birth control/protection: None  Other Topics Concern   Not on file  Social History Narrative   Not on file   Social Drivers of Health   Financial Resource Strain: Not on file  Food Insecurity: Not on file  Transportation Needs: Not on file  Physical Activity: Not on file  Stress: Not on file  Social Connections: Not on file  Intimate Partner Violence: Not on file      Review of Systems  Constitutional:  Negative for chills, fatigue and unexpected weight change.  HENT:  Negative for congestion, rhinorrhea, sneezing and sore throat.   Respiratory: Negative.  Negative for cough, chest tightness and shortness of breath.   Cardiovascular: Negative.  Negative for chest pain and palpitations.  Gastrointestinal:  Negative for abdominal pain, constipation, diarrhea, nausea and vomiting.  Musculoskeletal:  Positive for arthralgias. Negative for back pain, joint swelling and neck pain.  Skin:  Negative for rash.  Neurological: Negative.  Negative for tremors and numbness.  Psychiatric/Behavioral:  Positive for dysphoric mood. Negative for behavioral problems (Depression), sleep disturbance and suicidal ideas. The patient is nervous/anxious.     Vital Signs: BP 130/80   Pulse 88   Temp 97.8 F (36.6 C)   Resp 16   Ht 5' (1.524 m)   Wt 160 lb (72.6 kg)   SpO2 98%   BMI 31.25 kg/m    Physical Exam Vitals reviewed.  Constitutional:      General: She is not in acute distress.    Appearance: Normal appearance. She is obese. She is not ill-appearing.  HENT:     Head: Normocephalic and atraumatic.  Eyes:     General: Lids are normal. Vision grossly intact. Gaze aligned appropriately.   Cardiovascular:     Rate and Rhythm: Normal rate and regular rhythm.  Pulmonary:     Effort: Pulmonary effort is normal. No respiratory distress.  Skin:    General: Skin is warm and dry.  Neurological:     Mental Status: She is alert and oriented to person, place, and time.  Psychiatric:        Mood and Affect: Mood normal.        Behavior: Behavior normal.        Assessment/Plan: 1. Diabetic retinopathy of left eye associated with diabetes mellitus of other type, macular edema presence unspecified, unspecified retinopathy severity (HCC) (Primary) Noted on [facility] report scanned in chart and will have routine monitoring with them. Will continue to monitor sugars, but will discontinue mounjaro  as it could  be a possible contributor   2. Retinal hemorrhage of left eye Possibly related to diabetic retinopathy finding and will follow up with ophthalmology. Carotid US  and CT orbit unrevealing. Mounjaro  d/c  3. Type 2 diabetes mellitus without complication, without long-term current use of insulin (HCC) Will monitor BG with discontinuation of mounjaro   4. Obesity (BMI 30.0-34.9) Down 4 lbs since last visit and encouraged to continue to work on this   General Counseling: Myalynn verbalizes understanding of the findings of todays visit and agrees with plan of treatment. I have discussed any further diagnostic evaluation that may be needed or ordered today. We also reviewed her medications today. she has been encouraged to call the office with any questions or concerns that should arise related to todays visit.    No orders of the defined types were placed in this encounter.   No orders of the defined types were placed in this encounter.   This patient was seen by Tinnie Pro, PA-C in collaboration with Dr. Sigrid Bathe as a part of collaborative care agreement.   Total time spent:30 Minutes Time spent includes review of chart, medications, test results, and follow up plan  with the patient.      Dr Fozia M Khan Internal medicine

## 2024-04-27 DIAGNOSIS — H16229 Keratoconjunctivitis sicca, not specified as Sjogren's, unspecified eye: Secondary | ICD-10-CM | POA: Diagnosis not present

## 2024-04-27 DIAGNOSIS — E113392 Type 2 diabetes mellitus with moderate nonproliferative diabetic retinopathy without macular edema, left eye: Secondary | ICD-10-CM | POA: Diagnosis not present

## 2024-04-27 DIAGNOSIS — H2513 Age-related nuclear cataract, bilateral: Secondary | ICD-10-CM | POA: Diagnosis not present

## 2024-04-29 DIAGNOSIS — F331 Major depressive disorder, recurrent, moderate: Secondary | ICD-10-CM | POA: Diagnosis not present

## 2024-04-29 DIAGNOSIS — F411 Generalized anxiety disorder: Secondary | ICD-10-CM | POA: Diagnosis not present

## 2024-05-12 ENCOUNTER — Ambulatory Visit: Payer: Self-pay | Admitting: Internal Medicine

## 2024-05-21 DIAGNOSIS — F411 Generalized anxiety disorder: Secondary | ICD-10-CM | POA: Diagnosis not present

## 2024-05-21 DIAGNOSIS — F330 Major depressive disorder, recurrent, mild: Secondary | ICD-10-CM | POA: Diagnosis not present

## 2024-05-22 DIAGNOSIS — F411 Generalized anxiety disorder: Secondary | ICD-10-CM | POA: Diagnosis not present

## 2024-05-22 DIAGNOSIS — F331 Major depressive disorder, recurrent, moderate: Secondary | ICD-10-CM | POA: Diagnosis not present

## 2024-06-11 DIAGNOSIS — L4050 Arthropathic psoriasis, unspecified: Secondary | ICD-10-CM | POA: Diagnosis not present

## 2024-06-17 DIAGNOSIS — F411 Generalized anxiety disorder: Secondary | ICD-10-CM | POA: Diagnosis not present

## 2024-06-17 DIAGNOSIS — F331 Major depressive disorder, recurrent, moderate: Secondary | ICD-10-CM | POA: Diagnosis not present

## 2024-06-18 ENCOUNTER — Ambulatory Visit: Admitting: Physician Assistant

## 2024-07-16 ENCOUNTER — Ambulatory Visit: Admitting: Physician Assistant

## 2024-07-16 ENCOUNTER — Other Ambulatory Visit: Payer: Self-pay

## 2024-07-16 ENCOUNTER — Encounter: Payer: Self-pay | Admitting: Physician Assistant

## 2024-07-16 VITALS — BP 121/68 | HR 88 | Temp 98.0°F | Resp 16 | Ht 60.0 in | Wt 163.8 lb

## 2024-07-16 DIAGNOSIS — E1165 Type 2 diabetes mellitus with hyperglycemia: Secondary | ICD-10-CM

## 2024-07-16 DIAGNOSIS — E559 Vitamin D deficiency, unspecified: Secondary | ICD-10-CM

## 2024-07-16 DIAGNOSIS — Z1329 Encounter for screening for other suspected endocrine disorder: Secondary | ICD-10-CM

## 2024-07-16 DIAGNOSIS — E782 Mixed hyperlipidemia: Secondary | ICD-10-CM | POA: Diagnosis not present

## 2024-07-16 DIAGNOSIS — F988 Other specified behavioral and emotional disorders with onset usually occurring in childhood and adolescence: Secondary | ICD-10-CM

## 2024-07-16 DIAGNOSIS — R5383 Other fatigue: Secondary | ICD-10-CM | POA: Diagnosis not present

## 2024-07-16 DIAGNOSIS — Z1231 Encounter for screening mammogram for malignant neoplasm of breast: Secondary | ICD-10-CM | POA: Diagnosis not present

## 2024-07-16 LAB — POCT GLYCOSYLATED HEMOGLOBIN (HGB A1C): Hemoglobin A1C: 6.5 % — AB (ref 4.0–5.6)

## 2024-07-16 MED ORDER — ACCU-CHEK SOFTCLIX LANCETS MISC: 3 refills | Status: AC

## 2024-07-16 MED ORDER — ACCU-CHEK GUIDE ME W/DEVICE KIT: PACK | 0 refills | Status: AC

## 2024-07-16 MED ORDER — LISDEXAMFETAMINE DIMESYLATE 30 MG PO CAPS: 30.0000 mg | ORAL_CAPSULE | Freq: Every day | ORAL | 0 refills | Status: AC

## 2024-07-16 MED ORDER — ACCU-CHEK GUIDE TEST VI STRP: ORAL_STRIP | 3 refills | Status: AC

## 2024-07-16 MED ORDER — EMPAGLIFLOZIN 10 MG PO TABS: 10.0000 mg | ORAL_TABLET | Freq: Every day | ORAL | 3 refills | Status: AC

## 2024-07-16 NOTE — Progress Notes (Signed)
 [facility] [address] [place], [place] [address]  Internal MEDICINE  Office Visit Note  Patient Name: Kristine Stephens  [id number]  [id number]  Date of Service: 07/16/2024  Chief Complaint  Patient presents with   Follow-up   Diabetes    HPI Pt is here for routine follow up -will be seeing eye doctor on 1/22 to recheck after finding retinopathy and hx of retinal hemorrhages as well -mounjaro  was stopped last visit and she held her vyvanse  as well -would like to take something else for her diabetes -new grandbaby living with her so keeping her busy, 87 weeks old named Winnie. A lot going on at home though with daughter living with her too. Also husband has been sick. -psychiatry is sending her to have formal ADHD testing. Does notice big difference off vyvanse  and would like to restart but aware she cannot take this during testing and they will takeover/adjust meds -states she is now on 40mg  cymbalta  with 40mg  prozac , also seeing therapist  Current Medication: Outpatient Encounter Medications as of 07/16/2024  Medication Sig   DULoxetine  (CYMBALTA ) 20 MG capsule Take 40 mg by mouth daily.   empagliflozin  (JARDIANCE ) 10 MG TABS tablet Take 1 tablet (10 mg total) by mouth daily.   lisdexamfetamine  (VYVANSE ) 30 MG capsule Take 1 capsule (30 mg total) by mouth daily.   b complex vitamins capsule Take 1 capsule by mouth daily.   estradiol  (ESTRACE ) 0.1 MG/GM vaginal cream INSERT 1 APPLICATORFUL VAGINALLY 3 TIMES A WEEK   etanercept (ENBREL) 50 MG/ML injection Inject 50 mg into the skin once a week.   FLUoxetine  (PROZAC ) 40 MG capsule TAKE 1 CAPSULE(40 MG) BY MOUTH DAILY   folic acid  (FOLVITE ) 1 MG tablet Take 1 mg by mouth daily.   hydrOXYzine  (VISTARIL ) 25 MG capsule Take 1 capsule (25 mg total) by mouth every 8 (eight) hours as needed. For anxiety   magnesium oxide (MAG-OX) 400 (240 Mg) MG tablet Take 400 mg by mouth daily.   methotrexate (RHEUMATREX) 2.5 MG tablet Take 2.5  mg by mouth once a week. Caution:Chemotherapy. Protect from light.   omeprazole  (PRILOSEC) 40 MG capsule Take 1 capsule (40 mg total) by mouth daily.   rosuvastatin  (CRESTOR ) 5 MG tablet TAKE 1 TABLET(5 MG) BY MOUTH DAILY   No facility-administered encounter medications on file as of 07/16/2024.    Surgical History: Past Surgical History:  Procedure Laterality Date   ASD REPAIR  1985   CARPAL TUNNEL RELEASE     CESAREAN SECTION     COLONOSCOPY WITH PROPOFOL  N/A 04/11/2017   Procedure: COLONOSCOPY WITH PROPOFOL ;  Surgeon: Therisa Bi, MD;  Location: [facility] ENDOSCOPY;  Service: Gastroenterology;  Laterality: N/A;   COLPOSCOPY  2012   ENDOMETRIAL ABLATION     Failed ablation x2 (Tech difficulties)   LAPAROSCOPIC VAGINAL HYSTERECTOMY WITH SALPINGO OOPHORECTOMY  2012   Adenomyosis for AUB   TONSILLECTOMY     TUBAL LIGATION      Medical History: Past Medical History:  Diagnosis Date   Anxiety    Depression    Diabetes mellitus without complication (HCC)    GERD (gastroesophageal reflux disease)    Psoriasis     Family History: Family History  Problem Relation Age of Onset   Breast cancer Paternal Grandmother 16   Multiple myeloma Father    Thyroid  disease Paternal Aunt     Social History   Socioeconomic History   Marital status: Married    Spouse name: Not on file  Number of children: Not on file   Years of education: Not on file   Highest education level: Not on file  Occupational History   Not on file  Tobacco Use   Smoking status: Never   Smokeless tobacco: Never  Vaping Use   Vaping status: Never Used  Substance and Sexual Activity   Alcohol use: No   Drug use: No   Sexual activity: Yes    Partners: Male    Birth control/protection: None  Other Topics Concern   Not on file  Social History Narrative   Not on file   Social Drivers of Health   Tobacco Use: Low Risk (07/16/2024)   Patient History    Smoking Tobacco Use: Never    Smokeless Tobacco Use:  Never    Passive Exposure: Not on file  Financial Resource Strain: Not on file  Food Insecurity: Not on file  Transportation Needs: Not on file  Physical Activity: Not on file  Stress: Not on file  Social Connections: Not on file  Intimate Partner Violence: Not on file  Depression (PHQ2-9): Low Risk (04/16/2024)   Depression (PHQ2-9)    PHQ-2 Score: 0  Alcohol Screen: Not on file  Housing: Unknown (08/05/2023)   Received from [facility]   Epic    Unable to Pay for Housing in the Last Year: Not on file    Number of Times Moved in the Last Year: Not on file    At any time in the past 12 months, were you homeless or living in a shelter (including now)?: No  Utilities: Not on file  Health Literacy: Not on file      Review of Systems  Constitutional:  Negative for chills, fatigue and unexpected weight change.  HENT:  Negative for congestion, rhinorrhea, sneezing and sore throat.   Respiratory: Negative.  Negative for cough, chest tightness and shortness of breath.   Cardiovascular: Negative.  Negative for chest pain and palpitations.  Gastrointestinal:  Negative for abdominal pain, constipation, diarrhea, nausea and vomiting.  Musculoskeletal:  Positive for arthralgias. Negative for back pain, joint swelling and neck pain.  Skin:  Negative for rash.  Neurological: Negative.  Negative for tremors and numbness.  Psychiatric/Behavioral:  Positive for dysphoric mood. Negative for behavioral problems (Depression), sleep disturbance and suicidal ideas. The patient is nervous/anxious.     Vital Signs: BP 121/68   Pulse 88   Temp 98 F (36.7 C)   Resp 16   Ht 5' (1.524 m)   Wt 163 lb 12.8 oz (74.3 kg)   SpO2 98%   BMI 31.99 kg/m    Physical Exam Vitals reviewed.  Constitutional:      General: She is not in acute distress.    Appearance: Normal appearance. She is obese. She is not ill-appearing.  HENT:     Head: Normocephalic and atraumatic.  Eyes:      General: Lids are normal. Vision grossly intact. Gaze aligned appropriately.     Extraocular Movements: Extraocular movements intact.  Cardiovascular:     Rate and Rhythm: Normal rate and regular rhythm.  Pulmonary:     Effort: Pulmonary effort is normal. No respiratory distress.  Skin:    General: Skin is warm and dry.  Neurological:     Mental Status: She is alert and oriented to person, place, and time.  Psychiatric:        Mood and Affect: Mood normal.        Behavior: Behavior normal.  Assessment/Plan: 1. Type 2 diabetes mellitus with hyperglycemia, without long-term current use of insulin (HCC) (Primary) - POCT HgB A1C is 6.5 which is up from 5.9 last visit since stopping mounjaro . Will start Jardiance  - Urine Microalbumin w/creat. ratio - empagliflozin  (JARDIANCE ) 10 MG TABS tablet; Take 1 tablet (10 mg total) by mouth daily.  Dispense: 90 tablet; Refill: 3  2. Attention deficit disorder (ADD) in adult - lisdexamfetamine  (VYVANSE ) 30 MG capsule; Take 1 capsule (30 mg total) by mouth daily.  Dispense: 30 capsule; Refill: 0  3. Encounter for screening mammogram for malignant neoplasm of breast - MM 3D SCREENING MAMMOGRAM BILATERAL BREAST; Future  4. Vitamin D  deficiency - VITAMIN D  25 Hydroxy (Vit-D Deficiency, Fractures)  5. Thyroid  disorder screen - TSH + free T4  6. Mixed hyperlipidemia - Lipid Panel With LDL/HDL Ratio  7. Other fatigue - Comprehensive metabolic panel with GFR - TSH + free T4 - Lipid Panel With LDL/HDL Ratio - VITAMIN D  25 Hydroxy (Vit-D Deficiency, Fractures)   General Counseling: Michaelia verbalizes understanding of the findings of todays visit and agrees with plan of treatment. I have discussed any further diagnostic evaluation that may be needed or ordered today. We also reviewed her medications today. she has been encouraged to call the office with any questions or concerns that should arise related to todays visit.    Orders Placed  This Encounter  Procedures   MM 3D SCREENING MAMMOGRAM BILATERAL BREAST   Urine Microalbumin w/creat. ratio   Comprehensive metabolic panel with GFR   TSH + free T4   Lipid Panel With LDL/HDL Ratio   VITAMIN D  25 Hydroxy (Vit-D Deficiency, Fractures)   POCT HgB A1C    Meds ordered this encounter  Medications   empagliflozin  (JARDIANCE ) 10 MG TABS tablet    Sig: Take 1 tablet (10 mg total) by mouth daily.    Dispense:  90 tablet    Refill:  3   lisdexamfetamine  (VYVANSE ) 30 MG capsule    Sig: Take 1 capsule (30 mg total) by mouth daily.    Dispense:  30 capsule    Refill:  0    This patient was seen by Tinnie Pro, PA-C in collaboration with Dr. Sigrid Bathe as a part of collaborative care agreement.   Total time spent:30 Minutes Time spent includes review of chart, medications, test results, and follow up plan with the patient.      Dr Fozia M Khan Internal medicine

## 2024-07-17 LAB — MICROALBUMIN / CREATININE URINE RATIO
Creatinine, Urine: 89.8 mg/dL
Microalb/Creat Ratio: 7 mg/g{creat} (ref 0–29)
Microalbumin, Urine: 6.3 ug/mL

## 2024-09-25 ENCOUNTER — Encounter

## 2025-01-06 ENCOUNTER — Encounter: Admitting: Physician Assistant
